# Patient Record
Sex: Female | Born: 1935
Health system: Southern US, Community
[De-identification: ages and names within clinical notes are randomized; demographics above are authoritative.]

## PROBLEM LIST (undated history)

## (undated) DIAGNOSIS — G459 Transient cerebral ischemic attack, unspecified: Secondary | ICD-10-CM

## (undated) DIAGNOSIS — K219 Gastro-esophageal reflux disease without esophagitis: Secondary | ICD-10-CM

## (undated) DIAGNOSIS — E785 Hyperlipidemia, unspecified: Secondary | ICD-10-CM

## (undated) DIAGNOSIS — I10 Essential (primary) hypertension: Secondary | ICD-10-CM

## (undated) HISTORY — PX: ABDOMINAL HYSTERECTOMY: SHX81

## (undated) HISTORY — PX: TONSILLECTOMY: SUR1361

---

## 1999-07-26 ENCOUNTER — Ambulatory Visit (HOSPITAL_COMMUNITY): Admission: RE | Admit: 1999-07-26 | Discharge: 1999-07-26 | Payer: Self-pay | Admitting: Family Medicine

## 1999-10-20 ENCOUNTER — Emergency Department (HOSPITAL_COMMUNITY): Admission: EM | Admit: 1999-10-20 | Discharge: 1999-10-20 | Payer: Self-pay

## 2001-09-10 ENCOUNTER — Encounter: Payer: Self-pay | Admitting: Family Medicine

## 2001-09-10 ENCOUNTER — Encounter: Admission: RE | Admit: 2001-09-10 | Discharge: 2001-09-10 | Payer: Self-pay | Admitting: Family Medicine

## 2005-06-05 ENCOUNTER — Encounter: Admission: RE | Admit: 2005-06-05 | Discharge: 2005-06-05 | Payer: Self-pay | Admitting: Family Medicine

## 2007-12-23 ENCOUNTER — Encounter: Admission: RE | Admit: 2007-12-23 | Discharge: 2007-12-23 | Payer: Self-pay | Admitting: Family Medicine

## 2009-05-05 ENCOUNTER — Ambulatory Visit: Payer: Self-pay | Admitting: Vascular Surgery

## 2011-04-11 NOTE — Consult Note (Signed)
NEW PATIENT CONSULTATION   Jill Hernandez, Jill Hernandez  DOB:  [birth year]                                       05/05/2009  CHART#:[id number]   Patient presents today for evaluation of lower extremity pathology.  She  is a very pleasant, active 77 year old white female who has several  components of lower extremity with guideposts.  She reports this is  bilateral but is more significant on her left than her right.  Her  complaints are mainly of numbness and tingling, more so in her left foot  than her right, and also a cold sensation in her left foot.  She does  not have any significant swelling.  She does have discomfort in her  calves at rest.  She reports that the numbness and tingling is in her  calf distally and can awaken her at night.   PAST MEDICAL HISTORY:  1. Hypertension.  2. Hyperlipidemia.  3. History of anemia.  4. Vitamin D deficiency.  5. Osteopenia.  6. Gastroesophageal reflux disease.  7. Osteoarthritis in her knees.   SOCIAL HISTORY:  She is widowed.  She has 3 children.  She does not  smoke, having quit 40 years ago, and she does drink wine with her  evening meal.   REVIEW OF SYSTEMS:  Otherwise negative other than above.   CURRENT MEDICATIONS:  1. Lisinopril.  2. Protonix.  3. Lipitor.  4. Tylenol.  5. Aspirin.  6. Multivitamin.  7. Boniva.   PHYSICAL EXAMINATION:  Blood pressure 144/76, pulse 88, respirations 18.  Her radial and dorsalis pedis pulses are 2+ bilaterally.  She does not  have any significant swelling in her lower extremities.  She does have  marked spider vein telangiectasia bilaterally, mostly on her thigh but  more so on her calves down onto her ankles.   She underwent a screening venous duplex by myself, and this did show  normal-caliber saphenous veins in her thighs.  She does have a lot of  early branching pattern with no significant varicosities in her  saphenous vein below her knees.   I discussed this at length  with the patient.  I explained that I do not  think her numbness is related to arterial or venous pathology.  I  explained that she does have normal arterial flow.  She does have some  inconvenience related to the coldness, and she has treated this  effectively with avoiding the cold and wearing warm socks.  I explained  that this is related to flow to the skin and not any correctable  arterial blockage, and she has normal arterial flow.  She was reassured  with this discussion and will see [place] again on an as-needed basis.   Larina Earthly, M.D.  Electronically Signed   TFE/MEDQ  D:  05/05/2009  T:  05/05/2009  Job:  [id number]   cc:   Addison Lank, NP at [facility]

## 2012-06-17 DIAGNOSIS — Z1331 Encounter for screening for depression: Secondary | ICD-10-CM | POA: Diagnosis not present

## 2012-06-17 DIAGNOSIS — E782 Mixed hyperlipidemia: Secondary | ICD-10-CM | POA: Diagnosis not present

## 2012-06-17 DIAGNOSIS — I10 Essential (primary) hypertension: Secondary | ICD-10-CM | POA: Diagnosis not present

## 2012-06-17 DIAGNOSIS — K219 Gastro-esophageal reflux disease without esophagitis: Secondary | ICD-10-CM | POA: Diagnosis not present

## 2012-06-17 DIAGNOSIS — M159 Polyosteoarthritis, unspecified: Secondary | ICD-10-CM | POA: Diagnosis not present

## 2012-08-16 DIAGNOSIS — R209 Unspecified disturbances of skin sensation: Secondary | ICD-10-CM | POA: Diagnosis not present

## 2012-09-04 DIAGNOSIS — Z23 Encounter for immunization: Secondary | ICD-10-CM | POA: Diagnosis not present

## 2013-02-17 DIAGNOSIS — N183 Chronic kidney disease, stage 3 unspecified: Secondary | ICD-10-CM | POA: Diagnosis not present

## 2013-02-17 DIAGNOSIS — E782 Mixed hyperlipidemia: Secondary | ICD-10-CM | POA: Diagnosis not present

## 2013-02-17 DIAGNOSIS — I10 Essential (primary) hypertension: Secondary | ICD-10-CM | POA: Diagnosis not present

## 2013-02-17 DIAGNOSIS — K219 Gastro-esophageal reflux disease without esophagitis: Secondary | ICD-10-CM | POA: Diagnosis not present

## 2013-08-14 DIAGNOSIS — Z23 Encounter for immunization: Secondary | ICD-10-CM | POA: Diagnosis not present

## 2013-08-18 DIAGNOSIS — K219 Gastro-esophageal reflux disease without esophagitis: Secondary | ICD-10-CM | POA: Diagnosis not present

## 2013-08-18 DIAGNOSIS — I10 Essential (primary) hypertension: Secondary | ICD-10-CM | POA: Diagnosis not present

## 2013-08-18 DIAGNOSIS — N183 Chronic kidney disease, stage 3 unspecified: Secondary | ICD-10-CM | POA: Diagnosis not present

## 2014-02-12 DIAGNOSIS — I10 Essential (primary) hypertension: Secondary | ICD-10-CM | POA: Diagnosis not present

## 2014-02-12 DIAGNOSIS — Z23 Encounter for immunization: Secondary | ICD-10-CM | POA: Diagnosis not present

## 2014-02-12 DIAGNOSIS — R7301 Impaired fasting glucose: Secondary | ICD-10-CM | POA: Diagnosis not present

## 2014-02-12 DIAGNOSIS — H00019 Hordeolum externum unspecified eye, unspecified eyelid: Secondary | ICD-10-CM | POA: Diagnosis not present

## 2014-02-12 DIAGNOSIS — E782 Mixed hyperlipidemia: Secondary | ICD-10-CM | POA: Diagnosis not present

## 2014-04-29 ENCOUNTER — Emergency Department (HOSPITAL_COMMUNITY): Payer: Medicare Other

## 2014-04-29 ENCOUNTER — Inpatient Hospital Stay (HOSPITAL_COMMUNITY): Payer: Medicare Other

## 2014-04-29 ENCOUNTER — Encounter (HOSPITAL_COMMUNITY): Payer: Self-pay | Admitting: Emergency Medicine

## 2014-04-29 ENCOUNTER — Inpatient Hospital Stay (HOSPITAL_COMMUNITY)
Admission: EM | Admit: 2014-04-29 | Discharge: 2014-05-01 | DRG: 312 | Disposition: A | Discharge status: Home / Self Care | Payer: Medicare Other | Attending: Internal Medicine | Admitting: Internal Medicine

## 2014-04-29 DIAGNOSIS — Z79899 Other long term (current) drug therapy: Secondary | ICD-10-CM | POA: Diagnosis not present

## 2014-04-29 DIAGNOSIS — M25569 Pain in unspecified knee: Secondary | ICD-10-CM

## 2014-04-29 DIAGNOSIS — S298XXA Other specified injuries of thorax, initial encounter: Secondary | ICD-10-CM | POA: Diagnosis not present

## 2014-04-29 DIAGNOSIS — R55 Syncope and collapse: Secondary | ICD-10-CM | POA: Diagnosis not present

## 2014-04-29 DIAGNOSIS — S8000XA Contusion of unspecified knee, initial encounter: Secondary | ICD-10-CM

## 2014-04-29 DIAGNOSIS — K219 Gastro-esophageal reflux disease without esophagitis: Secondary | ICD-10-CM | POA: Diagnosis present

## 2014-04-29 DIAGNOSIS — W19XXXA Unspecified fall, initial encounter: Secondary | ICD-10-CM | POA: Diagnosis present

## 2014-04-29 DIAGNOSIS — R404 Transient alteration of awareness: Secondary | ICD-10-CM | POA: Diagnosis not present

## 2014-04-29 DIAGNOSIS — E785 Hyperlipidemia, unspecified: Secondary | ICD-10-CM | POA: Diagnosis present

## 2014-04-29 DIAGNOSIS — R42 Dizziness and giddiness: Secondary | ICD-10-CM | POA: Diagnosis not present

## 2014-04-29 DIAGNOSIS — Z87891 Personal history of nicotine dependence: Secondary | ICD-10-CM | POA: Diagnosis not present

## 2014-04-29 DIAGNOSIS — G459 Transient cerebral ischemic attack, unspecified: Secondary | ICD-10-CM

## 2014-04-29 DIAGNOSIS — I10 Essential (primary) hypertension: Secondary | ICD-10-CM | POA: Diagnosis present

## 2014-04-29 DIAGNOSIS — S8990XA Unspecified injury of unspecified lower leg, initial encounter: Secondary | ICD-10-CM | POA: Diagnosis not present

## 2014-04-29 DIAGNOSIS — S8002XA Contusion of left knee, initial encounter: Secondary | ICD-10-CM

## 2014-04-29 DIAGNOSIS — S0990XA Unspecified injury of head, initial encounter: Secondary | ICD-10-CM | POA: Diagnosis not present

## 2014-04-29 DIAGNOSIS — S99919A Unspecified injury of unspecified ankle, initial encounter: Secondary | ICD-10-CM | POA: Diagnosis not present

## 2014-04-29 DIAGNOSIS — M25562 Pain in left knee: Secondary | ICD-10-CM | POA: Diagnosis present

## 2014-04-29 DIAGNOSIS — R079 Chest pain, unspecified: Secondary | ICD-10-CM | POA: Diagnosis not present

## 2014-04-29 HISTORY — DX: Gastro-esophageal reflux disease without esophagitis: K21.9

## 2014-04-29 HISTORY — DX: Essential (primary) hypertension: I10

## 2014-04-29 HISTORY — DX: Hyperlipidemia, unspecified: E78.5

## 2014-04-29 LAB — CBC WITH DIFFERENTIAL/PLATELET
Basophils Absolute: 0 10*3/uL (ref 0.0–0.1)
Basophils Relative: 0 % (ref 0–1)
EOS PCT: 1 % (ref 0–5)
Eosinophils Absolute: 0.1 10*3/uL (ref 0.0–0.7)
HEMATOCRIT: 43.5 % (ref 36.0–46.0)
HEMOGLOBIN: 14.5 g/dL (ref 12.0–15.0)
LYMPHS PCT: 14 % (ref 12–46)
Lymphs Abs: 1.6 10*3/uL (ref 0.7–4.0)
MCH: 34.8 pg — ABNORMAL HIGH (ref 26.0–34.0)
MCHC: 33.3 g/dL (ref 30.0–36.0)
MCV: 104.3 fL — AB (ref 78.0–100.0)
MONOS PCT: 7 % (ref 3–12)
Monocytes Absolute: 0.7 10*3/uL (ref 0.1–1.0)
Neutro Abs: 8.9 10*3/uL — ABNORMAL HIGH (ref 1.7–7.7)
Neutrophils Relative %: 78 % — ABNORMAL HIGH (ref 43–77)
Platelets: 222 10*3/uL (ref 150–400)
RBC: 4.17 MIL/uL (ref 3.87–5.11)
RDW: 12.7 % (ref 11.5–15.5)
WBC: 11.3 10*3/uL — AB (ref 4.0–10.5)

## 2014-04-29 LAB — URINALYSIS, ROUTINE W REFLEX MICROSCOPIC
Bilirubin Urine: NEGATIVE
Glucose, UA: NEGATIVE mg/dL
HGB URINE DIPSTICK: NEGATIVE
Ketones, ur: NEGATIVE mg/dL
NITRITE: NEGATIVE
PROTEIN: NEGATIVE mg/dL
SPECIFIC GRAVITY, URINE: 1.009 (ref 1.005–1.030)
UROBILINOGEN UA: 0.2 mg/dL (ref 0.0–1.0)
pH: 6 (ref 5.0–8.0)

## 2014-04-29 LAB — CBG MONITORING, ED: Glucose-Capillary: 117 mg/dL — ABNORMAL HIGH (ref 70–99)

## 2014-04-29 LAB — CBC
HEMATOCRIT: 41.9 % (ref 36.0–46.0)
HEMOGLOBIN: 14.1 g/dL (ref 12.0–15.0)
MCH: 34.8 pg — AB (ref 26.0–34.0)
MCHC: 33.7 g/dL (ref 30.0–36.0)
MCV: 103.5 fL — AB (ref 78.0–100.0)
Platelets: 193 10*3/uL (ref 150–400)
RBC: 4.05 MIL/uL (ref 3.87–5.11)
RDW: 12.8 % (ref 11.5–15.5)
WBC: 13.5 10*3/uL — ABNORMAL HIGH (ref 4.0–10.5)

## 2014-04-29 LAB — COMPREHENSIVE METABOLIC PANEL
ALT: 22 U/L (ref 0–35)
AST: 28 U/L (ref 0–37)
Albumin: 3.7 g/dL (ref 3.5–5.2)
Alkaline Phosphatase: 56 U/L (ref 39–117)
BILIRUBIN TOTAL: 1 mg/dL (ref 0.3–1.2)
BUN: 19 mg/dL (ref 6–23)
CALCIUM: 10 mg/dL (ref 8.4–10.5)
CHLORIDE: 98 meq/L (ref 96–112)
CO2: 25 meq/L (ref 19–32)
CREATININE: 1.08 mg/dL (ref 0.50–1.10)
GFR, EST AFRICAN AMERICAN: 54 mL/min — AB (ref >90)
GFR, EST NON AFRICAN AMERICAN: 47 mL/min — AB (ref >90)
GLUCOSE: 121 mg/dL — AB (ref 70–99)
Potassium: 4.6 mEq/L (ref 3.7–5.3)
Sodium: 136 mEq/L — ABNORMAL LOW (ref 137–147)
Total Protein: 7.7 g/dL (ref 6.0–8.3)

## 2014-04-29 LAB — URINE MICROSCOPIC-ADD ON

## 2014-04-29 LAB — PRO B NATRIURETIC PEPTIDE: PRO B NATRI PEPTIDE: 166.8 pg/mL (ref 0–450)

## 2014-04-29 LAB — CREATININE, SERUM
Creatinine, Ser: 0.93 mg/dL (ref 0.50–1.10)
GFR calc Af Amer: 65 mL/min — ABNORMAL LOW (ref >90)
GFR, EST NON AFRICAN AMERICAN: 56 mL/min — AB (ref >90)

## 2014-04-29 LAB — TROPONIN I: Troponin I: <0.3 ng/mL (ref <0.30)

## 2014-04-29 LAB — TSH: TSH: 1.3 u[IU]/mL (ref 0.350–4.500)

## 2014-04-29 MED ORDER — SIMVASTATIN 10 MG PO TABS
10.0000 mg | ORAL_TABLET | Freq: Every day | ORAL | Status: DC
  Administered 2014-04-30: 10 mg via ORAL
  Filled 2014-04-29 (×2): qty 1

## 2014-04-29 MED ORDER — ENOXAPARIN SODIUM 40 MG/0.4ML ~~LOC~~ SOLN
40.0000 mg | SUBCUTANEOUS | Status: DC
  Administered 2014-04-29 – 2014-04-30 (×2): 40 mg via SUBCUTANEOUS
  Filled 2014-04-29 (×3): qty 0.4

## 2014-04-29 MED ORDER — ACETAMINOPHEN 325 MG PO TABS
650.0000 mg | ORAL_TABLET | Freq: Four times a day (QID) | ORAL | Status: DC | PRN
  Administered 2014-04-29 – 2014-05-01 (×4): 650 mg via ORAL
  Filled 2014-04-29 (×4): qty 2

## 2014-04-29 MED ORDER — SODIUM CHLORIDE 0.9 % IV SOLN
INTRAVENOUS | Status: DC
  Administered 2014-04-29 (×2): via INTRAVENOUS

## 2014-04-29 MED ORDER — ACETAMINOPHEN 650 MG RE SUPP: 650.0000 mg | Freq: Four times a day (QID) | RECTAL | Status: DC | PRN

## 2014-04-29 MED ORDER — ASPIRIN EC 325 MG PO TBEC
325.0000 mg | DELAYED_RELEASE_TABLET | Freq: Every day | ORAL | Status: DC
Start: 2014-04-30 — End: 2014-05-01
  Administered 2014-04-30 – 2014-05-01 (×2): 325 mg via ORAL
  Filled 2014-04-29 (×2): qty 1

## 2014-04-29 MED ORDER — ASPIRIN EC 81 MG PO TBEC
81.0000 mg | DELAYED_RELEASE_TABLET | Freq: Every day | ORAL | Status: DC
  Administered 2014-04-29: 81 mg via ORAL
  Filled 2014-04-29: qty 1

## 2014-04-29 NOTE — ED Notes (Signed)
[facility] at bedside 

## 2014-04-29 NOTE — ED Notes (Signed)
Bed: XY58 Expected date:  Expected time:  Means of arrival:  Comments: EMS- fall, orthostatic, knee pain

## 2014-04-29 NOTE — ED Notes (Signed)
MD at bedside. 

## 2014-04-29 NOTE — ED Provider Notes (Signed)
CSN: [id number]     Arrival date & time 04/29/14  1238 History   First MD Initiated Contact with Patient 04/29/14 1338     Chief Complaint  Patient presents with  . Loss of Consciousness  . Fall  . Nausea  . Emesis  . Diarrhea     (Consider location/radiation/quality/duration/timing/severity/associated sxs/prior Treatment) HPI 80 year old female presents with syncope about 4-1/2 hours ago. She states that she was sitting in her recliner and noticed acute onset of dizziness and felt like she's got a pass out. When she stood up she thinks she did pass out. She woke up with a bruise on her left knee. She thinks she was out for only a couple seconds. She denies any palpitations, chest pain, or shortness of breath. She did notice that she vomited at some point during the syncope. Today she had a loose bowel movement as well. She's not had any B. symptoms prior to the syncope. Prior to that she been feeling like her normal self. She has been feeling left arm numbness and tingling since the incident about 4 hours ago. Denies any weakness. No blurry vision or headaches. No urinary symptoms or abdominal pain.  Past Medical History  Diagnosis Date  . Hypertension   . Hyperlipemia    No past surgical history on file. No family history on file. History  Substance Use Topics  . Smoking status: Not on file  . Smokeless tobacco: Not on file  . Alcohol Use: Not on file   OB History   Grav Para Term Preterm Abortions TAB SAB Ect Mult Living                 Review of Systems  Respiratory: Negative for shortness of breath.   Cardiovascular: Negative for chest pain.  Gastrointestinal: Positive for vomiting and diarrhea. Negative for nausea and abdominal pain.  Genitourinary: Negative for dysuria.  Neurological: Positive for dizziness, syncope and numbness. Negative for weakness and headaches.  All other systems reviewed and are negative.     Allergies  Sulfa antibiotics  Home Medications    Prior to Admission medications   Medication Sig Start Date End Date Taking? Authorizing Provider  carvedilol (COREG) 6.25 MG tablet Take 6.25 mg by mouth 2 (two) times daily with a meal.   Yes Historical Provider, MD  lisinopril (PRINIVIL,ZESTRIL) 20 MG tablet Take 20 mg by mouth daily.   Yes Historical Provider, MD  lovastatin (MEVACOR) 20 MG tablet Take 20 mg by mouth at bedtime.   Yes Historical Provider, MD  ranitidine (ZANTAC) 150 MG tablet Take 150 mg by mouth 2 (two) times daily.   Yes Historical Provider, MD  trolamine salicylate (ASPERCREME) 10 % cream Apply 1 application topically as needed for muscle pain. Apply's to knee's   Yes Historical Provider, MD   BP 136/65  Pulse 71  Temp(Src) 97.6 F (36.4 C) (Oral)  Resp 20  SpO2 97% Physical Exam  Nursing note and vitals reviewed. Constitutional: She is oriented to person, place, and time. She appears well-developed and well-nourished.  HENT:  Head: Normocephalic and atraumatic.  Right Ear: External ear normal.  Left Ear: External ear normal.  Nose: Nose normal.  Eyes: Right eye exhibits no discharge. Left eye exhibits no discharge.  Cardiovascular: Normal rate, regular rhythm and normal heart sounds.   No murmur heard. Pulmonary/Chest: Effort normal and breath sounds normal.  Abdominal: Soft. There is tenderness (mild) in the left lower quadrant.  Musculoskeletal:  Left knee: She exhibits ecchymosis. She exhibits no deformity and no laceration. Tenderness found.  Neurological: She is alert and oriented to person, place, and time. Coordination normal.  Normal strength in all 4 extremities. CN 2-12 grossly intact. Normal Finger to nose testing  Skin: Skin is warm and dry.    ED Course  Procedures (including critical care time) Labs Review Labs Reviewed  CBC WITH DIFFERENTIAL - Abnormal; Notable for the following:    WBC 11.3 (*)    MCV 104.3 (*)    MCH 34.8 (*)    Neutrophils Relative % 78 (*)    Neutro Abs  8.9 (*)    All other components within normal limits  COMPREHENSIVE METABOLIC PANEL - Abnormal; Notable for the following:    Sodium 136 (*)    Glucose, Bld 121 (*)    GFR calc non Af Amer 47 (*)    GFR calc Af Amer 54 (*)    All other components within normal limits  URINALYSIS, ROUTINE W REFLEX MICROSCOPIC - Abnormal; Notable for the following:    Leukocytes, UA TRACE (*)    All other components within normal limits  CBG MONITORING, ED - Abnormal; Notable for the following:    Glucose-Capillary 117 (*)    All other components within normal limits  TROPONIN I  URINE MICROSCOPIC-ADD ON  CBG MONITORING, ED    Imaging Review Dg Chest 2 View  04/29/2014   CLINICAL DATA:  Loss of consciousness; pain post trauma  EXAM: CHEST  2 VIEW  COMPARISON:  None.  FINDINGS: Lungs are clear. Heart size and pulmonary vascularity are normal. No adenopathy. No pneumothorax. No bone lesions. There is atherosclerotic change in the aorta.  IMPRESSION: No pneumothorax.  No edema or consolidation.   Electronically Signed   By: Bretta Bang M.D.   On: 04/29/2014 15:19   Ct Head Wo Contrast  04/29/2014   CLINICAL DATA:  Dizziness and syncope and unwitnessed fall with emesis ; subsequent diarrhea.  EXAM: CT HEAD WITHOUT CONTRAST  TECHNIQUE: Contiguous axial images were obtained from the base of the skull through the vertex without intravenous contrast.  COMPARISON:  None.  FINDINGS: There is mild age-appropriate diffuse cerebral and cerebellar atrophy with compensatory ventriculomegaly. There is no intracranial hemorrhage nor intracranial mass effect. There is mildly decreased density in the deep white matter of both cerebral hemispheres consistent with chronic small vessel ischemic type change. The cerebellum and brainstem exhibit normal density.  At bone window settings the observed portions of the paranasal sinuses and mastoid air cells are clear. There is no acute skull fracture.  IMPRESSION: 1. No acute  intracranial abnormality is demonstrated. 2. There are age related changes of chronic small vessel ischemia. 3. There is no acute skull fracture.   Electronically Signed   By: David  [place]   On: 04/29/2014 14:14   Dg Knee Complete 4 Views Left  04/29/2014   CLINICAL DATA:  Fall, anterior knee pain  EXAM: LEFT KNEE - COMPLETE 4+ VIEW  COMPARISON:  None.  FINDINGS: No fracture or dislocation is seen.  Moderate tricompartmental degenerative changes, most prominent in the medial compartment.  The visualized soft tissues are unremarkable.  Possible small suprapatellar knee joint effusion.  IMPRESSION: No fracture or dislocation is seen.  Moderate degenerative changes with possible small suprapatellar knee joint effusion.   Electronically Signed   By: Charline Bills M.D.   On: 04/29/2014 15:21     EKG Interpretation   Date/Time:  Wednesday April 29 2014 12:56:19 EDT Ventricular Rate:  68 PR Interval:  122 QRS Duration: 91 QT Interval:  394 QTC Calculation: 419 R Axis:   53 Text Interpretation:  Sinus rhythm Abnormal R-wave progression, early  transition Otherwise normal ECG No old tracing to compare Confirmed by  Aislin Onofre  MD, Elenor Wildes ([id number]) on 04/29/2014 1:00:23 PM      MDM   Final diagnoses:  Syncope  Contusion of left knee    Patient with dizziness that sounds like near-syncope that led to her passing out. As this started while sitting down with no predisposing factors this is concerning for more serious etiology. She has vague numbness in left arm but states there may be some in right hand but not as bad. No evidence of stroke on exam. Her symptoms gradually resolved. No evidence of MI at this time. Given her age and concerning story, she will need observation in hospital and syncope workup. Will admit to hospitalist.    Audree CamelScott T Chassidy Layson, MD 04/29/14 1600

## 2014-04-29 NOTE — ED Notes (Signed)
MD at bedside. EDP Criss Alvine present to evaluate this pt

## 2014-04-29 NOTE — ED Notes (Signed)
Patient transported to MRI 

## 2014-04-29 NOTE — ED Notes (Signed)
Family at bedside. 

## 2014-04-29 NOTE — ED Notes (Signed)
Per [facility]_  Pt sitting felt dizzy and stood up and passed out. Found herself on the floor with vomit. Got herself up and then had diarrhea. Denies CP/neck and back pain. C/o of left knee pain only. IV established 200NS in route. Standing BP 88 palpable. BP after bolus 119/62 sitting. GCS 15 - Neuro intact.

## 2014-04-29 NOTE — ED Notes (Signed)
Pt returned from MRI °

## 2014-04-29 NOTE — ED Provider Notes (Signed)
Pt alert, content. Eating crackers. Nad. Vitals stable.  Pt appears stable for transfer to [facility].    Suzi Roots, MD 04/29/14 [id number]

## 2014-04-29 NOTE — ED Notes (Signed)
Patient transported to CT 

## 2014-04-29 NOTE — H&P (Signed)
[facility] History and Physical  Jill Hernandez ZOX:[id number] DOB: 01/03/1930 DOA: 04/29/2014  Referring physician: PCP: No primary provider on file.  Specialists:   Chief Complaint: Syncope vs TIA vs seizure  HPI: Jill Hernandez is a 80 y.o. WF PMHx Presents with syncope about 4-1/2 hours ago. She states that she was sitting in her recliner and noticed acute onset of dizziness (feeling lightheaded) and felt like she's got a pass out. When she stood up she thinks she did pass out, positive LOC<5 minute. She woke up with a bruise on her left knee. She denies any palpitations, chest pain, or shortness of breath. She did notice that she vomited at some point during the syncope. Today she had a loose bowel movement as well. She's not had any B. symptoms prior to the syncope. Prior to that she been feeling like her normal self. She has been feeling left arm numbness and tingling since the incident about 4 hours ago. Denies any weakness. No blurry vision or headaches. No urinary symptoms or abdominal pain. States negative history of seizures, negative MI history, negative CVA history.     Review of Systems: The patient denies anorexia, fever, weight loss,, vision loss, decreased hearing, hoarseness, chest pain, dyspnea on exertion, peripheral edema, balance deficits, hemoptysis, abdominal pain, melena, hematochezia, severe indigestion/heartburn, hematuria, incontinence, genital sores, muscle weakness, suspicious skin lesions, transient blindness, depression, unusual weight change, abnormal bleeding, enlarged lymph nodes, angioedema, and breast masses.    TRAVEL HISTORY: None     Consultants:  NA  Procedure/Significant Events:  6/3 MRI Brain; No acute intracranial abnormality. Mild for age chronic small vessel disease. 6/3 CT Head w/o contrast No acute intracranial abnormality. Age related changes of chronic small vessel ischemia.  6/3 DG left knee; Moderate tricompartmental  degenerative changes, most prominent medial compartment. -Negative fracture, small suprapatellar knee joint effusion     Culture  6/3 urine pending 6/3 blood pending   Antibiotics:  NA   DVT prophylaxis:  Lovenox  Devices  NA   LINES / TUBES:  6/3 20ga left antecubital 6/3 20ga left wrist   Past Medical History  Diagnosis Date  . Hypertension   . Hyperlipemia   . GERD (gastroesophageal reflux disease)    Past Surgical History  Procedure Laterality Date  . Abdominal hysterectomy    . Tonsillectomy     Social History:  reports that she quit smoking about 51 years ago. Her smoking use included Cigarettes. She smoked 0.00 packs per day for 20 years. She has never used smokeless tobacco. She reports that she drinks alcohol. She reports that she does not use illicit drugs.   Allergies  Allergen Reactions  . Sulfa Antibiotics Hives    History reviewed. No pertinent family history.   Prior to Admission medications   Medication Sig Start Date End Date Taking? Authorizing Provider  carvedilol (COREG) 6.25 MG tablet Take 6.25 mg by mouth 2 (two) times daily with a meal.   Yes Historical Provider, MD  lisinopril (PRINIVIL,ZESTRIL) 20 MG tablet Take 20 mg by mouth daily.   Yes Historical Provider, MD  lovastatin (MEVACOR) 20 MG tablet Take 20 mg by mouth at bedtime.   Yes Historical Provider, MD  ranitidine (ZANTAC) 150 MG tablet Take 150 mg by mouth 2 (two) times daily.   Yes Historical Provider, MD  trolamine salicylate (ASPERCREME) 10 % cream Apply 1 application topically as needed for muscle pain. Apply's to knee's   Yes Historical Provider, MD   Physical  Exam: Filed Vitals:   04/29/14 1725 04/29/14 1857 04/29/14 1900 04/29/14 2055  BP:  154/67 139/46   Pulse:  77 74   Temp: 98.1 F (36.7 C)   98 F (36.7 C)  TempSrc:    Oral  Resp:  18 18   Height:    5\' 3"  (1.6 m)  Weight:    76.839 kg (169 lb 6.4 oz)  SpO2:  96% 98% 97%     General:  A./O. x4,  NAD  Eyes: Pupils equal reactive to light and accommodation  Neck: Negative JVD, negative lymphadenopathy  Cardiovascular: Regular rhythm and rate, negative murmurs rubs or gallops, normal S1/S2  Respiratory: Clear to auscultation bilateral  Abdomen: Soft, nontender, nondistended, plus bowel sound  Skin: Small laceration on left knee, small bite mark to left for itch of tongue  Musculoskeletal: Left knee swollen, erythematous, tender to touch along lateral/medial joint line  Neurologic: Cranial nerve II through XII intact, tongue/uvula midline, all extremity strength 5/5, sensation intact throughout, quick finger touch within normal limits bilateral, finger-nose-finger within normal limits bilateral, did not ambulate patient.  Labs on Admission:  Basic Metabolic Panel:  Recent Labs Lab 04/29/14 1336 04/29/14 1626  NA 136*  --   K 4.6  --   CL 98  --   CO2 25  --   GLUCOSE 121*  --   BUN 19  --   CREATININE 1.08 0.93  CALCIUM 10.0  --    Liver Function Tests:  Recent Labs Lab 04/29/14 1336  AST 28  ALT 22  ALKPHOS 56  BILITOT 1.0  PROT 7.7  ALBUMIN 3.7   No results found for this basename: LIPASE, AMYLASE,  in the last 168 hours No results found for this basename: AMMONIA,  in the last 168 hours CBC:  Recent Labs Lab 04/29/14 1336 04/29/14 1626  WBC 11.3* 13.5*  NEUTROABS 8.9*  --   HGB 14.5 14.1  HCT 43.5 41.9  MCV 104.3* 103.5*  PLT 222 193   Cardiac Enzymes:  Recent Labs Lab 04/29/14 1336 04/29/14 1626  TROPONINI <0.30 <0.30    BNP (last 3 results)  Recent Labs  04/29/14 1628  PROBNP 166.8   CBG:  Recent Labs Lab 04/29/14 1305  GLUCAP 117*    Radiological Exams on Admission: Dg Chest 2 View  04/29/2014   CLINICAL DATA:  Loss of consciousness; pain post trauma  EXAM: CHEST  2 VIEW  COMPARISON:  None.  FINDINGS: Lungs are clear. Heart size and pulmonary vascularity are normal. No adenopathy. No pneumothorax. No bone lesions. There  is atherosclerotic change in the aorta.  IMPRESSION: No pneumothorax.  No edema or consolidation.   Electronically Signed   By: Bretta Bang M.D.   On: 04/29/2014 15:19   Ct Head Wo Contrast  04/29/2014   CLINICAL DATA:  Dizziness and syncope and unwitnessed fall with emesis ; subsequent diarrhea.  EXAM: CT HEAD WITHOUT CONTRAST  TECHNIQUE: Contiguous axial images were obtained from the base of the skull through the vertex without intravenous contrast.  COMPARISON:  None.  FINDINGS: There is mild age-appropriate diffuse cerebral and cerebellar atrophy with compensatory ventriculomegaly. There is no intracranial hemorrhage nor intracranial mass effect. There is mildly decreased density in the deep white matter of both cerebral hemispheres consistent with chronic small vessel ischemic type change. The cerebellum and brainstem exhibit normal density.  At bone window settings the observed portions of the paranasal sinuses and mastoid air cells are clear. There is no  acute skull fracture.  IMPRESSION: 1. No acute intracranial abnormality is demonstrated. 2. There are age related changes of chronic small vessel ischemia. 3. There is no acute skull fracture.   Electronically Signed   By: David  [place]   On: 04/29/2014 14:14   Mr Brain Wo Contrast  04/29/2014   CLINICAL DATA:  80 year old female with persistent dizziness, loss of consciousness, vomiting, left arm numbness. Initial encounter.  EXAM: MRI HEAD WITHOUT CONTRAST  TECHNIQUE: Multiplanar, multiecho pulse sequences of the brain and surrounding structures were obtained without intravenous contrast.  COMPARISON:  Head CT without contrast 1409 hr the same day.  FINDINGS: Cerebral volume is within normal limits for age. No restricted diffusion to suggest acute infarction. No midline shift, mass effect, evidence of mass lesion, ventriculomegaly, extra-axial collection or acute intracranial hemorrhage. Cervicomedullary junction and pituitary are within normal  limits. Major intracranial vascular flow voids are preserved. Minimal to mild for age nonspecific cerebral white matter T2 and FLAIR hyperintensity. No cortical encephalomalacia. Tiny chronic micro hemorrhage in the medial right cerebellum on series 8, image 7. Otherwise negative brainstem, cerebellum, and visualized internal auditory structures.  Visualized orbit soft tissues are within normal limits. Mild paranasal sinus mucosal thickening. Normal bone marrow signal. Visualized scalp soft tissues are within normal limits.  IMPRESSION: No acute intracranial abnormality. Mild for age chronic small vessel disease.   Electronically Signed   By: Augusto GambleLee  Hall M.D.   On: 04/29/2014 19:01   Dg Knee Complete 4 Views Left  04/29/2014   CLINICAL DATA:  Fall, anterior knee pain  EXAM: LEFT KNEE - COMPLETE 4+ VIEW  COMPARISON:  None.  FINDINGS: No fracture or dislocation is seen.  Moderate tricompartmental degenerative changes, most prominent in the medial compartment.  The visualized soft tissues are unremarkable.  Possible small suprapatellar knee joint effusion.  IMPRESSION: No fracture or dislocation is seen.  Moderate degenerative changes with possible small suprapatellar knee joint effusion.   Electronically Signed   By: Charline BillsSriyesh  Krishnan M.D.   On: 04/29/2014 15:21    EKG: NSR, interventricular early repolarization  Assessment/Plan Active Problems:   Syncope   TIA (transient ischemic attack)   HTN (hypertension)   HLD (hyperlipidemia)   Left knee pain  Syncope/seizure? -Negative history of seizure, upon further conversation with the patient and daughter sounds more like a orthostatic hypotension. -Will hold all BP medication (Coreg 6.25 mg, lisinopril 20 mg) -Obtain echocardiogram -Obtain troponin x3, BNP -Obtain TSH  TIA -CT head negative for acute infarct -Brain MRI; negative for CVA, which does not rule out TIA -Aspirin 325 mg  HTN -Will hold all BP medication (Coreg 6.25 mg, lisinopril 20  mg), secondary to possible orthostatic hypotension (per patient was significantly hypotensive when EMTs arrived).  HLD -Obtain lipid panel -Patient on lovastatin 20 mg at home will substitute Zocor 10 mg daily  Left knee bruise -Ice pack QID -Consult PT/OT placed    Code Status:  full  Family Communication: Daughter at bedside Disposition Plan: Completion syncope workup  Time spent: 60 minute  Drema DallasCurtis J Tadao Emig [facility] Pager [phone]  If 7PM-7AM, please contact night-coverage [email or web address] Password [email or web address] 04/29/2014, 10:08 PM

## 2014-04-30 ENCOUNTER — Encounter (HOSPITAL_COMMUNITY): Payer: Self-pay | Admitting: Neurology

## 2014-04-30 ENCOUNTER — Inpatient Hospital Stay (HOSPITAL_COMMUNITY): Payer: Medicare Other

## 2014-04-30 DIAGNOSIS — S8000XA Contusion of unspecified knee, initial encounter: Secondary | ICD-10-CM | POA: Diagnosis not present

## 2014-04-30 DIAGNOSIS — R55 Syncope and collapse: Secondary | ICD-10-CM | POA: Diagnosis not present

## 2014-04-30 DIAGNOSIS — I10 Essential (primary) hypertension: Secondary | ICD-10-CM | POA: Diagnosis not present

## 2014-04-30 DIAGNOSIS — E785 Hyperlipidemia, unspecified: Secondary | ICD-10-CM | POA: Diagnosis not present

## 2014-04-30 LAB — URINE CULTURE: Colony Count: 30000

## 2014-04-30 MED ORDER — PANTOPRAZOLE SODIUM 40 MG PO TBEC
40.0000 mg | DELAYED_RELEASE_TABLET | Freq: Every day | ORAL | Status: DC
  Administered 2014-04-30 – 2014-05-01 (×2): 40 mg via ORAL
  Filled 2014-04-30 (×2): qty 1

## 2014-04-30 MED ORDER — CARVEDILOL 6.25 MG PO TABS
6.2500 mg | ORAL_TABLET | Freq: Two times a day (BID) | ORAL | Status: DC
  Administered 2014-04-30 – 2014-05-01 (×3): 6.25 mg via ORAL
  Filled 2014-04-30 (×5): qty 1

## 2014-04-30 NOTE — Evaluation (Signed)
Physical Therapy Evaluation Patient Details Name: Jill Hernandez MRN: [id number] DOB: Apr 20, 1930 Today's Date: 04/30/2014   History of Present Illness  Pt is 80 y.o Female admitted 04/29/14 for syncopal episode. Pt reports onset of dizziness when sitting in recliner, stood up, and positive LOC <25minutes. Pt awoke with bruise on Lt knee and had vomited. No symptoms prior to incident. Negative MRI and CT for acute changes.   Clinical Impression  Pt is at baseline functioning at mod I level of mobility.  It is appropriate that she uses her RW due to painful L Knee.  Pt will be safe at home without any further intervention.  Sign off from PT.    Follow Up Recommendations No PT follow up    Equipment Recommendations       Recommendations for Other Services       Precautions / Restrictions Precautions Precautions: Fall Restrictions Weight Bearing Restrictions: No      Mobility  Bed Mobility Overal bed mobility: Modified Independent             General bed mobility comments: Not assessed, pt sitting in recliner before/after OT.   Transfers Overall transfer level: Needs assistance Equipment used: Rolling walker (2 wheeled) Transfers: Sit to/from Stand Sit to Stand: Supervision         General transfer comment: No verbal cues for hand placement, pt with good technique. Supervision due to recent syncope, however pt reports no dizziness upon standing.  Ambulation/Gait Ambulation/Gait assistance: Supervision Ambulation Distance (Feet): 150 Feet Assistive device: Rolling walker (2 wheeled) Gait Pattern/deviations: WFL(Within Functional Limits)   Gait velocity interpretation: Below normal speed for age/gender General Gait Details: steady and slower, but safe  Stairs            Wheelchair Mobility    Modified Rankin (Stroke Patients Only)       Balance Overall balance assessment: No apparent balance deficits (not formally assessed)                                            Pertinent Vitals/Pain     Home Living Family/patient expects to be discharged to:: Assisted living Living Arrangements: Alone             Home Equipment: Walker - 2 wheels;Shower seat - built in Additional Comments: Pt's apartment has call bell to alert staff in emergency. Apt is handicap accessible.    Prior Function Level of Independence: Independent with assistive device(s)               Hand Dominance   Dominant Hand: Right    Extremity/Trunk Assessment   Upper Extremity Assessment: Overall WFL for tasks assessed           Lower Extremity Assessment: Overall WFL for tasks assessed      Cervical / Trunk Assessment: Normal  Communication   Communication: HOH  Cognition Arousal/Alertness: Awake/alert Behavior During Therapy: WFL for tasks assessed/performed Overall Cognitive Status: Within Functional Limits for tasks assessed                      General Comments      Exercises        Assessment/Plan    PT Assessment Patent does not need any further PT services  PT Diagnosis     PT Problem List Decreased activity tolerance;Pain  PT Treatment Interventions  PT Goals (Current goals can be found in the Care Plan section) Acute Rehab PT Goals PT Goal Formulation: No goals set, d/c therapy    Frequency     Barriers to discharge        Co-evaluation               End of Session   Activity Tolerance: Patient tolerated treatment well Patient left: in bed;with call bell/phone within reach Nurse Communication: Mobility status         Time: 1353-1410 PT Time Calculation (min): 17 min   Charges:   PT Evaluation $Initial PT Evaluation Tier I: 1 Procedure     PT G Codes:          Jill Hernandez V 04/30/2014, 2:15 PM 04/30/2014  Jill Hernandez, PT [phone]  (pager)

## 2014-04-30 NOTE — Progress Notes (Signed)
Patient Demographics  Jill Hernandez, is a 80 y.o. female, DOB - 1930/07/08, ZOX:[id number]  Admit date - 04/29/2014   Admitting Physician Drema Dallas, MD  Outpatient Primary MD for the patient is No primary provider on file.  LOS - 1   Chief Complaint  Patient presents with  . Loss of Consciousness  . Fall  . Nausea  . Emesis  . Diarrhea           Subjective:   Jill Hernandez today has, No headache, No chest pain, No abdominal pain - No Nausea, No new weakness tingling or numbness, No Cough - SOB.      Assessment & Plan    1. Syncope - highly suspicious for seizure, she has a tongue bite and had stool incontinence with the episode. CT head and MRI brain unremarkable, echo carotid pending, doubt this was TIA. On aspirin which will be continued along with statin. Monitor orthostatics which are negative thus far. EEG ordered, will have neuro evaluate. Increase activity. PT to eval.    2. Hypertension. Resume Coreg and monitor.    3. Dyslipidemia. On statin continue    4. Fall with left knee pain. X-ray stable with chronic changes, supportive care, ice pack.    5. GERD. On PPI continue.      Code Status: Full  Family Communication: None present  Disposition Plan: Home   Procedures CT head, MRI brain, chest x-ray, left knee x-ray, EEG   Consults  neurology   Medications  Scheduled Meds: . aspirin EC  325 mg Oral Daily  . enoxaparin (LOVENOX) injection  40 mg Subcutaneous Q24H  . pantoprazole  40 mg Oral Daily  . simvastatin  10 mg Oral q1800   Continuous Infusions: . sodium chloride 75 mL/hr at 04/29/14 2040   PRN Meds:.acetaminophen, acetaminophen  DVT Prophylaxis  Lovenox    Lab Results  Component Value Date   PLT 193 04/29/2014    Antibiotics       Anti-infectives   None          Objective:   Filed Vitals:   04/29/14 1900 04/29/14 2055 04/30/14 0130 04/30/14 0541  BP: 139/46  147/82 166/82  Pulse: 74  70 74  Temp:  98 F (36.7 C) 98.4 F (36.9 C) 98.2 F (36.8 C)  TempSrc:  Oral Oral Oral  Resp: 18  18 18   Height:  5\' 3"  (1.6 m)    Weight:  76.839 kg (169 lb 6.4 oz)    SpO2: 98% 97% 94% 96%    Wt Readings from Last 3 Encounters:  04/29/14 76.839 kg (169 lb 6.4 oz)     Intake/Output Summary (Last 24 hours) at 04/30/14 1005 Last data filed at 04/29/14 1355  Gross per 24 hour  Intake    500 ml  Output      0 ml  Net    500 ml     Physical Exam  Awake Alert, Oriented X 3, No new F.N deficits, Normal affect, hard of hearing [place].AT,PERRAL Supple Neck,No JVD, No cervical lymphadenopathy appriciated.  Symmetrical Chest wall movement, Good air movement bilaterally, CTAB RRR,No Gallops,Rubs or new Murmurs, No Parasternal Heave +ve B.Sounds, Abd Soft, No tenderness, No organomegaly appriciated, No rebound -  guarding or rigidity. No Cyanosis, Clubbing or edema, No new Rash or bruise      Data Review   Micro Results No results found for this or any previous visit (from the past 240 hour(s)).  Radiology Reports Dg Chest 2 View  04/29/2014   CLINICAL DATA:  Loss of consciousness; pain post trauma  EXAM: CHEST  2 VIEW  COMPARISON:  None.  FINDINGS: Lungs are clear. Heart size and pulmonary vascularity are normal. No adenopathy. No pneumothorax. No bone lesions. There is atherosclerotic change in the aorta.  IMPRESSION: No pneumothorax.  No edema or consolidation.   Electronically Signed   By: Bretta Bang M.D.   On: 04/29/2014 15:19   Ct Head Wo Contrast  04/29/2014   CLINICAL DATA:  Dizziness and syncope and unwitnessed fall with emesis ; subsequent diarrhea.  EXAM: CT HEAD WITHOUT CONTRAST  TECHNIQUE: Contiguous axial images were obtained from the base of the skull through the vertex without intravenous  contrast.  COMPARISON:  None.  FINDINGS: There is mild age-appropriate diffuse cerebral and cerebellar atrophy with compensatory ventriculomegaly. There is no intracranial hemorrhage nor intracranial mass effect. There is mildly decreased density in the deep white matter of both cerebral hemispheres consistent with chronic small vessel ischemic type change. The cerebellum and brainstem exhibit normal density.  At bone window settings the observed portions of the paranasal sinuses and mastoid air cells are clear. There is no acute skull fracture.  IMPRESSION: 1. No acute intracranial abnormality is demonstrated. 2. There are age related changes of chronic small vessel ischemia. 3. There is no acute skull fracture.   Electronically Signed   By: David  [place]   On: 04/29/2014 14:14   Mr Brain Wo Contrast  04/29/2014   CLINICAL DATA:  80 year old female with persistent dizziness, loss of consciousness, vomiting, left arm numbness. Initial encounter.  EXAM: MRI HEAD WITHOUT CONTRAST  TECHNIQUE: Multiplanar, multiecho pulse sequences of the brain and surrounding structures were obtained without intravenous contrast.  COMPARISON:  Head CT without contrast 1409 hr the same day.  FINDINGS: Cerebral volume is within normal limits for age. No restricted diffusion to suggest acute infarction. No midline shift, mass effect, evidence of mass lesion, ventriculomegaly, extra-axial collection or acute intracranial hemorrhage. Cervicomedullary junction and pituitary are within normal limits. Major intracranial vascular flow voids are preserved. Minimal to mild for age nonspecific cerebral white matter T2 and FLAIR hyperintensity. No cortical encephalomalacia. Tiny chronic micro hemorrhage in the medial right cerebellum on series 8, image 7. Otherwise negative brainstem, cerebellum, and visualized internal auditory structures.  Visualized orbit soft tissues are within normal limits. Mild paranasal sinus mucosal thickening. Normal  bone marrow signal. Visualized scalp soft tissues are within normal limits.  IMPRESSION: No acute intracranial abnormality. Mild for age chronic small vessel disease.   Electronically Signed   By: Augusto Gamble M.D.   On: 04/29/2014 19:01   Dg Knee Complete 4 Views Left  04/29/2014   CLINICAL DATA:  Fall, anterior knee pain  EXAM: LEFT KNEE - COMPLETE 4+ VIEW  COMPARISON:  None.  FINDINGS: No fracture or dislocation is seen.  Moderate tricompartmental degenerative changes, most prominent in the medial compartment.  The visualized soft tissues are unremarkable.  Possible small suprapatellar knee joint effusion.  IMPRESSION: No fracture or dislocation is seen.  Moderate degenerative changes with possible small suprapatellar knee joint effusion.   Electronically Signed   By: Charline Bills M.D.   On: 04/29/2014 15:21    CBC  Recent Labs Lab 04/29/14 1336 04/29/14 1626  WBC 11.3* 13.5*  HGB 14.5 14.1  HCT 43.5 41.9  PLT 222 193  MCV 104.3* 103.5*  MCH 34.8* 34.8*  MCHC 33.3 33.7  RDW 12.7 12.8  LYMPHSABS 1.6  --   MONOABS 0.7  --   EOSABS 0.1  --   BASOSABS 0.0  --     Chemistries   Recent Labs Lab 04/29/14 1336 04/29/14 1626  NA 136*  --   K 4.6  --   CL 98  --   CO2 25  --   GLUCOSE 121*  --   BUN 19  --   CREATININE 1.08 0.93  CALCIUM 10.0  --   AST 28  --   ALT 22  --   ALKPHOS 56  --   BILITOT 1.0  --    ------------------------------------------------------------------------------------------------------------------ estimated creatinine clearance is 46.6 ml/min (by C-G formula based on Cr of 0.93). ------------------------------------------------------------------------------------------------------------------ No results found for this basename: HGBA1C,  in the last 72 hours ------------------------------------------------------------------------------------------------------------------ No results found for this basename: CHOL, HDL, LDLCALC, TRIG, CHOLHDL,  LDLDIRECT,  in the last 72 hours ------------------------------------------------------------------------------------------------------------------  Recent Labs  04/29/14 1703  TSH 1.300   ------------------------------------------------------------------------------------------------------------------ No results found for this basename: VITAMINB12, FOLATE, FERRITIN, TIBC, IRON, RETICCTPCT,  in the last 72 hours  Coagulation profile No results found for this basename: INR, PROTIME,  in the last 168 hours  No results found for this basename: DDIMER,  in the last 72 hours  Cardiac Enzymes  Recent Labs Lab 04/29/14 1336 04/29/14 1626  TROPONINI <0.30 <0.30   ------------------------------------------------------------------------------------------------------------------ No components found with this basename: POCBNP,      Time Spent in minutes   35   Leroy SeaPrashant K Meia Emley M.D on 04/30/2014 at 10:05 AM  Between 7am to 7pm - Pager - [phone]  After 7pm go to [email or web address] - password [email or web address]  And look for the night coverage person covering for me after hours  [facility] Office  [phone]   **Disclaimer: This note may have been dictated with voice recognition software. Similar sounding words can inadvertently be transcribed and this note may contain transcription errors which may not have been corrected upon publication of note.**

## 2014-04-30 NOTE — Progress Notes (Signed)
NEURO HOSPITALIST CONSULT NOTE    Reason for Consult: Seizure versus Syncope  HPI:                                                                                                                                          CAMRIE STOCK is an 80 y.o. female who was sitting in her recliner the other day when she stood up. After standing she noted a overwhelming sense of being light headed and then passed out.  She feels she was out for only a very short period of time <3 minutes. When she awoke she was face down but alert to her surroundings, able to get to her recliner and get up in the recliner.  She was lightly confused about what had just happened but felt ok. She did not call EMS for 30 minutes due to debating if she needed to. She did not urinate or defecate on herself. She did have a small area on the tip on her tongue bitten. Currently she is back to her baseline.   She denies history of seizure or family history of seizure.    Past Medical History  Diagnosis Date  . Hypertension   . Hyperlipemia   . GERD (gastroesophageal reflux disease)     Past Surgical History  Procedure Laterality Date  . Abdominal hysterectomy    . Tonsillectomy      Family History  Problem Relation Age of Onset  . Hypertension Mother   . Hyperlipidemia Father   . Hypertension Father     Social History:  reports that she quit smoking about 51 years ago. Her smoking use included Cigarettes. She smoked 0.00 packs per day for 20 years. She has never used smokeless tobacco. She reports that she drinks alcohol. She reports that she does not use illicit drugs.  Allergies  Allergen Reactions  . Sulfa Antibiotics Hives    MEDICATIONS:                                                                                                                     Prior to Admission:  Prescriptions prior to admission  Medication Sig Dispense Refill  . carvedilol (COREG) 6.25 MG tablet Take 6.25 mg  by mouth 2 (two) times daily with a meal.      .  lisinopril (PRINIVIL,ZESTRIL) 20 MG tablet Take 20 mg by mouth daily.      Marland Kitchen lovastatin (MEVACOR) 20 MG tablet Take 20 mg by mouth at bedtime.      . ranitidine (ZANTAC) 150 MG tablet Take 150 mg by mouth 2 (two) times daily.      Marland Kitchen trolamine salicylate (ASPERCREME) 10 % cream Apply 1 application topically as needed for muscle pain. Apply's to knee's       Scheduled: . aspirin EC  325 mg Oral Daily  . carvedilol  6.25 mg Oral BID WC  . enoxaparin (LOVENOX) injection  40 mg Subcutaneous Q24H  . pantoprazole  40 mg Oral Daily  . simvastatin  10 mg Oral q1800     ROS:                                                                                                                                       History obtained from the patient  General ROS: negative for - chills, fatigue, fever, night sweats, weight gain or weight loss Psychological ROS: negative for - behavioral disorder, hallucinations, memory difficulties, mood swings or suicidal ideation Ophthalmic ROS: negative for - blurry vision, double vision, eye pain or loss of vision ENT ROS: negative for - epistaxis, nasal discharge, oral lesions, sore throat, tinnitus or vertigo Allergy and Immunology ROS: negative for - hives or itchy/watery eyes Hematological and Lymphatic ROS: negative for - bleeding problems, bruising or swollen lymph nodes Endocrine ROS: negative for - galactorrhea, hair pattern changes, polydipsia/polyuria or temperature intolerance Respiratory ROS: negative for - cough, hemoptysis, shortness of breath or wheezing Cardiovascular ROS: negative for - chest pain, dyspnea on exertion, edema or irregular heartbeat Gastrointestinal ROS: negative for - abdominal pain, diarrhea, hematemesis, nausea/vomiting or stool incontinence Genito-Urinary ROS: negative for - dysuria, hematuria, incontinence or urinary frequency/urgency Musculoskeletal ROS: negative for - joint swelling  or muscular weakness Neurological ROS: as noted in HPI Dermatological ROS: negative for rash and skin lesion changes   Blood pressure 166/82, pulse 74, temperature 98.2 F (36.8 C), temperature source Oral, resp. rate 18, height 5\' 3"  (1.6 m), weight 76.839 kg (169 lb 6.4 oz), SpO2 97.00%.   Neurologic Examination:                                                                                                      Mental Status: Alert, oriented, thought content appropriate.  Speech fluent without evidence of aphasia.  Able to follow 3 step commands  without difficulty. Cranial Nerves: II: Discs flat bilaterally; Visual fields grossly normal, pupils equal, round, reactive to light and accommodation III,IV, VI: ptosis not present, extra-ocular motions intact bilaterally V,VII: smile symmetric, facial light touch sensation normal bilaterally VIII: hearing normal bilaterally IX,X: gag reflex present XI: bilateral shoulder shrug XII: midline tongue extension without atrophy or fasciculations  Motor: Right : Upper extremity   5/5    Left:     Upper extremity   5/5  Lower extremity   5/5     Lower extremity   5/5 Tone and bulk:normal tone throughout; no atrophy noted Sensory: Pinprick and light touch intact throughout, bilaterally Deep Tendon Reflexes:  Right: Upper Extremity   Left: Upper extremity   biceps (C-5 to C-6) 2/4   biceps (C-5 to C-6) 2/4 tricep (C7) 2/4    triceps (C7) 2/4 Brachioradialis (C6) 2/4  Brachioradialis (C6) 2/4  Lower Extremity Lower Extremity  quadriceps (L-2 to L-4) 2/4   quadriceps (L-2 to L-4) 2/4 Achilles (S1) 2/4   Achilles (S1) 2/4  Plantars: Right: downgoing   Left: downgoing Cerebellar: normal finger-to-nose,  normal heel-to-shin test Gait: Unable to test CV: pulses palpable throughout    Lab Results: Basic Metabolic Panel:  Recent Labs Lab 04/29/14 1336 04/29/14 1626  NA 136*  --   K 4.6  --   CL 98  --   CO2 25  --   GLUCOSE 121*  --    BUN 19  --   CREATININE 1.08 0.93  CALCIUM 10.0  --     Liver Function Tests:  Recent Labs Lab 04/29/14 1336  AST 28  ALT 22  ALKPHOS 56  BILITOT 1.0  PROT 7.7  ALBUMIN 3.7   No results found for this basename: LIPASE, AMYLASE,  in the last 168 hours No results found for this basename: AMMONIA,  in the last 168 hours  CBC:  Recent Labs Lab 04/29/14 1336 04/29/14 1626  WBC 11.3* 13.5*  NEUTROABS 8.9*  --   HGB 14.5 14.1  HCT 43.5 41.9  MCV 104.3* 103.5*  PLT 222 193    Cardiac Enzymes:  Recent Labs Lab 04/29/14 1336 04/29/14 1626  TROPONINI <0.30 <0.30    Lipid Panel: No results found for this basename: CHOL, TRIG, HDL, CHOLHDL, VLDL, LDLCALC,  in the last 168 hours  CBG:  Recent Labs Lab 04/29/14 1305  GLUCAP 117*    Microbiology: No results found for this or any previous visit.  Coagulation Studies: No results found for this basename: LABPROT, INR,  in the last 72 hours  Imaging: Dg Chest 2 View  04/29/2014   CLINICAL DATA:  Loss of consciousness; pain post trauma  EXAM: CHEST  2 VIEW  COMPARISON:  None.  FINDINGS: Lungs are clear. Heart size and pulmonary vascularity are normal. No adenopathy. No pneumothorax. No bone lesions. There is atherosclerotic change in the aorta.  IMPRESSION: No pneumothorax.  No edema or consolidation.   Electronically Signed   By: Bretta BangWilliam  Woodruff M.D.   On: 04/29/2014 15:19   Ct Head Wo Contrast  04/29/2014   CLINICAL DATA:  Dizziness and syncope and unwitnessed fall with emesis ; subsequent diarrhea.  EXAM: CT HEAD WITHOUT CONTRAST  TECHNIQUE: Contiguous axial images were obtained from the base of the skull through the vertex without intravenous contrast.  COMPARISON:  None.  FINDINGS: There is mild age-appropriate diffuse cerebral and cerebellar atrophy with compensatory ventriculomegaly. There is no intracranial hemorrhage nor intracranial mass effect. There is mildly  decreased density in the deep white matter of  both cerebral hemispheres consistent with chronic small vessel ischemic type change. The cerebellum and brainstem exhibit normal density.  At bone window settings the observed portions of the paranasal sinuses and mastoid air cells are clear. There is no acute skull fracture.  IMPRESSION: 1. No acute intracranial abnormality is demonstrated. 2. There are age related changes of chronic small vessel ischemia. 3. There is no acute skull fracture.   Electronically Signed   By: David  [place]   On: 04/29/2014 14:14   Mr Brain Wo Contrast  04/29/2014   CLINICAL DATA:  80 year old female with persistent dizziness, loss of consciousness, vomiting, left arm numbness. Initial encounter.  EXAM: MRI HEAD WITHOUT CONTRAST  TECHNIQUE: Multiplanar, multiecho pulse sequences of the brain and surrounding structures were obtained without intravenous contrast.  COMPARISON:  Head CT without contrast 1409 hr the same day.  FINDINGS: Cerebral volume is within normal limits for age. No restricted diffusion to suggest acute infarction. No midline shift, mass effect, evidence of mass lesion, ventriculomegaly, extra-axial collection or acute intracranial hemorrhage. Cervicomedullary junction and pituitary are within normal limits. Major intracranial vascular flow voids are preserved. Minimal to mild for age nonspecific cerebral white matter T2 and FLAIR hyperintensity. No cortical encephalomalacia. Tiny chronic micro hemorrhage in the medial right cerebellum on series 8, image 7. Otherwise negative brainstem, cerebellum, and visualized internal auditory structures.  Visualized orbit soft tissues are within normal limits. Mild paranasal sinus mucosal thickening. Normal bone marrow signal. Visualized scalp soft tissues are within normal limits.  IMPRESSION: No acute intracranial abnormality. Mild for age chronic small vessel disease.   Electronically Signed   By: Augusto Gamble M.D.   On: 04/29/2014 19:01   Dg Knee Complete 4 Views  Left  04/29/2014   CLINICAL DATA:  Fall, anterior knee pain  EXAM: LEFT KNEE - COMPLETE 4+ VIEW  COMPARISON:  None.  FINDINGS: No fracture or dislocation is seen.  Moderate tricompartmental degenerative changes, most prominent in the medial compartment.  The visualized soft tissues are unremarkable.  Possible small suprapatellar knee joint effusion.  IMPRESSION: No fracture or dislocation is seen.  Moderate degenerative changes with possible small suprapatellar knee joint effusion.   Electronically Signed   By: Charline Bills M.D.   On: 04/29/2014 15:21    2 D echo:  Pending  Felicie Morn PA-C [facility] [phone]  04/30/2014, 1:48 PM  Patient seen and examined.  Clinical course and management discussed.  Necessary edits performed.  I agree with the above.  Assessment and plan of care developed and discussed below.    Assessment/Plan: 80 YO female presenting to hospital after transient loss of consciousness which was unwitnessed. Thus far work up has been negative. Etiology at this time is unclear.  Orthostatics unremarkable.  MRI of the brain reviewed and shows no acute changes.   Recommendations: 1.  EEG 2.  Will follow up results of echo 3.  Continue ASA daily     Thana Farr, MD [facility] [phone]  04/30/2014  5:16 PM

## 2014-04-30 NOTE — Progress Notes (Signed)
*  PRELIMINARY RESULTS* Vascular Ultrasound Carotid Duplex (Doppler) has been completed.  Preliminary findings: Bilateral:  1-39% ICA stenosis.  Vertebral artery flow is antegrade.      Farrel Demark, RDMS, RVT  04/30/2014, 3:51 PM

## 2014-04-30 NOTE — Progress Notes (Signed)
Occupational Therapy Evaluation and Discharge Patient Details Name: Adonis BrookSandra B Mcintire MRN: [id number] DOB: [birth year] Today's Date: 04/30/2014    History of Present Illness Pt is 80 y.o Female admitted 04/29/14 for syncopal episode. Pt reports onset of dizziness when sitting in recliner, stood up, and positive LOC <755minutes. Pt awoke with bruise on Lt knee and had vomited. No symptoms prior to incident. Negative MRI and CT for acute changes.    Clinical Impression   PTA pt lived at ALF and was independent with use of RW for ADLs and functional mobility. Pt states she feels "almost back to normal" and denies dizziness or L sided numbness or weakness. Pt overall mod Independent for ADLs and was supervision for transfer due to recent syncope. However feel that pt is safe to return home. No further acute OT needs.    Follow Up Recommendations  No OT follow up;Supervision/Assistance - 24 hour    Equipment Recommendations  None recommended by OT       Precautions / Restrictions Precautions Precautions: Fall Restrictions Weight Bearing Restrictions: No      Mobility Bed Mobility               General bed mobility comments: Not assessed, pt sitting in recliner before/after OT.   Transfers Overall transfer level: Needs assistance Equipment used: Rolling walker (2 wheeled) Transfers: Sit to/from Stand Sit to Stand: Supervision         General transfer comment: No verbal cues for hand placement, pt with good technique. Supervision due to recent syncope, however pt reports no dizziness upon standing.         ADL Overall ADL's : At baseline;Modified independent                                       General ADL Comments: Pt is at baseline and feels "nearly back to normal." Pt able to stand with Supervision with use of RW. Supervision for safety given recent syncope. Educated pt on energy conservation, fall prevention, and environmental modifications to increase  safety.      Vision  Pt wears glasses all the time.  Pt reports no change from baseline.  No apparent visual deficits.               Perception Perception Perception Tested?: No   Praxis Praxis Praxis tested?: Within functional limits    Pertinent Vitals/Pain No c/o pain, NAD, no c/o dizziness.     Hand Dominance Right   Extremity/Trunk Assessment Upper Extremity Assessment Upper Extremity Assessment: Overall WFL for tasks assessed   Lower Extremity Assessment Lower Extremity Assessment: Defer to PT evaluation   Cervical / Trunk Assessment Cervical / Trunk Assessment: Normal   Communication Communication Communication: HOH   Cognition Arousal/Alertness: Awake/alert Behavior During Therapy: WFL for tasks assessed/performed Overall Cognitive Status: Within Functional Limits for tasks assessed                                Home Living Family/patient expects to be discharged to:: Assisted living Living Arrangements: Alone                           Home Equipment: Walker - 2 wheels;Shower seat - built in   Additional Comments: Pt's apartment has call bell to alert staff in emergency. Apt  is handicap accessible.      Prior Functioning/Environment Level of Independence: Independent with assistive device(s)              End of Session Equipment Utilized During Treatment: Gait belt;Rolling walker Activity Tolerance: Patient tolerated treatment well Patient left: in chair;with call bell/phone within reach;with family/visitor present   Time: 1147-1200 OT Time Calculation (min): 13 min Charges:  OT General Charges $OT Visit: 1 Procedure OT Evaluation $Initial OT Evaluation Tier I: 1 Procedure  Rae Lips [phone] 04/30/2014, 1:49 PM

## 2014-04-30 NOTE — Progress Notes (Signed)
EEG completed; results pending.    

## 2014-04-30 NOTE — Clinical Social Work Note (Signed)
CSW consulted for ALF placement at time of discharge. CSW met with pt at bedside to discuss discharge disposition. Per pt, pt is from [facility] independent [facility]. Per chart review, pt has no PT follow-up recommendations. Pt stated she will be transported via her daughters at time of discharge. CSW informed RN of information above. CSW signing off. Thank you for the referral.  *No FL2 required as pt is from independent living*  Lubertha Sayres, MSW, [facility] Licensed Clinical Social Worker [phone] and [phone]

## 2014-04-30 NOTE — Progress Notes (Signed)
  [name] 04/30/2014, 3:40 PM

## 2014-04-30 NOTE — Procedures (Signed)
ELECTROENCEPHALOGRAM REPORT   Patient: Jill Hernandez       Room #: 0E23 EEG No. ID: [id number] Age: 80 y.o.        Sex: female Referring Physician: Thedore Mins Report Date:  04/30/2014        Interpreting Physician: Thana Farr  History: Jill Hernandez is an 80 y.o. female with an episode of syncope evaluated to rule out seizure  Medications:  Scheduled: . aspirin EC  325 mg Oral Daily  . carvedilol  6.25 mg Oral BID WC  . enoxaparin (LOVENOX) injection  40 mg Subcutaneous Q24H  . pantoprazole  40 mg Oral Daily  . simvastatin  10 mg Oral q1800    Conditions of Recording:  This is a 16 channel EEG carried out with the patient in the awake state.  Description:  The waking background activity consists of a low voltage, symmetrical, fairly well organized, 8.5-9 Hz alpha activity, seen from the parieto-occipital and posterior temporal regions.  Low voltage fast activity, poorly organized, is seen anteriorly and is at times superimposed on more posterior regions.  A mixture of theta and alpha rhythms are seen from the central and temporal regions.  Artifact is prominent throughout. The patient does not drowse or sleep. Hyperventilation was not performed.  Intermittent photic stimulation was performed but failed to illicit any change in the tracing.    IMPRESSION: This is a normal awake electroencephalogram.  No epileptiform activity is noted.     Comment:  An EEG with the patient sleep deprived to elicit drowse and light sleep may be desirable to further elicit a possible seizure disorder.     Thana Farr, MD [facility] [phone] 04/30/2014, 4:47 PM

## 2014-05-01 DIAGNOSIS — R55 Syncope and collapse: Secondary | ICD-10-CM | POA: Diagnosis not present

## 2014-05-01 DIAGNOSIS — S8000XA Contusion of unspecified knee, initial encounter: Secondary | ICD-10-CM | POA: Diagnosis not present

## 2014-05-01 DIAGNOSIS — I10 Essential (primary) hypertension: Secondary | ICD-10-CM | POA: Diagnosis not present

## 2014-05-01 DIAGNOSIS — E785 Hyperlipidemia, unspecified: Secondary | ICD-10-CM | POA: Diagnosis not present

## 2014-05-01 LAB — COMPREHENSIVE METABOLIC PANEL
ALBUMIN: 3.1 g/dL — AB (ref 3.5–5.2)
ALT: 16 U/L (ref 0–35)
AST: 20 U/L (ref 0–37)
Alkaline Phosphatase: 42 U/L (ref 39–117)
BUN: 12 mg/dL (ref 6–23)
CO2: 26 mEq/L (ref 19–32)
CREATININE: 1.09 mg/dL (ref 0.50–1.10)
Calcium: 9.2 mg/dL (ref 8.4–10.5)
Chloride: 103 mEq/L (ref 96–112)
GFR calc Af Amer: 54 mL/min — ABNORMAL LOW (ref >90)
GFR, EST NON AFRICAN AMERICAN: 46 mL/min — AB (ref >90)
Glucose, Bld: 105 mg/dL — ABNORMAL HIGH (ref 70–99)
Potassium: 4.1 mEq/L (ref 3.7–5.3)
Sodium: 140 mEq/L (ref 137–147)
TOTAL PROTEIN: 6.5 g/dL (ref 6.0–8.3)
Total Bilirubin: 0.9 mg/dL (ref 0.3–1.2)

## 2014-05-01 LAB — CBC WITH DIFFERENTIAL/PLATELET
Basophils Absolute: 0 10*3/uL (ref 0.0–0.1)
Basophils Relative: 0 % (ref 0–1)
Eosinophils Absolute: 0.2 10*3/uL (ref 0.0–0.7)
Eosinophils Relative: 3 % (ref 0–5)
HEMATOCRIT: 36 % (ref 36.0–46.0)
HEMOGLOBIN: 11.9 g/dL — AB (ref 12.0–15.0)
LYMPHS ABS: 1.9 10*3/uL (ref 0.7–4.0)
LYMPHS PCT: 31 % (ref 12–46)
MCH: 35.2 pg — ABNORMAL HIGH (ref 26.0–34.0)
MCHC: 33.1 g/dL (ref 30.0–36.0)
MCV: 106.5 fL — AB (ref 78.0–100.0)
MONOS PCT: 12 % (ref 3–12)
Monocytes Absolute: 0.8 10*3/uL (ref 0.1–1.0)
Neutro Abs: 3.4 10*3/uL (ref 1.7–7.7)
Neutrophils Relative %: 54 % (ref 43–77)
PLATELETS: 175 10*3/uL (ref 150–400)
RBC: 3.38 MIL/uL — AB (ref 3.87–5.11)
RDW: 13 % (ref 11.5–15.5)
WBC: 6.3 10*3/uL (ref 4.0–10.5)

## 2014-05-01 LAB — MAGNESIUM: Magnesium: 1.9 mg/dL (ref 1.5–2.5)

## 2014-05-01 MED ORDER — ASPIRIN EC 81 MG PO TBEC: 81.0000 mg | DELAYED_RELEASE_TABLET | Freq: Every day | ORAL | Status: DC

## 2014-05-01 NOTE — Discharge Instructions (Signed)
Follow with Primary MD HEPLER,MARK, PA-C in 7 days   Get CBC, CMP, checked 7 days by Primary MD .   Activity: As tolerated with Full fall precautions use walker/cane & assistance as needed   Disposition Home     Diet: Heart Healthy    For Heart failure patients - Check your Weight same time everyday, if you gain over 2 pounds, or you develop in leg swelling, experience more shortness of breath or chest pain, call your Primary MD immediately. Follow Cardiac Low Salt Diet and 1.8 lit/day fluid restriction.   On your next visit with her primary care physician please Get Medicines reviewed and adjusted.  Please request your Prim.MD to go over all Hospital Tests and Procedure/Radiological results at the follow up, please get all Hospital records sent to your Prim MD by signing hospital release before you go home.   If you experience worsening of your admission symptoms, develop shortness of breath, life threatening emergency, suicidal or homicidal thoughts you must seek medical attention immediately by calling 911 or calling your MD immediately  if symptoms less severe.  You Must read complete instructions/literature along with all the possible adverse reactions/side effects for all the Medicines you take and that have been prescribed to you. Take any new Medicines after you have completely understood and accpet all the possible adverse reactions/side effects.   Do not drive and provide baby sitting services if your were admitted for syncope or siezures until you have seen by Primary MD or a Neurologist and advised to do so again.  Do not drive when taking Pain medications.    Do not take more than prescribed Pain, Sleep and Anxiety Medications  Special Instructions: If you have smoked or chewed Tobacco  in the last 2 yrs please stop smoking, stop any regular Alcohol  and or any Recreational drug use.  Wear Seat belts while driving.   Please note  You were cared for by a hospitalist  during your hospital stay. If you have any questions about your discharge medications or the care you received while you were in the hospital after you are discharged, you can call the unit and asked to speak with the hospitalist on call if the hospitalist that took care of you is not available. Once you are discharged, your primary care physician will handle any further medical issues. Please note that NO REFILLS for any discharge medications will be authorized once you are discharged, as it is imperative that you return to your primary care physician (or establish a relationship with a primary care physician if you do not have one) for your aftercare needs so that they can reassess your need for medications and monitor your lab values.

## 2014-05-01 NOTE — Progress Notes (Signed)
Subjective: Patient remains at baseline.  No further syncopal events.   Objective: Current vital signs: BP 120/42  Pulse 72  Temp(Src) 98 F (36.7 C) (Oral)  Resp 20  Ht 5\' 3"  (1.6 m)  Wt 76.839 kg (169 lb 6.4 oz)  BMI 30.02 kg/m2  SpO2 97% Vital signs in last 24 hours: Temp:  [97.5 F (36.4 C)-98.6 F (37 C)] 98 F (36.7 C) (06/05 0929) Pulse Rate:  [66-72] 72 (06/05 0929) Resp:  [20] 20 (06/05 0929) BP: (120-146)/(42-62) 120/42 mmHg (06/05 0929) SpO2:  [95 %-99 %] 97 % (06/05 0929)  Intake/Output from previous day: 06/04 0701 - 06/05 0700 In: 240 [P.O.:240] Out: -  Intake/Output this shift: Total I/O In: 360 [P.O.:360] Out: -  Nutritional status: Cardiac  Neurologic Exam: Mental Status:  Alert, oriented, thought content appropriate. Speech fluent without evidence of aphasia. Able to follow 3 step commands without difficulty.  Cranial Nerves:  II: Discs flat bilaterally; Visual fields grossly normal, pupils equal, round, reactive to light and accommodation  III,IV, VI: ptosis not present, extra-ocular motions intact bilaterally  V,VII: smile symmetric, facial light touch sensation normal bilaterally  VIII: hearing normal bilaterally  IX,X: gag reflex present  XI: bilateral shoulder shrug  XII: midline tongue extension without atrophy or fasciculations  Motor:  5/5 throughout Tone and bulk:normal tone throughout; no atrophy noted  Sensory: Pinprick and light touch intact throughout, bilaterally  Deep Tendon Reflexes:  2+ throughout Plantars:  Right: downgoing   Left: downgoing  Cerebellar:  normal finger-to-nose, normal heel-to-shin test   Lab Results: Basic Metabolic Panel:  Recent Labs Lab 04/29/14 1336 04/29/14 1626 05/01/14 0555  NA 136*  --  140  K 4.6  --  4.1  CL 98  --  103  CO2 25  --  26  GLUCOSE 121*  --  105*  BUN 19  --  12  CREATININE 1.08 0.93 1.09  CALCIUM 10.0  --  9.2  MG  --   --  1.9    Liver Function Tests:  Recent  Labs Lab 04/29/14 1336 05/01/14 0555  AST 28 20  ALT 22 16  ALKPHOS 56 42  BILITOT 1.0 0.9  PROT 7.7 6.5  ALBUMIN 3.7 3.1*   No results found for this basename: LIPASE, AMYLASE,  in the last 168 hours No results found for this basename: AMMONIA,  in the last 168 hours  CBC:  Recent Labs Lab 04/29/14 1336 04/29/14 1626 05/01/14 0555  WBC 11.3* 13.5* 6.3  NEUTROABS 8.9*  --  3.4  HGB 14.5 14.1 11.9*  HCT 43.5 41.9 36.0  MCV 104.3* 103.5* 106.5*  PLT 222 193 175    Cardiac Enzymes:  Recent Labs Lab 04/29/14 1336 04/29/14 1626  TROPONINI <0.30 <0.30    Lipid Panel: No results found for this basename: CHOL, TRIG, HDL, CHOLHDL, VLDL, LDLCALC,  in the last 168 hours  CBG:  Recent Labs Lab 04/29/14 1305  GLUCAP 117*    Microbiology: Results for orders placed during the hospital encounter of 04/29/14  URINE CULTURE     Status: None   Collection Time    04/29/14  4:40 PM      Result Value Ref Range Status   Specimen Description URINE, CLEAN CATCH   Final   Special Requests NONE   Final   Culture  Setup Time     Final   Value: 04/30/2014 00:25     Performed at [facility] Count  Final   Value: 30,000 COLONIES/ML     Performed at [facility]   Culture     Final   Value: Multiple bacterial morphotypes present, none predominant. Suggest appropriate recollection if clinically indicated.     Performed at [facility]   Report Status 04/30/2014 FINAL   Final  CULTURE, BLOOD (ROUTINE X 2)     Status: None   Collection Time    04/29/14 11:20 PM      Result Value Ref Range Status   Specimen Description BLOOD RIGHT HAND   Final   Special Requests BOTTLES DRAWN AEROBIC AND ANAEROBIC 10CC   Final   Culture  Setup Time     Final   Value: 04/30/2014 03:40     Performed at [facility]   Culture     Final   Value:        BLOOD CULTURE RECEIVED NO GROWTH TO DATE CULTURE WILL BE HELD FOR 5 DAYS BEFORE ISSUING A FINAL  NEGATIVE REPORT     Performed at [facility]   Report Status PENDING   Incomplete  CULTURE, BLOOD (ROUTINE X 2)     Status: None   Collection Time    04/29/14 11:25 PM      Result Value Ref Range Status   Specimen Description BLOOD RIGHT HAND   Final   Special Requests BOTTLES DRAWN AEROBIC ONLY 10CC   Final   Culture  Setup Time     Final   Value: 04/30/2014 03:40     Performed at [facility]   Culture     Final   Value:        BLOOD CULTURE RECEIVED NO GROWTH TO DATE CULTURE WILL BE HELD FOR 5 DAYS BEFORE ISSUING A FINAL NEGATIVE REPORT     Performed at [facility]   Report Status PENDING   Incomplete    Coagulation Studies: No results found for this basename: LABPROT, INR,  in the last 72 hours  Imaging: Dg Chest 2 View  04/29/2014   CLINICAL DATA:  Loss of consciousness; pain post trauma  EXAM: CHEST  2 VIEW  COMPARISON:  None.  FINDINGS: Lungs are clear. Heart size and pulmonary vascularity are normal. No adenopathy. No pneumothorax. No bone lesions. There is atherosclerotic change in the aorta.  IMPRESSION: No pneumothorax.  No edema or consolidation.   Electronically Signed   By: Bretta BangWilliam  Woodruff M.D.   On: 04/29/2014 15:19   Ct Head Wo Contrast  04/29/2014   CLINICAL DATA:  Dizziness and syncope and unwitnessed fall with emesis ; subsequent diarrhea.  EXAM: CT HEAD WITHOUT CONTRAST  TECHNIQUE: Contiguous axial images were obtained from the base of the skull through the vertex without intravenous contrast.  COMPARISON:  None.  FINDINGS: There is mild age-appropriate diffuse cerebral and cerebellar atrophy with compensatory ventriculomegaly. There is no intracranial hemorrhage nor intracranial mass effect. There is mildly decreased density in the deep white matter of both cerebral hemispheres consistent with chronic small vessel ischemic type change. The cerebellum and brainstem exhibit normal density.  At bone window settings the observed portions of the  paranasal sinuses and mastoid air cells are clear. There is no acute skull fracture.  IMPRESSION: 1. No acute intracranial abnormality is demonstrated. 2. There are age related changes of chronic small vessel ischemia. 3. There is no acute skull fracture.   Electronically Signed   By: David  SwazilandJordan   On: 04/29/2014 14:14   Mr  Brain Wo Contrast  04/29/2014   CLINICAL DATA:  80 year old female with persistent dizziness, loss of consciousness, vomiting, left arm numbness. Initial encounter.  EXAM: MRI HEAD WITHOUT CONTRAST  TECHNIQUE: Multiplanar, multiecho pulse sequences of the brain and surrounding structures were obtained without intravenous contrast.  COMPARISON:  Head CT without contrast 1409 hr the same day.  FINDINGS: Cerebral volume is within normal limits for age. No restricted diffusion to suggest acute infarction. No midline shift, mass effect, evidence of mass lesion, ventriculomegaly, extra-axial collection or acute intracranial hemorrhage. Cervicomedullary junction and pituitary are within normal limits. Major intracranial vascular flow voids are preserved. Minimal to mild for age nonspecific cerebral white matter T2 and FLAIR hyperintensity. No cortical encephalomalacia. Tiny chronic micro hemorrhage in the medial right cerebellum on series 8, image 7. Otherwise negative brainstem, cerebellum, and visualized internal auditory structures.  Visualized orbit soft tissues are within normal limits. Mild paranasal sinus mucosal thickening. Normal bone marrow signal. Visualized scalp soft tissues are within normal limits.  IMPRESSION: No acute intracranial abnormality. Mild for age chronic small vessel disease.   Electronically Signed   By: Augusto Gamble M.D.   On: 04/29/2014 19:01   Dg Knee Complete 4 Views Left  04/29/2014   CLINICAL DATA:  Fall, anterior knee pain  EXAM: LEFT KNEE - COMPLETE 4+ VIEW  COMPARISON:  None.  FINDINGS: No fracture or dislocation is seen.  Moderate tricompartmental degenerative  changes, most prominent in the medial compartment.  The visualized soft tissues are unremarkable.  Possible small suprapatellar knee joint effusion.  IMPRESSION: No fracture or dislocation is seen.  Moderate degenerative changes with possible small suprapatellar knee joint effusion.   Electronically Signed   By: Charline Bills M.D.   On: 04/29/2014 15:21    Medications:  I have reviewed the patient's current medications. Scheduled: . aspirin EC  325 mg Oral Daily  . carvedilol  6.25 mg Oral BID WC  . enoxaparin (LOVENOX) injection  40 mg Subcutaneous Q24H  . pantoprazole  40 mg Oral Daily  . simvastatin  10 mg Oral q1800    Assessment/Plan: Patient at baseline.  MRI of the brain unremarkable.  EEG unremarkable.  Echocardiogram unremarkable.  Patient not orthostatic.  No neurologic cause of syncope identified.    Recommendations: 1.  Continue ASA daily 2.  No further neurologic intervention is recommended at this time.  If further questions arise, please call or page at that time.  Thank you for allowing neurology to participate in the care of this patient.  Case discussed with Dr. Thedore Mins   LOS: 2 days   Thana Farr, MD [facility] [phone] 05/01/2014  10:49 AM

## 2014-05-01 NOTE — Discharge Summary (Signed)
Jill Hernandez, is a 80 y.o. female  DOB 13-Feb-1930  MRN [id number].  Admission date:  04/29/2014  Admitting Physician  Drema Dallas, MD  Discharge Date:  05/01/2014   Primary MD  Lovenia Kim, PA-C  Recommendations for primary care physician for things to follow:   Please make sure patient follows with recommended cardiologist in neurologist one time after discharge   Admission Diagnosis  Syncope [780.2] Contusion of left knee [924.11]   Discharge Diagnosis  Syncope [780.2] Contusion of left knee [924.11]   Active Problems:   Syncope   TIA (transient ischemic attack)   HTN (hypertension)   HLD (hyperlipidemia)   Left knee pain      Past Medical History  Diagnosis Date  . Hypertension   . Hyperlipemia   . GERD (gastroesophageal reflux disease)     Past Surgical History  Procedure Laterality Date  . Abdominal hysterectomy    . Tonsillectomy       Discharge Condition: Stable   Follow UP  Follow-up Information   Follow up with Cassell Clement, MD. Schedule an appointment as soon as possible for a visit in 1 week. (review of Echo findings)    Specialty:  Cardiology   Contact information:   [address] [place] [address] [phone]       Follow up with [facility]. Schedule an appointment as soon as possible for a visit in 1 week. (repeat EEG)    Contact information:   [address] [place] [address] [phone]      Follow up with HEPLER,MARK, PA-C. Schedule an appointment as soon as possible for a visit in 1 week.   Specialty:  Physician Assistant   Contact information:   [address] [place] [address] [phone]         Discharge Instructions  and  Discharge Medications     Discharge Instructions   Diet - low sodium  heart healthy    Complete by:  As directed      Discharge instructions    Complete by:  As directed   Follow with Primary MD HEPLER,MARK, PA-C in 7 days   Get CBC, CMP, checked 7 days by Primary MD.   Activity: As tolerated with Full fall precautions use walker/cane & assistance as needed   Disposition Home     Diet: Heart Healthy  .  For Heart failure patients - Check your Weight same time everyday, if you gain over 2 pounds, or you develop in leg swelling, experience more shortness of breath or chest pain, call your Primary MD immediately. Follow Cardiac Low Salt Diet and 1.8 lit/day fluid restriction.   On your next visit with her primary care physician please Get Medicines reviewed and adjusted.  Please request your Prim.MD to go over all Hospital Tests and Procedure/Radiological results at the follow up, please get all Hospital records sent to your Prim MD by signing hospital release before you go home.   If you  experience worsening of your admission symptoms, develop shortness of breath, life threatening emergency, suicidal or homicidal thoughts you must seek medical attention immediately by calling 911 or calling your MD immediately  if symptoms less severe.  You Must read complete instructions/literature along with all the possible adverse reactions/side effects for all the Medicines you take and that have been prescribed to you. Take any new Medicines after you have completely understood and accpet all the possible adverse reactions/side effects.   Do not drive and provide baby sitting services if your were admitted for syncope or siezures until you have seen by Primary MD or a Neurologist and advised to do so again.  Do not drive when taking Pain medications.    Do not take more than prescribed Pain, Sleep and Anxiety Medications  Special Instructions: If you have smoked or chewed Tobacco  in the last 2 yrs please stop smoking, stop any regular Alcohol  and or any  Recreational drug use.  Wear Seat belts while driving.   Please note  You were cared for by a hospitalist during your hospital stay. If you have any questions about your discharge medications or the care you received while you were in the hospital after you are discharged, you can call the unit and asked to speak with the hospitalist on call if the hospitalist that took care of you is not available. Once you are discharged, your primary care physician will handle any further medical issues. Please note that NO REFILLS for any discharge medications will be authorized once you are discharged, as it is imperative that you return to your primary care physician (or establish a relationship with a primary care physician if you do not have one) for your aftercare needs so that they can reassess your need for medications and monitor your lab values.  Follow with Primary MD HEPLER,MARK, PA-C in 7 days   Get CBC, CMP, checked 7 days by Primary MD.   Activity: As tolerated with Full fall precautions use walker/cane & assistance as needed   Disposition Home     Diet: Heart Healthy  .  For Heart failure patients - Check your Weight same time everyday, if you gain over 2 pounds, or you develop in leg swelling, experience more shortness of breath or chest pain, call your Primary MD immediately. Follow Cardiac Low Salt Diet and 1.8 lit/day fluid restriction.   On your next visit with her primary care physician please Get Medicines reviewed and adjusted.  Please request your Prim.MD to go over all Hospital Tests and Procedure/Radiological results at the follow up, please get all Hospital records sent to your Prim MD by signing hospital release before you go home.   If you experience worsening of your admission symptoms, develop shortness of breath, life threatening emergency, suicidal or homicidal thoughts you must seek medical attention immediately by calling 911 or calling your MD immediately  if  symptoms less severe.  You Must read complete instructions/literature along with all the possible adverse reactions/side effects for all the Medicines you take and that have been prescribed to you. Take any new Medicines after you have completely understood and accpet all the possible adverse reactions/side effects.   Do not drive and provide baby sitting services if your were admitted for syncope or siezures until you have seen by Primary MD or a Neurologist and advised to do so again.  Do not drive when taking Pain medications.    Do not take more than prescribed Pain, Sleep  and Anxiety Medications  Special Instructions: If you have smoked or chewed Tobacco  in the last 2 yrs please stop smoking, stop any regular Alcohol  and or any Recreational drug use.  Wear Seat belts while driving.   Please note  You were cared for by a hospitalist during your hospital stay. If you have any questions about your discharge medications or the care you received while you were in the hospital after you are discharged, you can call the unit and asked to speak with the hospitalist on call if the hospitalist that took care of you is not available. Once you are discharged, your primary care physician will handle any further medical issues. Please note that NO REFILLS for any discharge medications will be authorized once you are discharged, as it is imperative that you return to your primary care physician (or establish a relationship with a primary care physician if you do not have one) for your aftercare needs so that they can reassess your need for medications and monitor your lab values.     Increase activity slowly    Complete by:  As directed             Medication List         aspirin EC 81 MG tablet  Take 1 tablet (81 mg total) by mouth daily.     carvedilol 6.25 MG tablet  Commonly known as:  COREG  Take 6.25 mg by mouth 2 (two) times daily with a meal.     lisinopril 20 MG tablet  Commonly  known as:  PRINIVIL,ZESTRIL  Take 20 mg by mouth daily.     lovastatin 20 MG tablet  Commonly known as:  MEVACOR  Take 20 mg by mouth at bedtime.     ranitidine 150 MG tablet  Commonly known as:  ZANTAC  Take 150 mg by mouth 2 (two) times daily.     trolamine salicylate 10 % cream  Commonly known as:  ASPERCREME  Apply 1 application topically as needed for muscle pain. Apply's to knee's          Diet and Activity recommendation: See Discharge Instructions above   Consults obtained - Neuro   Major procedures and Radiology Reports - PLEASE review detailed and final reports for all details, in brief -   EEG  IMPRESSION:  This is a normal awake electroencephalogram. No epileptiform activity is noted.   Comment: An EEG with the patient sleep deprived to elicit drowse and light sleep may be desirable to further elicit a possible seizure disorder.    Echo  - Left ventricle: The cavity size was normal. Systolic function was normal. The estimated ejection fraction was in the range of 60% to 65%. Wall motion was normal; there were no regional wall motion abnormalities. - Mitral valve: Mildly calcified annulus. There was mild regurgitation. - Pulmonary arteries: Systolic pressure was mildly increased. PA peak pressure: 35 mm Hg (S). - Pericardium, extracardiac: Prominent ECHO free space anteriorly. COUld be loculated effusion or pericardial fat pad.     Carotids  Carotid Duplex (Doppler) has been completed. Preliminary findings: Bilateral: 1-39% ICA stenosis. Vertebral artery flow is antegrade.      Dg Chest 2 View  04/29/2014   CLINICAL DATA:  Loss of consciousness; pain post trauma  EXAM: CHEST  2 VIEW  COMPARISON:  None.  FINDINGS: Lungs are clear. Heart size and pulmonary vascularity are normal. No adenopathy. No pneumothorax. No bone lesions. There is atherosclerotic change in  the aorta.  IMPRESSION: No pneumothorax.  No edema or consolidation.   Electronically  Signed   By: Bretta Bang M.D.   On: 04/29/2014 15:19   Ct Head Wo Contrast  04/29/2014   CLINICAL DATA:  Dizziness and syncope and unwitnessed fall with emesis ; subsequent diarrhea.  EXAM: CT HEAD WITHOUT CONTRAST  TECHNIQUE: Contiguous axial images were obtained from the base of the skull through the vertex without intravenous contrast.  COMPARISON:  None.  FINDINGS: There is mild age-appropriate diffuse cerebral and cerebellar atrophy with compensatory ventriculomegaly. There is no intracranial hemorrhage nor intracranial mass effect. There is mildly decreased density in the deep white matter of both cerebral hemispheres consistent with chronic small vessel ischemic type change. The cerebellum and brainstem exhibit normal density.  At bone window settings the observed portions of the paranasal sinuses and mastoid air cells are clear. There is no acute skull fracture.  IMPRESSION: 1. No acute intracranial abnormality is demonstrated. 2. There are age related changes of chronic small vessel ischemia. 3. There is no acute skull fracture.   Electronically Signed   By: David  [place]   On: 04/29/2014 14:14   Mr Brain Wo Contrast  04/29/2014   CLINICAL DATA:  80 year old female with persistent dizziness, loss of consciousness, vomiting, left arm numbness. Initial encounter.  EXAM: MRI HEAD WITHOUT CONTRAST  TECHNIQUE: Multiplanar, multiecho pulse sequences of the brain and surrounding structures were obtained without intravenous contrast.  COMPARISON:  Head CT without contrast 1409 hr the same day.  FINDINGS: Cerebral volume is within normal limits for age. No restricted diffusion to suggest acute infarction. No midline shift, mass effect, evidence of mass lesion, ventriculomegaly, extra-axial collection or acute intracranial hemorrhage. Cervicomedullary junction and pituitary are within normal limits. Major intracranial vascular flow voids are preserved. Minimal to mild for age nonspecific cerebral white  matter T2 and FLAIR hyperintensity. No cortical encephalomalacia. Tiny chronic micro hemorrhage in the medial right cerebellum on series 8, image 7. Otherwise negative brainstem, cerebellum, and visualized internal auditory structures.  Visualized orbit soft tissues are within normal limits. Mild paranasal sinus mucosal thickening. Normal bone marrow signal. Visualized scalp soft tissues are within normal limits.  IMPRESSION: No acute intracranial abnormality. Mild for age chronic small vessel disease.   Electronically Signed   By: Augusto Gamble M.D.   On: 04/29/2014 19:01   Dg Knee Complete 4 Views Left  04/29/2014   CLINICAL DATA:  Fall, anterior knee pain  EXAM: LEFT KNEE - COMPLETE 4+ VIEW  COMPARISON:  None.  FINDINGS: No fracture or dislocation is seen.  Moderate tricompartmental degenerative changes, most prominent in the medial compartment.  The visualized soft tissues are unremarkable.  Possible small suprapatellar knee joint effusion.  IMPRESSION: No fracture or dislocation is seen.  Moderate degenerative changes with possible small suprapatellar knee joint effusion.   Electronically Signed   By: Charline Bills M.D.   On: 04/29/2014 15:21    Micro Results      Recent Results (from the past 240 hour(s))  URINE CULTURE     Status: None   Collection Time    04/29/14  4:40 PM      Result Value Ref Range Status   Specimen Description URINE, CLEAN CATCH   Final   Special Requests NONE   Final   Culture  Setup Time     Final   Value: 04/30/2014 00:25     Performed at [facility] Count  Final   Value: 30,000 COLONIES/ML     Performed at [facility]   Culture     Final   Value: Multiple bacterial morphotypes present, none predominant. Suggest appropriate recollection if clinically indicated.     Performed at [facility]   Report Status 04/30/2014 FINAL   Final  CULTURE, BLOOD (ROUTINE X 2)     Status: None   Collection Time    04/29/14 11:20 PM       Result Value Ref Range Status   Specimen Description BLOOD RIGHT HAND   Final   Special Requests BOTTLES DRAWN AEROBIC AND ANAEROBIC 10CC   Final   Culture  Setup Time     Final   Value: 04/30/2014 03:40     Performed at [facility]   Culture     Final   Value:        BLOOD CULTURE RECEIVED NO GROWTH TO DATE CULTURE WILL BE HELD FOR 5 DAYS BEFORE ISSUING A FINAL NEGATIVE REPORT     Performed at [facility]   Report Status PENDING   Incomplete  CULTURE, BLOOD (ROUTINE X 2)     Status: None   Collection Time    04/29/14 11:25 PM      Result Value Ref Range Status   Specimen Description BLOOD RIGHT HAND   Final   Special Requests BOTTLES DRAWN AEROBIC ONLY 10CC   Final   Culture  Setup Time     Final   Value: 04/30/2014 03:40     Performed at [facility]   Culture     Final   Value:        BLOOD CULTURE RECEIVED NO GROWTH TO DATE CULTURE WILL BE HELD FOR 5 DAYS BEFORE ISSUING A FINAL NEGATIVE REPORT     Performed at [facility]   Report Status PENDING   Incomplete     History of present illness and  Hospital Course:     Kindly see H&P for history of present illness and admission details, please review complete Labs, Consult reports and Test reports for all details in brief Jill Hernandez, is a 80 y.o. female, patient with history of hypertension, dyslipidemia, prediabetes, doesn't do to the hospital with an episode of syncope/passing out episode which happened at home, loss of consciousness around 5 minutes, in the process she hurt her left knee as well, she had no chest pain or palpitations, no focal deficits.    Upon arrival to the ER she had an unremarkable CT scan and MRI of the brain. Patient however did have an episode of tongue bite and had a bowel movement once she woke up after the episode of loss of consciousness. She was not orthostatic, awake EEG was unremarkable, she had no focal deficits, at this time my suspicion is patient either had a  vagal episode or had a seizure. Since her awake EEG is negative I have requested her to follow with neurologist in the outpatient setting to get a drowsy and awake EEG repeated, of note she does not drive.    For her stroke workup which was essentially unremarkable she had MRI of the brain and CT of the brain both were nonacute, carotid duplex unremarkable, she had an echogram which showed some nonspecific findings consistent with either a chronic effusion or pericardial fat pad. I have requested her to follow with a cardiologist in the outpatient setting to either repeat an echogram or to get a cardiac  MRI.   She currently is completely symptom free, for her underlying medical issues which include hypertension and dyslipidemia she will commence her home medications unchanged. Discussed the plan with the patient and daughter in detail.       Today   Subjective:   Jill Hernandez today has no headache,no chest abdominal pain,no new weakness tingling or numbness, feels much better wants to go home today.    Objective:   Blood pressure 146/62, pulse 66, temperature 98.1 F (36.7 C), temperature source Oral, resp. rate 20, height 5\' 3"  (1.6 m), weight 76.839 kg (169 lb 6.4 oz), SpO2 99.00%.   Intake/Output Summary (Last 24 hours) at 05/01/14 0848 Last data filed at 04/30/14 0900  Gross per 24 hour  Intake    240 ml  Output      0 ml  Net    240 ml    Exam Awake Alert, Oriented x 3, No new F.N deficits, Normal affect [place].AT,PERRAL Supple Neck,No JVD, No cervical lymphadenopathy appriciated.  Symmetrical Chest wall movement, Good air movement bilaterally, CTAB RRR,No Gallops,Rubs or new Murmurs, No Parasternal Heave +ve B.Sounds, Abd Soft, Non tender, No organomegaly appriciated, No rebound -guarding or rigidity. No Cyanosis, Clubbing or edema, No new Rash or bruise  Data Review   CBC w Diff: Lab Results  Component Value Date   WBC 6.3 05/01/2014   HGB 11.9* 05/01/2014   HCT 36.0  05/01/2014   PLT 175 05/01/2014   LYMPHOPCT 31 05/01/2014   MONOPCT 12 05/01/2014   EOSPCT 3 05/01/2014   BASOPCT 0 05/01/2014    CMP: Lab Results  Component Value Date   NA 140 05/01/2014   K 4.1 05/01/2014   CL 103 05/01/2014   CO2 26 05/01/2014   BUN 12 05/01/2014   CREATININE 1.09 05/01/2014   PROT 6.5 05/01/2014   ALBUMIN 3.1* 05/01/2014   BILITOT 0.9 05/01/2014   ALKPHOS 42 05/01/2014   AST 20 05/01/2014   ALT 16 05/01/2014  .   Total Time in preparing paper work, data evaluation and todays exam - 35 minutes  Leroy Sea M.D on 05/01/2014 at 8:48 AM  [facility] Office  [phone]   **Disclaimer: This note may have been dictated with voice recognition software. Similar sounding words can inadvertently be transcribed and this note may contain transcription errors which may not have been corrected upon publication of note.**

## 2014-05-01 NOTE — Progress Notes (Signed)
PT Cancellation Note  Patient Details Name: Jill Hernandez MRN: [id number] DOB: [birth year]   Cancelled Treatment:     Pt evaluated yesterday by PT. Pt at baseline level of mobility and is independent at this time. No further acute PT needs warranted. PT will sign off.    Nadara Mustard [place], [place]  [phone] 05/01/2014, 9:34 AM

## 2014-05-01 NOTE — Progress Notes (Signed)
Pt. DC'd home via car with daughter.  DC instructions given to both patient and daughter.  Vital signs and assessments were stable.

## 2014-05-01 NOTE — Progress Notes (Signed)
CARE MANAGEMENT NOTE 05/01/2014  Patient:  Jill Hernandez, Jill Hernandez   Account Number:  [id number]  Date Initiated:  05/01/2014  Documentation initiated by:  Jiles Crocker  Subjective/Objective Assessment:   ADMITTED WITH SEIZURES VS SYNCOPY     Action/Plan:   CM FOLLOWING FOR DCP   Anticipated DC Date:  05/03/2014   Anticipated DC Plan:  POSSIBLY HOME/SELF CARE     DC Planning Services  CM consult         Status of service:   Medicare Important Message given?   (If response is "NO", the following Medicare IM given date fields will be blank)  Per UR Regulation:  Reviewed for med. necessity/level of care/duration of stay  Comments:  6/5/2015Abelino Derrick RN,BSN,MHA [phone]

## 2014-05-02 LAB — URINE CULTURE

## 2014-05-06 ENCOUNTER — Ambulatory Visit (INDEPENDENT_AMBULATORY_CARE_PROVIDER_SITE_OTHER): Payer: Medicare Other | Admitting: Cardiology

## 2014-05-06 ENCOUNTER — Encounter: Payer: Self-pay | Admitting: Cardiology

## 2014-05-06 ENCOUNTER — Ambulatory Visit (INDEPENDENT_AMBULATORY_CARE_PROVIDER_SITE_OTHER): Payer: Medicare Other | Admitting: Radiology

## 2014-05-06 VITALS — BP 168/79 | HR 73 | Ht 63.0 in | Wt 171.0 lb

## 2014-05-06 DIAGNOSIS — R55 Syncope and collapse: Secondary | ICD-10-CM | POA: Diagnosis not present

## 2014-05-06 DIAGNOSIS — E7800 Pure hypercholesterolemia, unspecified: Secondary | ICD-10-CM

## 2014-05-06 DIAGNOSIS — I119 Hypertensive heart disease without heart failure: Secondary | ICD-10-CM | POA: Diagnosis not present

## 2014-05-06 LAB — CULTURE, BLOOD (ROUTINE X 2)
Culture: NO GROWTH
Culture: NO GROWTH

## 2014-05-06 NOTE — Progress Notes (Signed)
Jill Hernandez Date of Birth:  [birth year] [facility] [address] [place], [place]  [address] [phone]        Fax   [phone]   History of Present Illness: This pleasant 80 year old woman is seen for the first time by me in the office today.  The patient was recently hospitalized after experiencing syncope at home.  She was monitored on telemetry.  No cause for syncope was found.  She had an echocardiogram which was done on 04/30/14 and showed normal left ventricular function with systolic function and an ejection fraction of 60-65% and no wall motion abnormalities.  There was mild mitral regurgitation.  The pulmonary artery pressure was 35.  There was a prominent pericardial fat but no pericardial effusion.  Since admission she's had no further symptoms.  She was noted initially to have orthostatic hypotension which she attributed to dehydration and not drinking enough water.  Since admission she has made a point to drink plenty of water and to stay well hydrated and she has felt well.  She denies any chest pain or shortness of breath.  The patient does not have any history of known vascular heart disease.  She had a carotid duplex ultrasound while in the hospital which showed no significant carotid artery stenosis or obstruction. In hospital her hemoglobin on admission was normal on discharge was slightly low.  She has not had any history of hematochezia or melena.  She will be following up regarding her blood work with her PCP.  The patient has a history of mild diabetes and a history of hypertension and a history of hypercholesterolemia..  Current Outpatient Prescriptions  Medication Sig Dispense Refill  . aspirin EC 81 MG tablet Take 1 tablet (81 mg total) by mouth daily.  30 tablet  0  . carvedilol (COREG) 6.25 MG tablet Take 6.25 mg by mouth 2 (two) times daily with a meal.      . lisinopril (PRINIVIL,ZESTRIL) 20 MG tablet Take 20 mg by mouth daily.      Marland Kitchen  lovastatin (MEVACOR) 20 MG tablet Take 20 mg by mouth at bedtime.      . ranitidine (ZANTAC) 150 MG tablet Take 150 mg by mouth 2 (two) times daily.      Marland Kitchen trolamine salicylate (ASPERCREME) 10 % cream Apply 1 application topically as needed for muscle pain. Apply's to knee's       No current facility-administered medications for this visit.    Allergies  Allergen Reactions  . Sulfa Antibiotics Hives    Patient Active Problem List   Diagnosis Date Noted  . Syncope 04/29/2014  . TIA (transient ischemic attack) 04/29/2014  . HTN (hypertension) 04/29/2014  . HLD (hyperlipidemia) 04/29/2014  . Left knee pain 04/29/2014    History  Smoking status  . Former Smoker -- 20 years  . Types: Cigarettes  . Quit date: 04/30/1963  Smokeless tobacco  . Never Used    History  Alcohol Use  . Yes    Comment: 1 glass wine daily    Family History  Problem Relation Age of Onset  . Hypertension Mother   . Hyperlipidemia Father   . Hypertension Father     Review of Systems: Constitutional: no fever chills diaphoresis or fatigue or change in weight.  Head and neck: no hearing loss, no epistaxis, no photophobia or visual disturbance. Respiratory: No cough, shortness of breath or wheezing. Cardiovascular: No chest pain peripheral edema, palpitations. Gastrointestinal: No abdominal  distention, no abdominal pain, no change in bowel habits hematochezia or melena. Genitourinary: No dysuria, no frequency, no urgency, no nocturia. Musculoskeletal:No arthralgias, no back pain, no gait disturbance or myalgias. Neurological: No dizziness, no headaches, no numbness, no seizures, no syncope, no weakness, no tremors. Hematologic: No lymphadenopathy, no easy bruising. Psychiatric: No confusion, no hallucinations, no sleep disturbance.    Physical Exam: Filed Vitals:   05/06/14 1548  BP: 168/79  Pulse: 73   the patient attributes her elevated blood pressure today to the fact that she has been  seeing doctors all day today.  She saw her neurologist earlier today and had an EEG.The head and neck exam reveals pupils equal and reactive.  Extraocular movements are full.  There is no scleral icterus.  The mouth and pharynx are normal.  The neck is supple.  The carotids reveal no bruits.  The jugular venous pressure is normal.  The  thyroid is not enlarged.  There is no lymphadenopathy.  The chest is clear to percussion and auscultation.  There are no rales or rhonchi.  Expansion of the chest is symmetrical.  The precordium is quiet.  The first heart sound is normal.  The second heart sound is physiologically split.  There is no murmur gallop rub or click.  There is no abnormal lift or heave.  The abdomen is soft and nontender.  The bowel sounds are normal.  The liver and spleen are not enlarged.  There are no abdominal masses.  There are no abdominal bruits.  Extremities reveal good pedal pulses.  There is no phlebitis or edema.  There is no cyanosis or clubbing.  Strength is normal and symmetrical in all extremities.  There is no lateralizing weakness.  There are no sensory deficits.  The skin is warm and dry.  There is no rash.  EKG from hospitalization was reviewed and shows normal sinus rhythm and was within normal limits.   Assessment / Plan: 1. syncope probably secondary to orthostatic hypotension secondary to mild dehydration.  No evidence for cardiac etiology for her syncope. 2. essential hypertension 3. Hypercholesterolemia 4. glucose intolerance 5. mild anemia  Plan: No further cardiac workup needed.  Return here when necessary.

## 2014-05-06 NOTE — Patient Instructions (Signed)
Your physician recommends that you continue on your current medications as directed. Please refer to the Current Medication list given to you today.  Follow up as needed  

## 2014-05-06 NOTE — Procedures (Signed)
    History:  Jill Hernandez is an 80 year old patient with a history of a syncopal event that occurred in early June 2015. The patient stood up from a chair, and then felt dizzy and blacked out. The patient apparently bit her tongue. She is being evaluated for possible syncopal event versus seizure.  This is a routine EEG. No skull defects are noted. Medications include aspirin, Coreg, lisinopril, lovastatin, and Zantac.   EEG classification: Normal awake  Description of the recording: The background rhythms of this recording consists of a fairly well modulated medium amplitude alpha rhythm of 9 Hz that is reactive to eye opening and closure. As the record progresses, the patient appears to remain in the waking state throughout the recording. Photic stimulation was performed, resulting in a bilateral and symmetric photic driving response. Hyperventilation was not performed. At no time during the recording does there appear to be evidence of spike or spike wave discharges or evidence of focal slowing. EKG monitor shows no evidence of cardiac rhythm abnormalities with a heart rate of 60.  Impression: This is a normal EEG recording in the waking state. No evidence of ictal or interictal discharges are seen.

## 2014-05-08 DIAGNOSIS — R55 Syncope and collapse: Secondary | ICD-10-CM | POA: Diagnosis not present

## 2014-06-30 DIAGNOSIS — M25579 Pain in unspecified ankle and joints of unspecified foot: Secondary | ICD-10-CM | POA: Diagnosis not present

## 2014-08-10 DIAGNOSIS — K219 Gastro-esophageal reflux disease without esophagitis: Secondary | ICD-10-CM | POA: Diagnosis not present

## 2014-08-10 DIAGNOSIS — IMO0002 Reserved for concepts with insufficient information to code with codable children: Secondary | ICD-10-CM | POA: Diagnosis not present

## 2014-08-10 DIAGNOSIS — I10 Essential (primary) hypertension: Secondary | ICD-10-CM | POA: Diagnosis not present

## 2014-08-10 DIAGNOSIS — R609 Edema, unspecified: Secondary | ICD-10-CM | POA: Diagnosis not present

## 2014-08-10 DIAGNOSIS — M1710 Unilateral primary osteoarthritis, unspecified knee: Secondary | ICD-10-CM | POA: Diagnosis not present

## 2014-08-10 DIAGNOSIS — E785 Hyperlipidemia, unspecified: Secondary | ICD-10-CM | POA: Diagnosis not present

## 2014-08-15 ENCOUNTER — Encounter (HOSPITAL_COMMUNITY): Payer: Self-pay | Admitting: Emergency Medicine

## 2014-08-15 ENCOUNTER — Emergency Department (HOSPITAL_COMMUNITY): Payer: Medicare Other

## 2014-08-15 ENCOUNTER — Emergency Department (HOSPITAL_COMMUNITY)
Admission: EM | Admit: 2014-08-15 | Discharge: 2014-08-15 | Disposition: A | Discharge status: Home / Self Care | Payer: Medicare Other | Attending: Emergency Medicine | Admitting: Emergency Medicine

## 2014-08-15 DIAGNOSIS — K219 Gastro-esophageal reflux disease without esophagitis: Secondary | ICD-10-CM | POA: Insufficient documentation

## 2014-08-15 DIAGNOSIS — R05 Cough: Secondary | ICD-10-CM | POA: Insufficient documentation

## 2014-08-15 DIAGNOSIS — H5789 Other specified disorders of eye and adnexa: Secondary | ICD-10-CM | POA: Diagnosis not present

## 2014-08-15 DIAGNOSIS — E785 Hyperlipidemia, unspecified: Secondary | ICD-10-CM | POA: Diagnosis not present

## 2014-08-15 DIAGNOSIS — H1030 Unspecified acute conjunctivitis, unspecified eye: Secondary | ICD-10-CM | POA: Diagnosis not present

## 2014-08-15 DIAGNOSIS — H109 Unspecified conjunctivitis: Secondary | ICD-10-CM | POA: Diagnosis not present

## 2014-08-15 DIAGNOSIS — R059 Cough, unspecified: Secondary | ICD-10-CM | POA: Insufficient documentation

## 2014-08-15 DIAGNOSIS — I10 Essential (primary) hypertension: Secondary | ICD-10-CM | POA: Insufficient documentation

## 2014-08-15 DIAGNOSIS — Z87891 Personal history of nicotine dependence: Secondary | ICD-10-CM | POA: Insufficient documentation

## 2014-08-15 DIAGNOSIS — Z79899 Other long term (current) drug therapy: Secondary | ICD-10-CM | POA: Diagnosis not present

## 2014-08-15 DIAGNOSIS — Z7982 Long term (current) use of aspirin: Secondary | ICD-10-CM | POA: Diagnosis not present

## 2014-08-15 MED ORDER — ERYTHROMYCIN 5 MG/GM OP OINT: TOPICAL_OINTMENT | OPHTHALMIC | Status: DC

## 2014-08-15 MED ORDER — FLUORESCEIN SODIUM 1 MG OP STRP
1.0000 | ORAL_STRIP | Freq: Once | OPHTHALMIC | Status: AC
  Administered 2014-08-15: 1 via OPHTHALMIC
  Filled 2014-08-15: qty 1

## 2014-08-15 MED ORDER — TETRACAINE HCL 0.5 % OP SOLN
2.0000 [drp] | Freq: Once | OPHTHALMIC | Status: AC
  Administered 2014-08-15: 2 [drp] via OPHTHALMIC
  Filled 2014-08-15: qty 2

## 2014-08-15 NOTE — ED Notes (Signed)
Pt presents with c/o eye redness and drainage. Pt reports that earlier today she felt like something was in her eye and she attempted to flush it out and about 30 minutes later it started to ooze and is now very red and has a large amount of drainage. Pt also c/o cough and congestion since Wednesday of this week.

## 2014-08-15 NOTE — ED Notes (Signed)
Pt ambulating independently w/ steady gait on d/c in no acute distress, A&Ox4. D/c instructions reviewed w/ pt and family - pt and family deny any further questions or concerns at present. Rx given x1  

## 2014-08-15 NOTE — Discharge Instructions (Signed)

## 2014-08-15 NOTE — ED Notes (Signed)
Per pt and family - pt had a foreign body to rt eye, pt attempted to flush her eye and is now experiencing redness and drainage to rt eye. Pt's family also concerned about pt's upper respiratory congestion pt has recently been experiencing, pt lives in an independent living facility however other residents have recently been diagnosed w/ the flu.

## 2014-08-15 NOTE — ED Provider Notes (Signed)
CSN: [id number]     Arrival date & time 08/15/14  1925 History   First MD Initiated Contact with Patient 08/15/14 2144     Chief Complaint  Patient presents with  . Eye Drainage  . Cough     (Consider location/radiation/quality/duration/timing/severity/associated sxs/prior Treatment) Patient is a 80 y.o. female presenting with cough. The history is provided by the patient.  Cough Cough characteristics:  Productive Sputum characteristics:  Nondescript Severity:  Mild Onset quality:  Gradual Duration:  4 days Timing:  Constant Progression:  Unchanged Chronicity:  New Smoker: no   Context: upper respiratory infection   Relieved by:  Nothing Worsened by:  Nothing tried Ineffective treatments:  None tried Associated symptoms: eye discharge   Associated symptoms: no chest pain, no fever, no headaches and no shortness of breath     Past Medical History  Diagnosis Date  . Hypertension   . Hyperlipemia   . GERD (gastroesophageal reflux disease)    Past Surgical History  Procedure Laterality Date  . Abdominal hysterectomy    . Tonsillectomy     Family History  Problem Relation Age of Onset  . Hypertension Mother   . Hyperlipidemia Father   . Hypertension Father    History  Substance Use Topics  . Smoking status: Former Smoker -- 20 years    Types: Cigarettes    Quit date: 04/30/1963  . Smokeless tobacco: Never Used  . Alcohol Use: No   OB History   Grav Para Term Preterm Abortions TAB SAB Ect Mult Living                 Review of Systems  Constitutional: Negative for fever and fatigue.  HENT: Negative for congestion and drooling.   Eyes: Positive for discharge. Negative for pain.  Respiratory: Positive for cough. Negative for shortness of breath.   Cardiovascular: Negative for chest pain.  Gastrointestinal: Negative for nausea, vomiting, abdominal pain and diarrhea.  Genitourinary: Negative for dysuria and hematuria.  Musculoskeletal: Negative for back pain,  gait problem and neck pain.  Skin: Negative for color change.  Neurological: Negative for dizziness and headaches.  Hematological: Negative for adenopathy.  Psychiatric/Behavioral: Negative for behavioral problems.  All other systems reviewed and are negative.     Allergies  Sulfa antibiotics  Home Medications   Prior to Admission medications   Medication Sig Start Date End Date Taking? Authorizing Provider  acetaminophen (TYLENOL) 325 MG tablet Take 325-650 mg by mouth 4 (four) times daily as needed (for arthritis pain.). 325 mg in the morning, 650 mg at noon, 325 mg at 6:30p, 325 mg at bedtime   Yes Historical Provider, MD  aspirin EC 81 MG tablet Take 1 tablet (81 mg total) by mouth daily. 05/01/14  Yes Leroy Sea, MD  carvedilol (COREG) 6.25 MG tablet Take 6.25 mg by mouth 2 (two) times daily with a meal.   Yes Historical Provider, MD  Dextromethorphan-Guaifenesin (CORICIDIN HBP CONGESTION/COUGH) 10-200 MG CAPS Take 2 tablets by mouth every 4 (four) hours as needed (for could symptoms).   Yes Historical Provider, MD  lisinopril (PRINIVIL,ZESTRIL) 20 MG tablet Take 20 mg by mouth daily.   Yes Historical Provider, MD  lovastatin (MEVACOR) 20 MG tablet Take 20 mg by mouth at bedtime.   Yes Historical Provider, MD  ranitidine (ZANTAC) 150 MG tablet Take 150 mg by mouth 2 (two) times daily.   Yes Historical Provider, MD  trolamine salicylate (ASPERCREME) 10 % cream Apply 1 application topically as needed for  muscle pain. Apply's to knee's   Yes Historical Provider, MD   There were no vitals taken for this visit. Physical Exam  Nursing note and vitals reviewed. Constitutional: She is oriented to person, place, and time. She appears well-developed and well-nourished.  HENT:  Head: Normocephalic and atraumatic.  Mouth/Throat: Oropharynx is clear and moist. No oropharyngeal exudate.  Eyes: EOM are normal. Pupils are equal, round, and reactive to light.  Mild conjunctival injection in  the left eye.  Moderate conjunctival injection in the right eye.  Pupils are equal round and reactive. Normal range of motion of both eyes without pain.  Mild light brown mucus noted in the eyelashes, and at the lid margins. Mild swelling of the upper eye lid.   Right eye stained w/out evidence of abrasion or lesion. No evidence of fb under eye lid.   20/50 vision bilaterally w/ glasses.   Neck: Normal range of motion. Neck supple.  Cardiovascular: Normal rate, regular rhythm, normal heart sounds and intact distal pulses.  Exam reveals no gallop and no friction rub.   No murmur heard. Pulmonary/Chest: Effort normal and breath sounds normal. No respiratory distress. She has no wheezes.  Abdominal: Soft. Bowel sounds are normal. There is no tenderness. There is no rebound and no guarding.  Musculoskeletal: Normal range of motion. She exhibits no edema and no tenderness.  Neurological: She is alert and oriented to person, place, and time.  Skin: Skin is warm and dry.  Psychiatric: She has a normal mood and affect. Her behavior is normal.    ED Course  Procedures (including critical care time) Labs Review Labs Reviewed - No data to display  Imaging Review No results found.   EKG Interpretation None      MDM   Final diagnoses:  Conjunctivitis of right eye  Cough    10:02 PM 80 y.o. female who presents with a decreased sensation in her right eye which began around 3 PM today. She denies any injury to her eye or any activity consistent with getting a foreign body in her eye. She states that she has had a cough productive of yellow sputum for the last few days. She denies any chest pain, shortness of breath, or fever. She states that she tried to irrigate her eye with tap water. Since that time she has had some slight brown mucus production from the eye. She has some mild blurry vision in the right eye and believes it to be associated with the mucus. She denies any eye pain. She  states that several residents in her nursing home have had pinkeye recently. Her vital signs are unremarkable here. Will stain and evaluate the eye. Will get a screening chest x-ray.  20/50 vision bilaterally w/ glasses on.   10:56 PM: Eye sx likely viral conjunctivitis, will place on erythromycin ointment. Recommend pt f/u w/ her eye doctor in 2 days if no better. She declined CXR and would like to go home, I think this is reasonable as she has no cp/sob/fever and unremarkable VS.  I have discussed the diagnosis/risks/treatment options with the patient and family and believe the pt to be eligible for discharge home to follow-up with her eye doctor Monday if no better. We also discussed returning to the ED immediately if new or worsening sx occur. We discussed the sx which are most concerning (e.g., eye pain, change in vision, worsening drainage) that necessitate immediate return. Medications administered to the patient during their visit and any new prescriptions provided  to the patient are listed below.  Medications given during this visit Medications  fluorescein ophthalmic strip 1 strip (1 strip Right Eye Given 08/15/14 2215)  tetracaine (PONTOCAINE) 0.5 % ophthalmic solution 2 drop (2 drops Right Eye Given 08/15/14 2215)    Discharge Medication List as of 08/15/2014 10:59 PM    START taking these medications   Details  erythromycin ophthalmic ointment Place a 1/2 inch ribbon of ointment into the lower eyelid of the affected eye up to 6x per day for 7 days., Print         Purvis Sheffield, MD 08/16/14 1126

## 2014-08-18 DIAGNOSIS — J069 Acute upper respiratory infection, unspecified: Secondary | ICD-10-CM | POA: Diagnosis not present

## 2014-08-18 DIAGNOSIS — H109 Unspecified conjunctivitis: Secondary | ICD-10-CM | POA: Diagnosis not present

## 2014-08-31 DIAGNOSIS — Z23 Encounter for immunization: Secondary | ICD-10-CM | POA: Diagnosis not present

## 2014-09-03 DIAGNOSIS — M170 Bilateral primary osteoarthritis of knee: Secondary | ICD-10-CM | POA: Diagnosis not present

## 2014-09-03 DIAGNOSIS — Z1389 Encounter for screening for other disorder: Secondary | ICD-10-CM | POA: Diagnosis not present

## 2014-09-03 DIAGNOSIS — L989 Disorder of the skin and subcutaneous tissue, unspecified: Secondary | ICD-10-CM | POA: Diagnosis not present

## 2014-09-03 DIAGNOSIS — M79671 Pain in right foot: Secondary | ICD-10-CM | POA: Diagnosis not present

## 2015-02-08 DIAGNOSIS — R7301 Impaired fasting glucose: Secondary | ICD-10-CM | POA: Diagnosis not present

## 2015-02-08 DIAGNOSIS — I10 Essential (primary) hypertension: Secondary | ICD-10-CM | POA: Diagnosis not present

## 2015-02-08 DIAGNOSIS — E782 Mixed hyperlipidemia: Secondary | ICD-10-CM | POA: Diagnosis not present

## 2015-02-08 DIAGNOSIS — K219 Gastro-esophageal reflux disease without esophagitis: Secondary | ICD-10-CM | POA: Diagnosis not present

## 2015-02-08 DIAGNOSIS — M62838 Other muscle spasm: Secondary | ICD-10-CM | POA: Diagnosis not present

## 2015-04-13 DIAGNOSIS — K5900 Constipation, unspecified: Secondary | ICD-10-CM | POA: Diagnosis not present

## 2015-08-09 DIAGNOSIS — M170 Bilateral primary osteoarthritis of knee: Secondary | ICD-10-CM | POA: Diagnosis not present

## 2015-08-09 DIAGNOSIS — I10 Essential (primary) hypertension: Secondary | ICD-10-CM | POA: Diagnosis not present

## 2015-08-09 DIAGNOSIS — R7301 Impaired fasting glucose: Secondary | ICD-10-CM | POA: Diagnosis not present

## 2015-08-09 DIAGNOSIS — K219 Gastro-esophageal reflux disease without esophagitis: Secondary | ICD-10-CM | POA: Diagnosis not present

## 2015-09-11 DIAGNOSIS — K209 Esophagitis, unspecified: Secondary | ICD-10-CM | POA: Diagnosis not present

## 2015-09-11 DIAGNOSIS — R200 Anesthesia of skin: Secondary | ICD-10-CM | POA: Diagnosis not present

## 2015-09-16 DIAGNOSIS — Z23 Encounter for immunization: Secondary | ICD-10-CM | POA: Diagnosis not present

## 2016-02-10 DIAGNOSIS — K219 Gastro-esophageal reflux disease without esophagitis: Secondary | ICD-10-CM | POA: Diagnosis not present

## 2016-02-10 DIAGNOSIS — I10 Essential (primary) hypertension: Secondary | ICD-10-CM | POA: Diagnosis not present

## 2016-02-10 DIAGNOSIS — E782 Mixed hyperlipidemia: Secondary | ICD-10-CM | POA: Diagnosis not present

## 2016-02-10 DIAGNOSIS — R7301 Impaired fasting glucose: Secondary | ICD-10-CM | POA: Diagnosis not present

## 2016-02-26 ENCOUNTER — Observation Stay (HOSPITAL_COMMUNITY)
Admission: EM | Admit: 2016-02-26 | Discharge: 2016-02-28 | Disposition: A | Discharge status: Home / Self Care | Payer: Medicare Other | Attending: Internal Medicine | Admitting: Internal Medicine

## 2016-02-26 ENCOUNTER — Encounter (HOSPITAL_COMMUNITY): Payer: Self-pay | Admitting: *Deleted

## 2016-02-26 DIAGNOSIS — E785 Hyperlipidemia, unspecified: Secondary | ICD-10-CM | POA: Diagnosis not present

## 2016-02-26 DIAGNOSIS — I340 Nonrheumatic mitral (valve) insufficiency: Secondary | ICD-10-CM | POA: Diagnosis not present

## 2016-02-26 DIAGNOSIS — R202 Paresthesia of skin: Secondary | ICD-10-CM | POA: Diagnosis not present

## 2016-02-26 DIAGNOSIS — R7989 Other specified abnormal findings of blood chemistry: Secondary | ICD-10-CM | POA: Diagnosis present

## 2016-02-26 DIAGNOSIS — R209 Unspecified disturbances of skin sensation: Secondary | ICD-10-CM | POA: Insufficient documentation

## 2016-02-26 DIAGNOSIS — Z79899 Other long term (current) drug therapy: Secondary | ICD-10-CM | POA: Insufficient documentation

## 2016-02-26 DIAGNOSIS — R200 Anesthesia of skin: Secondary | ICD-10-CM | POA: Insufficient documentation

## 2016-02-26 DIAGNOSIS — K219 Gastro-esophageal reflux disease without esophagitis: Secondary | ICD-10-CM | POA: Diagnosis not present

## 2016-02-26 DIAGNOSIS — R778 Other specified abnormalities of plasma proteins: Secondary | ICD-10-CM | POA: Diagnosis not present

## 2016-02-26 DIAGNOSIS — G459 Transient cerebral ischemic attack, unspecified: Principal | ICD-10-CM | POA: Insufficient documentation

## 2016-02-26 DIAGNOSIS — R531 Weakness: Secondary | ICD-10-CM | POA: Diagnosis not present

## 2016-02-26 DIAGNOSIS — G319 Degenerative disease of nervous system, unspecified: Secondary | ICD-10-CM | POA: Diagnosis not present

## 2016-02-26 DIAGNOSIS — Z87891 Personal history of nicotine dependence: Secondary | ICD-10-CM | POA: Diagnosis not present

## 2016-02-26 DIAGNOSIS — R030 Elevated blood-pressure reading, without diagnosis of hypertension: Secondary | ICD-10-CM | POA: Diagnosis not present

## 2016-02-26 DIAGNOSIS — I739 Peripheral vascular disease, unspecified: Secondary | ICD-10-CM | POA: Diagnosis not present

## 2016-02-26 DIAGNOSIS — Z90710 Acquired absence of both cervix and uterus: Secondary | ICD-10-CM | POA: Diagnosis not present

## 2016-02-26 DIAGNOSIS — Z882 Allergy status to sulfonamides status: Secondary | ICD-10-CM | POA: Diagnosis not present

## 2016-02-26 DIAGNOSIS — I10 Essential (primary) hypertension: Secondary | ICD-10-CM | POA: Insufficient documentation

## 2016-02-26 DIAGNOSIS — R682 Dry mouth, unspecified: Secondary | ICD-10-CM | POA: Insufficient documentation

## 2016-02-26 DIAGNOSIS — Z7982 Long term (current) use of aspirin: Secondary | ICD-10-CM | POA: Diagnosis not present

## 2016-02-26 DIAGNOSIS — E871 Hypo-osmolality and hyponatremia: Secondary | ICD-10-CM | POA: Diagnosis present

## 2016-02-26 DIAGNOSIS — I6789 Other cerebrovascular disease: Secondary | ICD-10-CM | POA: Insufficient documentation

## 2016-02-26 DIAGNOSIS — M4802 Spinal stenosis, cervical region: Secondary | ICD-10-CM | POA: Insufficient documentation

## 2016-02-26 DIAGNOSIS — Z8249 Family history of ischemic heart disease and other diseases of the circulatory system: Secondary | ICD-10-CM | POA: Diagnosis not present

## 2016-02-26 DIAGNOSIS — I679 Cerebrovascular disease, unspecified: Secondary | ICD-10-CM | POA: Diagnosis present

## 2016-02-26 NOTE — ED Notes (Signed)
Brought in via EMS from [facility].  C/O numbness to left face, arm and leg at this time.  Per previous note c/o weakness.

## 2016-02-26 NOTE — ED Notes (Signed)
The pt arrived by [facility] from home with feeling weak no pain equal grips  Alert oriented skin warm and dry,  She is also feeling flushed and hot  cbg 126   Iv per ems

## 2016-02-27 ENCOUNTER — Observation Stay (HOSPITAL_COMMUNITY): Payer: Medicare Other

## 2016-02-27 ENCOUNTER — Encounter (HOSPITAL_COMMUNITY): Payer: Self-pay | Admitting: Emergency Medicine

## 2016-02-27 ENCOUNTER — Emergency Department (HOSPITAL_COMMUNITY): Payer: Medicare Other

## 2016-02-27 DIAGNOSIS — I10 Essential (primary) hypertension: Secondary | ICD-10-CM

## 2016-02-27 DIAGNOSIS — R200 Anesthesia of skin: Secondary | ICD-10-CM | POA: Diagnosis not present

## 2016-02-27 DIAGNOSIS — E871 Hypo-osmolality and hyponatremia: Secondary | ICD-10-CM | POA: Diagnosis present

## 2016-02-27 DIAGNOSIS — E785 Hyperlipidemia, unspecified: Secondary | ICD-10-CM | POA: Diagnosis not present

## 2016-02-27 DIAGNOSIS — G459 Transient cerebral ischemic attack, unspecified: Secondary | ICD-10-CM | POA: Diagnosis not present

## 2016-02-27 DIAGNOSIS — G458 Other transient cerebral ischemic attacks and related syndromes: Secondary | ICD-10-CM

## 2016-02-27 DIAGNOSIS — I6523 Occlusion and stenosis of bilateral carotid arteries: Secondary | ICD-10-CM | POA: Diagnosis not present

## 2016-02-27 LAB — DIFFERENTIAL
BASOS PCT: 0 %
Basophils Absolute: 0 10*3/uL (ref 0.0–0.1)
Eosinophils Absolute: 0.2 10*3/uL (ref 0.0–0.7)
Eosinophils Relative: 2 %
Lymphocytes Relative: 24 %
Lymphs Abs: 2.1 10*3/uL (ref 0.7–4.0)
MONOS PCT: 8 %
Monocytes Absolute: 0.7 10*3/uL (ref 0.1–1.0)
NEUTROS ABS: 5.8 10*3/uL (ref 1.7–7.7)
Neutrophils Relative %: 66 %

## 2016-02-27 LAB — URINALYSIS, ROUTINE W REFLEX MICROSCOPIC
BILIRUBIN URINE: NEGATIVE
GLUCOSE, UA: NEGATIVE mg/dL
Hgb urine dipstick: NEGATIVE
Ketones, ur: NEGATIVE mg/dL
Leukocytes, UA: NEGATIVE
NITRITE: NEGATIVE
PH: 7 (ref 5.0–8.0)
Protein, ur: 100 mg/dL — AB
Specific Gravity, Urine: 1.004 — ABNORMAL LOW (ref 1.005–1.030)

## 2016-02-27 LAB — I-STAT CHEM 8, ED
BUN: 10 mg/dL (ref 6–20)
CHLORIDE: 94 mmol/L — AB (ref 101–111)
Calcium, Ion: 1.08 mmol/L — ABNORMAL LOW (ref 1.13–1.30)
Creatinine, Ser: 1 mg/dL (ref 0.44–1.00)
GLUCOSE: 110 mg/dL — AB (ref 65–99)
HCT: 44 % (ref 36.0–46.0)
Hemoglobin: 15 g/dL (ref 12.0–15.0)
POTASSIUM: 3.9 mmol/L (ref 3.5–5.1)
Sodium: 130 mmol/L — ABNORMAL LOW (ref 135–145)
TCO2: 23 mmol/L (ref 0–100)

## 2016-02-27 LAB — COMPREHENSIVE METABOLIC PANEL
ALT: 13 U/L — AB (ref 14–54)
AST: 17 U/L (ref 15–41)
Albumin: 3.8 g/dL (ref 3.5–5.0)
Alkaline Phosphatase: 52 U/L (ref 38–126)
Anion gap: 10 (ref 5–15)
BUN: 8 mg/dL (ref 6–20)
CHLORIDE: 95 mmol/L — AB (ref 101–111)
CO2: 23 mmol/L (ref 22–32)
CREATININE: 1.05 mg/dL — AB (ref 0.44–1.00)
Calcium: 9.2 mg/dL (ref 8.9–10.3)
GFR calc Af Amer: 55 mL/min — ABNORMAL LOW (ref >60)
GFR calc non Af Amer: 48 mL/min — ABNORMAL LOW (ref >60)
Glucose, Bld: 110 mg/dL — ABNORMAL HIGH (ref 65–99)
Potassium: 3.8 mmol/L (ref 3.5–5.1)
SODIUM: 128 mmol/L — AB (ref 135–145)
Total Bilirubin: 1 mg/dL (ref 0.3–1.2)
Total Protein: 7.5 g/dL (ref 6.5–8.1)

## 2016-02-27 LAB — CBC
HCT: 40.5 % (ref 36.0–46.0)
Hemoglobin: 13.1 g/dL (ref 12.0–15.0)
MCH: 32.7 pg (ref 26.0–34.0)
MCHC: 32.3 g/dL (ref 30.0–36.0)
MCV: 101 fL — ABNORMAL HIGH (ref 78.0–100.0)
PLATELETS: 265 10*3/uL (ref 150–400)
RBC: 4.01 MIL/uL (ref 3.87–5.11)
RDW: 12.2 % (ref 11.5–15.5)
WBC: 8.8 10*3/uL (ref 4.0–10.5)

## 2016-02-27 LAB — RAPID URINE DRUG SCREEN, HOSP PERFORMED
Amphetamines: NOT DETECTED
BARBITURATES: NOT DETECTED
Benzodiazepines: NOT DETECTED
Cocaine: NOT DETECTED
Opiates: NOT DETECTED
TETRAHYDROCANNABINOL: NOT DETECTED

## 2016-02-27 LAB — URINE MICROSCOPIC-ADD ON

## 2016-02-27 LAB — ETHANOL

## 2016-02-27 LAB — APTT: APTT: 33 s (ref 24–37)

## 2016-02-27 LAB — I-STAT TROPONIN, ED: Troponin i, poc: 0.09 ng/mL (ref 0.00–0.08)

## 2016-02-27 LAB — PROTIME-INR
INR: 0.99 (ref 0.00–1.49)
Prothrombin Time: 13.3 seconds (ref 11.6–15.2)

## 2016-02-27 MED ORDER — HYDRALAZINE HCL 20 MG/ML IJ SOLN: 10.0000 mg | Freq: Four times a day (QID) | INTRAMUSCULAR | Status: DC | PRN

## 2016-02-27 MED ORDER — LISINOPRIL 20 MG PO TABS
20.0000 mg | ORAL_TABLET | Freq: Every day | ORAL | Status: DC
  Administered 2016-02-27 – 2016-02-28 (×2): 20 mg via ORAL
  Filled 2016-02-27 (×2): qty 1

## 2016-02-27 MED ORDER — ASPIRIN 81 MG PO CHEW
324.0000 mg | CHEWABLE_TABLET | Freq: Once | ORAL | Status: AC
  Administered 2016-02-27: 324 mg via ORAL
  Filled 2016-02-27: qty 4

## 2016-02-27 MED ORDER — SODIUM CHLORIDE 0.9 % IV BOLUS (SEPSIS)
500.0000 mL | Freq: Once | INTRAVENOUS | Status: AC
  Administered 2016-02-27: 500 mL via INTRAVENOUS

## 2016-02-27 MED ORDER — STROKE: EARLY STAGES OF RECOVERY BOOK
Freq: Once | Status: AC
  Administered 2016-02-27: 1
  Filled 2016-02-27: qty 1

## 2016-02-27 MED ORDER — PRAVASTATIN SODIUM 20 MG PO TABS
20.0000 mg | ORAL_TABLET | Freq: Every day | ORAL | Status: DC
  Administered 2016-02-27: 20 mg via ORAL
  Filled 2016-02-27: qty 1

## 2016-02-27 MED ORDER — HYDROMORPHONE HCL 1 MG/ML IJ SOLN: 0.5000 mg | INTRAMUSCULAR | Status: DC | PRN

## 2016-02-27 MED ORDER — FAMOTIDINE 20 MG PO TABS
20.0000 mg | ORAL_TABLET | Freq: Two times a day (BID) | ORAL | Status: DC
Start: 2016-02-27 — End: 2016-02-28
  Administered 2016-02-27 – 2016-02-28 (×3): 20 mg via ORAL
  Filled 2016-02-27 (×3): qty 1

## 2016-02-27 MED ORDER — SENNOSIDES-DOCUSATE SODIUM 8.6-50 MG PO TABS
1.0000 | ORAL_TABLET | Freq: Every evening | ORAL | Status: DC | PRN
Start: 2016-02-27 — End: 2016-02-28

## 2016-02-27 MED ORDER — LORAZEPAM 2 MG/ML IJ SOLN
1.0000 mg | Freq: Once | INTRAMUSCULAR | Status: AC
  Administered 2016-02-27: 1 mg via INTRAVENOUS
  Filled 2016-02-27: qty 1

## 2016-02-27 MED ORDER — ACETAMINOPHEN 325 MG PO TABS
650.0000 mg | ORAL_TABLET | Freq: Four times a day (QID) | ORAL | Status: DC | PRN
  Administered 2016-02-27: 650 mg via ORAL
  Filled 2016-02-27: qty 2

## 2016-02-27 MED ORDER — CARVEDILOL 6.25 MG PO TABS
6.2500 mg | ORAL_TABLET | Freq: Two times a day (BID) | ORAL | Status: DC
  Administered 2016-02-27 – 2016-02-28 (×2): 6.25 mg via ORAL
  Filled 2016-02-27 (×2): qty 1

## 2016-02-27 MED ORDER — ASPIRIN 325 MG PO TABS
325.0000 mg | ORAL_TABLET | Freq: Every day | ORAL | Status: DC
  Administered 2016-02-27 – 2016-02-28 (×2): 325 mg via ORAL
  Filled 2016-02-27 (×2): qty 1

## 2016-02-27 MED ORDER — ENOXAPARIN SODIUM 40 MG/0.4ML ~~LOC~~ SOLN
40.0000 mg | Freq: Every day | SUBCUTANEOUS | Status: DC
  Administered 2016-02-27 – 2016-02-28 (×2): 40 mg via SUBCUTANEOUS
  Filled 2016-02-27 (×4): qty 0.4

## 2016-02-27 MED ORDER — ASPIRIN 300 MG RE SUPP: 300.0000 mg | Freq: Every day | RECTAL | Status: DC

## 2016-02-27 NOTE — Progress Notes (Signed)
Progress Note   Jill Hernandez:[id number] DOB: 1930-11-11 DOA: 02/26/2016 PCP: Ethel Rana   Brief Narrative:   Jill Hernandez is an 82 y.o. female the PMH of hypertension and hyperlipidemia who was admitted 02/26/16 with TIA manifested by left sided body and facial paresthesias. EMS was called and found her blood pressure to be 215/87. CT done on admission was negative for acute findings. Neurology was subsequently consulted.  Assessment/Plan:   Principal Problem:   TIA (transient ischemic attack) Full stroke workup initiated. Neurologist following. Follow-up MRI/MRA of the brain, 2-D echo, and carotid Dopplers. Follow-up hemoglobin A1c, FLP. PT/OT and speech therapy consulted. Continue aspirin.  Active Problems:   HTN (hypertension) Continue carvedilol and lisinopril. Hydralazine ordered as needed for marked elevation of blood pressure.    HLD (hyperlipidemia) Continue statin therapy. LDL goal less than 70. Follow-up FLP.    Hyponatremia Mild, improving. Follow-up osmolality studies.    DVT Prophylaxis Lovenox ordered.   Family Communication/Anticipated D/C date and plan/Code Status   Family Communication: No family currently at the bedside. Disposition Plan/date: Home when stroke evaluation completed, likely tomorrow. Code Status: Full code.   Procedures and diagnostic studies:   Ct Head Wo Contrast  02/27/2016  CLINICAL DATA:  Acute onset of left-sided numbness and tingling. Initial encounter. EXAM: CT HEAD WITHOUT CONTRAST TECHNIQUE: Contiguous axial images were obtained from the base of the skull through the vertex without intravenous contrast. COMPARISON:  CT of the head and MRI of the brain performed 04/29/2014 FINDINGS: There is no evidence of acute infarction, mass lesion, or intra- or extra-axial hemorrhage on CT. Prominence of the ventricles and sulci reflects mild cortical volume loss. The brainstem and fourth ventricle are within normal limits. The  basal ganglia are unremarkable in appearance. The cerebral hemispheres demonstrate grossly normal gray-white differentiation. No mass effect or midline shift is seen. There is no evidence of fracture; visualized osseous structures are unremarkable in appearance. The visualized portions of the orbits are within normal limits. The paranasal sinuses and mastoid air cells are well-aerated. No significant soft tissue abnormalities are seen. IMPRESSION: 1. No acute intracranial pathology seen on CT. 2. Mild cortical volume loss noted. Electronically Signed   By: Roanna Raider M.D.   On: 02/27/2016 01:55    Medical Consultants:    Neurology  Anti-Infectives:   Anti-infectives    None      Subjective:   [name] report some ongoing left-sided facial and arm paresthesias. No headache. No dyspnea. No chest pain.  Objective:    Filed Vitals:   02/27/16 0845 02/27/16 0937 02/27/16 1135 02/27/16 1335  BP: 146/72 149/74 145/76 146/68  Pulse: 71 82 80 81  Temp:  97.9 F (36.6 C) 97.8 F (36.6 C) 97.6 F (36.4 C)  TempSrc:  Oral Oral Oral  Resp: Height:      Weight:      SpO2: 97% 100% 99%     Intake/Output Summary (Last 24 hours) at 02/27/16 1503 Last data filed at 02/27/16 1420  Gross per 24 hour  Intake    240 ml  Output   1900 ml  Net  -1660 ml   Filed Weights   02/26/16 2359  Weight: 72.576 kg (160 lb)    Exam: Gen:  NAD Cardiovascular:  RRR, No M/R/G Respiratory:  Lungs CTAB Gastrointestinal:  Abdomen soft, NT/ND, + BS Extremities:  No C/E/C Neuro: No focal motor deficits. No pronator drift. Cranial nerves  II through XII grossly intact.   Data Reviewed:    Labs: Basic Metabolic Panel:  Recent Labs Lab 02/27/16 0150 02/27/16 0159  NA 128* 130*  K 3.8 3.9  CL 95* 94*  CO2 23  --   GLUCOSE 110* 110*  BUN 8 10  CREATININE 1.05* 1.00  CALCIUM 9.2  --    GFR Estimated Creatinine Clearance: 37.9 mL/min (by C-G formula based on Cr of  1). Liver Function Tests:  Recent Labs Lab 02/27/16 0150  AST 17  ALT 13*  ALKPHOS 52  BILITOT 1.0  PROT 7.5  ALBUMIN 3.8   Coagulation profile  Recent Labs Lab 02/27/16 0150  INR 0.99    CBC:  Recent Labs Lab 02/27/16 0150 02/27/16 0159  WBC 8.8  --   NEUTROABS 5.8  --   HGB 13.1 15.0  HCT 40.5 44.0  MCV 101.0*  --   PLT 265  --    Hgb A1c: No results for input(s): HGBA1C in the last 72 hours. Lipid Profile: No results for input(s): CHOL, HDL, LDLCALC, TRIG, CHOLHDL, LDLDIRECT in the last 72 hours. Microbiology No results found for this or any previous visit (from the past 240 hour(s)).   Medications:   .  stroke: mapping our early stages of recovery book   Does not apply Once  . aspirin  300 mg Rectal Daily   Or  . aspirin  325 mg Oral Daily  . carvedilol  6.25 mg Oral BID WC  . enoxaparin (LOVENOX) injection  40 mg Subcutaneous Daily  . famotidine  20 mg Oral BID  . lisinopril  20 mg Oral Daily  . LORazepam  1 mg Intravenous Once  . pravastatin  20 mg Oral q1800   Continuous Infusions:   Time spent: 25 minutes.     [name]  [facility] Pager [phone]. If unable to reach me by pager, please call my cell phone at [phone].  *Please refer to [email or web address], password [email or web address] to get updated schedule on who will round on this patient, as hospitalists switch teams weekly. If 7PM-7AM, please contact night-coverage at [email or web address], password [email or web address] for any overnight needs.  02/27/2016, 3:03 PM

## 2016-02-27 NOTE — Progress Notes (Signed)
Accepted patient from ED, patient is alert and oriented, denies having any pain at this time, daughter at her side.

## 2016-02-27 NOTE — ED Provider Notes (Signed)
CSN: [id number]     Arrival date & time 02/26/16  2349 History  By signing my name below, I, Phillis Haggis, attest that this documentation has been prepared under the direction and in the presence of Jerelyn Scott, MD. Electronically Signed: Phillis Haggis, ED Scribe. 02/27/2016. 3:41 AM.   Chief Complaint  Patient presents with  . Weakness   Patient is a 82 y.o. female presenting with weakness. The history is provided by the patient. No language interpreter was used.  Weakness This is a new problem. The current episode started 3 to 5 hours ago. The problem has not changed since onset.Pertinent negatives include no headaches. She has tried nothing for the symptoms.  HPI Comments: Jill Hernandez is a 82 y.o. Female with a hx of HTN and hyperlipidemia brought in by EMS from [facility] brought in by EMS who presents to the Emergency Department complaining of gradually resolving, "burning" to the left face, left arm, and left leg onset 4 hours ago. She reports associated tingling to the left side that is gradually resolving, dry mouth, and left arm weakness that felt heavy. Per staff, pt's BP was 215/87. Pt received an IV en route. Pt has a hx of "mini stroke" x1. She denies facial droop, headache, or speech difficulty.  PCP: Ethel Rana  Past Medical History  Diagnosis Date  . Hypertension   . Hyperlipemia   . GERD (gastroesophageal reflux disease)    Past Surgical History  Procedure Laterality Date  . Abdominal hysterectomy    . Tonsillectomy     Family History  Problem Relation Age of Onset  . Hypertension Mother   . Hyperlipidemia Father   . Hypertension Father    Social History  Substance Use Topics  . Smoking status: Former Smoker -- 20 years    Types: Cigarettes    Quit date: 04/30/1963  . Smokeless tobacco: Never Used  . Alcohol Use: No   OB History    No data available     Review of Systems  Neurological: Positive for weakness and  numbness. Negative for facial asymmetry, speech difficulty and headaches.  All other systems reviewed and are negative.     Allergies  Sulfa antibiotics  Home Medications   Prior to Admission medications   Medication Sig Start Date End Date Taking? Authorizing Provider  acetaminophen (TYLENOL) 325 MG tablet Take 325-650 mg by mouth 4 (four) times daily as needed (for arthritis pain.). 325 mg in the morning, 650 mg at noon, 325 mg at 6:30p, 325 mg at bedtime   Yes Historical Provider, MD  aspirin EC 81 MG tablet Take 1 tablet (81 mg total) by mouth daily. 05/01/14  Yes Leroy Sea, MD  carvedilol (COREG) 6.25 MG tablet Take 6.25 mg by mouth 2 (two) times daily with a meal.    Historical Provider, MD  Dextromethorphan-Guaifenesin (CORICIDIN HBP CONGESTION/COUGH) 10-200 MG CAPS Take 2 tablets by mouth every 4 (four) hours as needed (for could symptoms).   Yes Historical Provider, MD  lisinopril (PRINIVIL,ZESTRIL) 20 MG tablet Take 20 mg by mouth daily.   Yes Historical Provider, MD  lovastatin (MEVACOR) 20 MG tablet Take 20 mg by mouth at bedtime.   Yes Historical Provider, MD  NADOLOL PO Take by mouth.   Yes Historical Provider, MD  ranitidine (ZANTAC) 150 MG tablet Take 150 mg by mouth 2 (two) times daily.   Yes Historical Provider, MD  trolamine salicylate (ASPERCREME) 10 % cream Apply 1 application topically as  needed for muscle pain. Apply's to knee's   Yes Historical Provider, MD   BP 215/87 mmHg  Pulse 77  Temp(Src) 98 F (36.7 C) (Oral)  Resp 18  Ht 5' (1.524 m)  Wt 72.576 kg  BMI 31.25 kg/m2  SpO2 95%  Vitals reviewed Physical Exam  Physical Examination: General appearance - alert, well appearing, and in no distress Mental status - alert, oriented to person, place, and time Eyes - pupils equal and reactive, extraocular eye movements intact Mouth - mucous membranes moist, pharynx normal without lesions Neck - supple, no significant adenopathy Chest - clear to  auscultation, no wheezes, rales or rhonchi, symmetric air entry Heart - normal rate, regular rhythm, normal S1, S2, no murmurs, rubs, clicks or gallops Abdomen - soft, nontender, nondistended, no masses or organomegaly Neurological - alert, oriented x 3, normal speech, cranial nerves 2-12 tested and intact, strength 5/5 in extremities x 4, sensation intact- but decreased sensation overlying left face, left forearm Extremities - peripheral pulses normal, no pedal edema, no clubbing or cyanosis Skin - normal coloration and turgor, no rashes  ED Course  Procedures (including critical care time) DIAGNOSTIC STUDIES: Oxygen Saturation is 95% on RA, normal by my interpretation.    COORDINATION OF CARE: 12:34 AM-Discussed treatment plan which includes CT scan and labs with pt at bedside and pt agreed to plan.    Labs Review Labs Reviewed  CBC - Abnormal; Notable for the following:    MCV 101.0 (*)    All other components within normal limits  COMPREHENSIVE METABOLIC PANEL - Abnormal; Notable for the following:    Sodium 128 (*)    Chloride 95 (*)    Glucose, Bld 110 (*)    Creatinine, Ser 1.05 (*)    ALT 13 (*)    GFR calc non Af Amer 48 (*)    GFR calc Af Amer 55 (*)    All other components within normal limits  URINALYSIS, ROUTINE W REFLEX MICROSCOPIC (NOT AT [facility]) - Abnormal; Notable for the following:    Color, Urine STRAW (*)    Specific Gravity, Urine 1.004 (*)    Protein, ur 100 (*)    All other components within normal limits  URINE MICROSCOPIC-ADD ON - Abnormal; Notable for the following:    Squamous Epithelial / LPF 0-5 (*)    Bacteria, UA FEW (*)    All other components within normal limits  I-STAT CHEM 8, ED - Abnormal; Notable for the following:    Sodium 130 (*)    Chloride 94 (*)    Glucose, Bld 110 (*)    Calcium, Ion 1.08 (*)    All other components within normal limits  I-STAT TROPOININ, ED - Abnormal; Notable for the following:    Troponin i, poc 0.09 (*)     All other components within normal limits  ETHANOL  PROTIME-INR  APTT  DIFFERENTIAL  URINE RAPID DRUG SCREEN, HOSP PERFORMED  OSMOLALITY, URINE  NA AND K (SODIUM & POTASSIUM), RAND UR    Imaging Review Ct Head Wo Contrast  02/27/2016  CLINICAL DATA:  Acute onset of left-sided numbness and tingling. Initial encounter. EXAM: CT HEAD WITHOUT CONTRAST TECHNIQUE: Contiguous axial images were obtained from the base of the skull through the vertex without intravenous contrast. COMPARISON:  CT of the head and MRI of the brain performed 04/29/2014 FINDINGS: There is no evidence of acute infarction, mass lesion, or intra- or extra-axial hemorrhage on CT. Prominence of the ventricles and sulci reflects  mild cortical volume loss. The brainstem and fourth ventricle are within normal limits. The basal ganglia are unremarkable in appearance. The cerebral hemispheres demonstrate grossly normal gray-white differentiation. No mass effect or midline shift is seen. There is no evidence of fracture; visualized osseous structures are unremarkable in appearance. The visualized portions of the orbits are within normal limits. The paranasal sinuses and mastoid air cells are well-aerated. No significant soft tissue abnormalities are seen. IMPRESSION: 1. No acute intracranial pathology seen on CT. 2. Mild cortical volume loss noted. Electronically Signed   By: Roanna RaiderJeffery  Chang M.D.   On: 02/27/2016 01:55   I have personally reviewed and evaluated these images and lab results as part of my medical decision-making.   EKG Interpretation   Date/Time:  Sunday February 27 2016 00:49:29 EDT Ventricular Rate:  71 PR Interval:  141 QRS Duration: 97 QT Interval:  400 QTC Calculation: 435 R Axis:   37 Text Interpretation:  Sinus rhythm EARLY R WAVE PROGRESSION No significant  change since last tracing Confirmed by [facility]  MD, Gee Habig [phone]([id number]) on  02/27/2016 1:56:58 AM      MDM   Final diagnoses:  Transient cerebral ischemia,  unspecified transient cerebral ischemia type  Essential hypertension  Hyponatremia  Elevated troponin    Pt presenting with c/o left face and arm numbness and tingling- symptoms started approx 8pm this evening- are improving and mostly resolved upon arrival to the ED.  No facial droop, no weakness, no changes in vision or speech.  Stroke workup started, CT head without acute findings.  Pt started on aspirin.  D/w neurology who will consult and triad for admission.    4:07 AM d/w neurology, Dr. Amada JupiterKirkpatrick, he will consult on the patient.    5:06 AM d/w Dr. Lovell SheehanJenkins, triad- pt to be admitted to obs tele bed.    I personally performed the services described in this documentation, which was scribed in my presence. The recorded information has been reviewed and is accurate.    Jerelyn ScottMartha Linker, MD 02/27/16 [phone]

## 2016-02-27 NOTE — H&P (Signed)
[facility] Admission History and Physical       HERO MCCATHERN ZOX:[id number] DOB: May 03, 1930 DOA: 02/26/2016  Referring physician: EDP PCP: Ethel Rana  Specialists:   Chief Complaint:  Numbness and Burning on Left Side  HPI: Jill Hernandez is a 82 y.o. female with a history of HTN, Hyperlipidemia who presents to the ED with complaints of sudden onset of numbness and burning on the left side of her face and of her left side that started at 1030 PM.   She reports she was about to go to sleep when the symptoms started.   EMS was called and her blood pressure was found to be 215/87.   When she arrived to the ED, her symptoms were beginiing to improve.   A CT scan of the head was performed and was negative for acute findings.   Neurology was consulted and she was referred for further evaluation.      Review of Systems:  Constitutional: No Weight Loss, No Weight Gain, Night Sweats, Fevers, Chills, Dizziness, Light Headedness, Fatigue, or Generalized Weakness HEENT: No Headaches, Difficulty Swallowing,Tooth/Dental Problems,Sore Throat,  No Sneezing, Rhinitis, Ear Ache, Nasal Congestion, or Post Nasal Drip,  Cardio-vascular:  No Chest pain, Orthopnea, PND, Edema in Lower Extremities, Anasarca, Dizziness, Palpitations  Resp: No Dyspnea, No DOE, No Productive Cough, No Non-Productive Cough, No Hemoptysis, No Wheezing.    GI: No Heartburn, Indigestion, Abdominal Pain, Nausea, Vomiting, Diarrhea, Constipation, Hematemesis, Hematochezia, Melena, Change in Bowel Habits,  Loss of Appetite  GU: No Dysuria, No Change in Color of Urine, No Urgency or Urinary Frequency, No Flank pain.  Musculoskeletal: No Joint Pain or Swelling, No Decreased Range of Motion, No Back Pain.  Neurologic: No Syncope, No Seizures, Muscle Weakness, Paresthesia, Vision Disturbance or Loss, No Diplopia, No Vertigo, No Difficulty Walking,  Skin: No Rash or Lesions. Psych: No Change in Mood or Affect, No  Depression or Anxiety, No Memory loss, No Confusion, or Hallucinations   Past Medical History  Diagnosis Date  . Hypertension   . Hyperlipemia   . GERD (gastroesophageal reflux disease)      Past Surgical History  Procedure Laterality Date  . Abdominal hysterectomy    . Tonsillectomy        Prior to Admission medications   Medication Sig Start Date End Date Taking? Authorizing Provider  acetaminophen (TYLENOL) 325 MG tablet Take 325-650 mg by mouth 4 (four) times daily as needed (for arthritis pain.). 325 mg in the morning, 650 mg at noon, 325 mg at 6:30p, 325 mg at bedtime   Yes Historical Provider, MD  aspirin EC 81 MG tablet Take 1 tablet (81 mg total) by mouth daily. 05/01/14  Yes Leroy Sea, MD  carvedilol (COREG) 6.25 MG tablet Take 6.25 mg by mouth 2 (two) times daily with a meal.    Historical Provider, MD  Dextromethorphan-Guaifenesin (CORICIDIN HBP CONGESTION/COUGH) 10-200 MG CAPS Take 2 tablets by mouth every 4 (four) hours as needed (for could symptoms).   Yes Historical Provider, MD  lisinopril (PRINIVIL,ZESTRIL) 20 MG tablet Take 20 mg by mouth daily.   Yes Historical Provider, MD  lovastatin (MEVACOR) 20 MG tablet Take 20 mg by mouth at bedtime.   Yes Historical Provider, MD  NADOLOL PO Take by mouth.   Yes Historical Provider, MD  ranitidine (ZANTAC) 150 MG tablet Take 150 mg by mouth 2 (two) times daily.   Yes Historical Provider, MD  trolamine salicylate (ASPERCREME) 10 % cream Apply 1 application topically  as needed for muscle pain. Apply's to knee's   Yes Historical Provider, MD     Allergies  Allergen Reactions  . Sulfa Antibiotics Hives    Social History:  reports that she quit smoking about 52 years ago. Her smoking use included Cigarettes. She quit after 20 years of use. She has never used smokeless tobacco. She reports that she does not drink alcohol or use illicit drugs.    Family History  Problem Relation Age of Onset  . Hypertension Mother   .  Hyperlipidemia Father   . Hypertension Father        Physical Exam:  GEN:    Pleasant Elderly Obese  82 y.o. Caucasian female examined and in no acute distress; cooperative with exam Filed Vitals:   02/26/16 2359  BP: 215/87  Pulse: 77  Temp: 98 F (36.7 C)  TempSrc: Oral  Resp: 18  Height: 5' (1.524 m)  Weight: 72.576 kg (160 lb)  SpO2: 95%   Blood pressure 215/87, pulse 77, temperature 98 F (36.7 C), temperature source Oral, resp. rate 18, height 5' (1.524 m), weight 72.576 kg (160 lb), SpO2 95 %. PSYCH: She is alert and oriented x4; does not appear anxious does not appear depressed; affect is normal HEENT: Normocephalic and Atraumatic, Mucous membranes pink; PERRLA; EOM intact; Fundi:  Benign;  No scleral icterus, Nares: Patent, Oropharynx: Clear, Fair Dentition,    Neck:  FROM, No Cervical Lymphadenopathy nor Thyromegaly or Carotid Bruit; No JVD; Breasts:: Not examined CHEST WALL: No tenderness CHEST: Normal respiration, clear to auscultation bilaterally HEART: Regular rate and rhythm; no murmurs rubs or gallops BACK: No kyphosis or scoliosis; No CVA tenderness ABDOMEN: Positive Bowel Sounds, Soft Non-Tender, No Rebound or Guarding; No Masses, No Organomegaly. Rectal Exam: Not done EXTREMITIES: No Cyanosis, Clubbing, or Edema; No Ulcerations. Genitalia: not examined PULSES: 2+ and symmetric SKIN: Normal hydration no rash or ulceration CNS:  Alert and Oriented x 4, No Focal Deficits Vascular: pulses palpable throughout    Labs on Admission:  Basic Metabolic Panel:  Recent Labs Lab 02/27/16 0150 02/27/16 0159  NA 128* 130*  K 3.8 3.9  CL 95* 94*  CO2 23  --   GLUCOSE 110* 110*  BUN 8 10  CREATININE 1.05* 1.00  CALCIUM 9.2  --    Liver Function Tests:  Recent Labs Lab 02/27/16 0150  AST 17  ALT 13*  ALKPHOS 52  BILITOT 1.0  PROT 7.5  ALBUMIN 3.8   No results for input(s): LIPASE, AMYLASE in the last 168 hours. No results for input(s): AMMONIA in  the last 168 hours. CBC:  Recent Labs Lab 02/27/16 0150 02/27/16 0159  WBC 8.8  --   NEUTROABS 5.8  --   HGB 13.1 15.0  HCT 40.5 44.0  MCV 101.0*  --   PLT 265  --    Cardiac Enzymes: No results for input(s): CKTOTAL, CKMB, CKMBINDEX, TROPONINI in the last 168 hours.  BNP (last 3 results) No results for input(s): BNP in the last 8760 hours.  ProBNP (last 3 results) No results for input(s): PROBNP in the last 8760 hours.  CBG: No results for input(s): GLUCAP in the last 168 hours.  Radiological Exams on Admission: Ct Head Wo Contrast  02/27/2016  CLINICAL DATA:  Acute onset of left-sided numbness and tingling. Initial encounter. EXAM: CT HEAD WITHOUT CONTRAST TECHNIQUE: Contiguous axial images were obtained from the base of the skull through the vertex without intravenous contrast. COMPARISON:  CT of the head  and MRI of the brain performed 04/29/2014 FINDINGS: There is no evidence of acute infarction, mass lesion, or intra- or extra-axial hemorrhage on CT. Prominence of the ventricles and sulci reflects mild cortical volume loss. The brainstem and fourth ventricle are within normal limits. The basal ganglia are unremarkable in appearance. The cerebral hemispheres demonstrate grossly normal gray-white differentiation. No mass effect or midline shift is seen. There is no evidence of fracture; visualized osseous structures are unremarkable in appearance. The visualized portions of the orbits are within normal limits. The paranasal sinuses and mastoid air cells are well-aerated. No significant soft tissue abnormalities are seen. IMPRESSION: 1. No acute intracranial pathology seen on CT. 2. Mild cortical volume loss noted. Electronically Signed   By: Roanna Raider M.D.   On: 02/27/2016 01:55       Assessment/Plan:      82 y.o. female with  Principal Problem:    TIA (transient ischemic attack)    TIA Workup    Cardiac Monitoring    MRI/MRA Brain, Carotid US, and 2D ECHO  ordered    Neuro Checks    Neurology Consulted    ASA Rx   Active Problems:    HTN (hypertension)    PRN IV Hydralazine for SBP > 170    Continue Carvedilol, and Lisinopril RX    Monitor BPs      Hyponatremia    Send Urine Osm, and Urine Electrolytes    IVFs with NSS      HLD (hyperlipidemia)    Continue Statin Rx      DVT Prophylaxis    Lovenox   Code Status:     FULL CODE     Family Communication:   Daughter at Bedside  Disposition Plan:   Observation Status        Time spent: 19 Minutes      Ron Parker [facility] Pager [phone]   If 7AM -7PM Please Contact the Day Rounding Team MD for [facility]  If 7PM-7AM, Please Contact Night-Floor Coverage  [email or web address] Password [email or web address] 02/27/2016, 5:06 AM     ADDENDUM:   Patient was seen and examined on 02/27/2016

## 2016-02-27 NOTE — Progress Notes (Signed)
STROKE TEAM PROGRESS NOTE   HISTORY OF PRESENT ILLNESS Jill Hernandez is a 82 y.o. female with a history of hypertension and hyperlipidemia who presents with transient left-sided numbness involving the face and arm. She states that around 9:00, she admits that her left face and arm felt funny and tingling. She subsequently called for someone at the nursing home and took her blood pressure and found it to be very elevated. EMS was called and she was transported for further evaluation. Her symptoms began improving and transport and she was essentially back to baseline by around 11 PM   LKW: 9 PM tpa given?: no, rapidly improving mild symptoms   SUBJECTIVE (INTERVAL HISTORY) She still has some numbness and tingling and burning in the left arm and left side of the face. Daughter at bedside. Pending workup, patient admitted this morning.   OBJECTIVE Temp:  [98 F (36.7 C)] 98 F (36.7 C) (04/01 2359) Pulse Rate:  [75-77] 75 (04/02 0715) Cardiac Rhythm:  [-] Normal sinus rhythm (04/02 0239) Resp:  [15-18] 15 (04/02 0715) BP: (153-215)/(74-87) 153/74 mmHg (04/02 0715) SpO2:  [95 %] 95 % (04/02 0715) Weight:  [72.576 kg (160 lb)] 72.576 kg (160 lb) (04/01 2359)  CBC:  Recent Labs Lab 02/27/16 0150 02/27/16 0159  WBC 8.8  --   NEUTROABS 5.8  --   HGB 13.1 15.0  HCT 40.5 44.0  MCV 101.0*  --   PLT 265  --     Basic Metabolic Panel:  Recent Labs Lab 02/27/16 0150 02/27/16 0159  NA 128* 130*  K 3.8 3.9  CL 95* 94*  CO2 23  --   GLUCOSE 110* 110*  BUN 8 10  CREATININE 1.05* 1.00  CALCIUM 9.2  --     Lipid Panel: No results found for: CHOL, TRIG, HDL, CHOLHDL, VLDL, LDLCALC HgbA1c: No results found for: HGBA1C Urine Drug Screen:    Component Value Date/Time   LABOPIA NONE DETECTED 02/27/2016 0250   COCAINSCRNUR NONE DETECTED 02/27/2016 0250   LABBENZ NONE DETECTED 02/27/2016 0250   AMPHETMU NONE DETECTED 02/27/2016 0250   THCU NONE DETECTED 02/27/2016 0250   LABBARB  NONE DETECTED 02/27/2016 0250      IMAGING  Ct Head Wo Contrast 02/27/2016   1. No acute intracranial pathology seen on CT.  2. Mild cortical volume loss noted.     MRI/MRA Head - pending      PHYSICAL EXAM  Physical exam: Exam: Gen: NAD Eyes: anicteric sclerae, moist conjunctivae                    CV: no MRG, no carotid bruits, no peripheral edema Mental Status: Alert, follows commands, oriented to person, place, month, year, and situation, good historian  Neuro: Detailed Neurologic Exam  Speech:    No aphasia, no dysarthria  Cranial Nerves:    The pupils are equal, round, and reactive to light.. Attempted, Fundi not visualized.  EOMI. No gaze preference. Visual fields full. Face symmetric on smile,Dec sensation left face.  Tongue midline. Hearing intact to voice. Shoulder shrug intact  Motor Observation:    no involuntary movements noted. Tone appears normal.     Strength:    UE 5/5, difficult to test lowers due to arthritis but can lift legs off the bed without drift      Sensation:  Decreased on left arm and left face  Plantars downgoing.   Reflexes: AJs absent, Defered patellars due to arthritis. Biceps 1+.  ASSESSMENT/PLAN Jill Hernandez is a 82 y.o. female with history of hypertension and hyperlipidemia presenting with left-sided numbness and elevated blood pressure. She did not receive IV t-PA due to resolution of deficits.  Possible TIA:  Non-dominant   Resultant  deficits resolved  MRI  pending  MRA  pending  Carotid Doppler pending  2D Echo  pending  LDL pending  HgbA1c pending   VTE prophylaxis - Lovenox  Diet Heart Room service appropriate?: Yes; Fluid consistency:: Thin  aspirin 81 mg daily prior to admission, now on aspirin 325 mg daily  Patient counseled to be compliant with her antithrombotic medications  Ongoing aggressive stroke risk factor management  Therapy recommendations: pending  Disposition:   Pending  Hypertension  Stable     Hyperlipidemia  Home meds:  Mevacor 20 mg daily resumed in hospital  LDL pending, goal < 70    Other Stroke Risk Factors  Advanced age  Cigarette smoker, quit smoking 52 years ago.  Obesity, Body mass index is 31.25 kg/(m^2).      Other Active Problems  Hyponatremia  Hospital day #    Personally examined patient and images, and have participated in and made any corrections needed to history, physical, neuro exam,assessment and plan as stated above.  I have personally obtained the history, evaluated lab date, reviewed imaging studies and agree with radiology interpretations.    Jill Dean, MD Stroke Neurology [phone] [facility]       To contact Stroke Continuity provider, please refer to [email or web address]. After hours, contact General Neurology

## 2016-02-27 NOTE — ED Notes (Signed)
Attempted to call report to 5C 

## 2016-02-27 NOTE — Consult Note (Signed)
Neurology Consultation Reason for Consult: Transient left-sided numbness Referring Physician: Mort Sawyers  CC: Transient left-sided numbness  History is obtained from: Patient, daughter  HPI: Jill Hernandez is a 82 y.o. female with a history of hypertension, hyperlipidemia who presents with transient left-sided numbness involving the face and arm. She states that around 9:00, she admits that her left face and arm felt funny and tingling. She subsequently called for someone at the nursing home and took her blood pressure and found it to be very elevated. EMS was called and she was transported for further evaluation. Her symptoms began improving and transport and she was essentially back to baseline by around 11 PM   LKW: 9 PM tpa given?: no, rapidly improving mild symptoms    ROS: A 14 point ROS was performed and is negative except as noted in the HPI.   Past Medical History  Diagnosis Date  . Hypertension   . Hyperlipemia   . GERD (gastroesophageal reflux disease)      Family History  Problem Relation Age of Onset  . Hypertension Mother   . Hyperlipidemia Father   . Hypertension Father      Social History:  reports that she quit smoking about 52 years ago. Her smoking use included Cigarettes. She quit after 20 years of use. She has never used smokeless tobacco. She reports that she does not drink alcohol or use illicit drugs.   Exam: Current vital signs: BP 215/87 mmHg  Pulse 77  Temp(Src) 98 F (36.7 C) (Oral)  Resp 18  Ht 5' (1.524 m)  Wt 72.576 kg (160 lb)  BMI 31.25 kg/m2  SpO2 95% Vital signs in last 24 hours: Temp:  [98 F (36.7 C)] 98 F (36.7 C) (04/01 2359) Pulse Rate:  [77] 77 (04/01 2359) Resp:  [18] 18 (04/01 2359) BP: (215)/(87) 215/87 mmHg (04/01 2359) SpO2:  [95 %] 95 % (04/01 2359) Weight:  [72.576 kg (160 lb)] 72.576 kg (160 lb) (04/01 2359)   Physical Exam  Constitutional: Appears well-developed and well-nourished.  Psych: Affect  appropriate to situation Eyes: No scleral injection HENT: No OP obstrucion Head: Normocephalic.  Cardiovascular: Normal rate and regular rhythm.  Respiratory: Effort normal and breath sounds normal to anterior ascultation GI: Soft.  No distension. There is no tenderness.  Skin: WDI  Neuro: Mental Status: Patient is awake, alert, oriented to person, place, month, year, and situation. Patient is able to give a clear and coherent history. No signs of aphasia or neglect Cranial Nerves: II: Visual Fields are full. Pupils are equal, round, and reactive to light.   III,IV, VI: EOMI without ptosis or diploplia.  V: Facial sensation is symmetric to temperature VII: Facial movement is symmetric.  VIII: hearing is intact to voice X: Uvula elevates symmetrically XI: Shoulder shrug is symmetric. XII: tongue is midline without atrophy or fasciculations.  Motor: Tone is normal. Bulk is normal. 5/5 strength was present in all 4 extremities, however proximal strength was difficult to test in her legs due to arthritis. Sensory: Sensation is symmetric to light touch and temperature in the arms. She has some numbness in the lateral aspect of her left lower leg which is baseline and long-standing. Cerebellar: FNF intact bilaterally         I have reviewed labs in epic and the results pertinent to this consultation are: Hyponatremia at 128  I have reviewed the images obtained:  CT head-no acute findings  Impression: 82 year old female with transient left arm and face  numbness associated with severe hypertension. I suspect that this was TIA with hypertensive response. She will need further workup to lower her risk of conversion to ischemic stroke.  Recommendations: 1. HgbA1c, fasting lipid panel 2. MRI, MRA  of the brain without contrast 3. Frequent neuro checks 4. Echocardiogram 5. Carotid dopplers 6. Prophylactic therapy-Antiplatelet med: Aspirin - dose 325mg  PO or 300mg  PR 7. Risk  factor modification 8. Telemetry monitoring 9. PT consult, OT consult, Speech consult 10. please page stroke NP  Or  PA  Or MD  M-F from 8am -4 pm starting 4/2 as this patient will be followed by the stroke team at this point.   You can look them up on [email or web address]      Ritta SlotMcNeill Graycie Halley, MD [facility] [phone]  If 7pm- 7am, please page neurology on call as listed in AMION.

## 2016-02-28 ENCOUNTER — Observation Stay (HOSPITAL_BASED_OUTPATIENT_CLINIC_OR_DEPARTMENT_OTHER): Payer: Medicare Other

## 2016-02-28 DIAGNOSIS — G459 Transient cerebral ischemic attack, unspecified: Secondary | ICD-10-CM | POA: Diagnosis not present

## 2016-02-28 DIAGNOSIS — I679 Cerebrovascular disease, unspecified: Secondary | ICD-10-CM | POA: Diagnosis present

## 2016-02-28 DIAGNOSIS — G458 Other transient cerebral ischemic attacks and related syndromes: Secondary | ICD-10-CM | POA: Diagnosis not present

## 2016-02-28 DIAGNOSIS — G319 Degenerative disease of nervous system, unspecified: Secondary | ICD-10-CM

## 2016-02-28 DIAGNOSIS — E871 Hypo-osmolality and hyponatremia: Secondary | ICD-10-CM | POA: Diagnosis not present

## 2016-02-28 DIAGNOSIS — I10 Essential (primary) hypertension: Secondary | ICD-10-CM | POA: Diagnosis not present

## 2016-02-28 DIAGNOSIS — E785 Hyperlipidemia, unspecified: Secondary | ICD-10-CM | POA: Diagnosis not present

## 2016-02-28 LAB — BASIC METABOLIC PANEL
ANION GAP: 9 (ref 5–15)
BUN: 8 mg/dL (ref 6–20)
CALCIUM: 9.8 mg/dL (ref 8.9–10.3)
CO2: 24 mmol/L (ref 22–32)
CREATININE: 1 mg/dL (ref 0.44–1.00)
Chloride: 100 mmol/L — ABNORMAL LOW (ref 101–111)
GFR calc Af Amer: 59 mL/min — ABNORMAL LOW (ref >60)
GFR, EST NON AFRICAN AMERICAN: 51 mL/min — AB (ref >60)
GLUCOSE: 99 mg/dL (ref 65–99)
Potassium: 3.9 mmol/L (ref 3.5–5.1)
Sodium: 133 mmol/L — ABNORMAL LOW (ref 135–145)

## 2016-02-28 LAB — ECHOCARDIOGRAM COMPLETE
Height: 60 in
WEIGHTICAEL: 2560 [oz_av]

## 2016-02-28 LAB — LIPID PANEL
CHOL/HDL RATIO: 3.1 ratio
CHOLESTEROL: 148 mg/dL (ref 0–200)
HDL: 47 mg/dL (ref >40)
LDL CALC: 68 mg/dL (ref 0–99)
TRIGLYCERIDES: 163 mg/dL — AB (ref <150)
VLDL: 33 mg/dL (ref 0–40)

## 2016-02-28 LAB — NA AND K (SODIUM & POTASSIUM), RAND UR
Potassium Urine: 6 mmol/L
Sodium, Ur: <10 mmol/L

## 2016-02-28 LAB — OSMOLALITY, URINE: Osmolality, Ur: 92 mOsm/kg — ABNORMAL LOW (ref 300–900)

## 2016-02-28 NOTE — Care Management Note (Signed)
Case Management Note  Patient Details  Name: Adonis BrookSandra B Asman MRN: [id number] Date of Birth: 26-Sep-1930  Subjective/Objective:     Patient admitted to r/o CVA. MRI results negative. She is from [facility].                Action/Plan: Plan is for her to return to ALF when medically ready. CM continuing to follow for d/c needs.  Expected Discharge Date:                  Expected Discharge Plan:  Assisted Living / Rest Home  In-House Referral:     Discharge planning Services     Post Acute Care Choice:    Choice offered to:     DME Arranged:    DME Agency:     HH Arranged:    HH Agency:     Status of Service:  In process, will continue to follow  Medicare Important Message Given:    Date Medicare IM Given:    Medicare IM give by:    Date Additional Medicare IM Given:    Additional Medicare Important Message give by:     If discussed at Long Length of Stay Meetings, dates discussed:    Additional Comments:  Kermit BaloKelli F Beau Ramsburg, RN 02/28/2016, 2:23 PM

## 2016-02-28 NOTE — Progress Notes (Signed)
Progress Note   STEPHANEE BARCOMB ZOX:[id number] DOB: February 11, 1930 DOA: 02/26/2016 PCP: Ethel Rana   Brief Narrative:   MECHEL HAGGARD is an 82 y.o. female the PMH of hypertension and hyperlipidemia who was admitted 02/26/16 with TIA manifested by left sided body and facial paresthesias. EMS was called and found her blood pressure to be 215/87. CT done on admission was negative for acute findings. Neurology was subsequently consulted.  Assessment/Plan:   Principal Problem:   TIA (transient ischemic attack)/cerebrovascular small vessel disease/cerebral atrophy Full stroke workup initiated. Neurologist following. MRI negative for acute infarction. There was global atrophy and mild to moderate small vessel disease. 2-D echo and carotid Dopplers pending. Follow-up hemoglobin A1c. LDL 68, controlled. PT/OT and speech therapy consulted. Continue aspirin.  Active Problems:   HTN (hypertension) Continue carvedilol and lisinopril. Hydralazine ordered as needed for marked elevation of blood pressure.    HLD (hyperlipidemia) Continue statin therapy. LDL goal less than 70, at goal.     Hyponatremia Mild, improving. Urine osmolality low. Urine sodium less than 10.    DVT Prophylaxis Lovenox ordered.   Family Communication/Anticipated D/C date and plan/Code Status   Family Communication: No family currently at the bedside. Disposition Plan/date: Home when stroke evaluation completed, likely tomorrow. Code Status: Full code.   Procedures and diagnostic studies:   Ct Head Wo Contrast  02/27/2016  CLINICAL DATA:  Acute onset of left-sided numbness and tingling. Initial encounter. EXAM: CT HEAD WITHOUT CONTRAST TECHNIQUE: Contiguous axial images were obtained from the base of the skull through the vertex without intravenous contrast. COMPARISON:  CT of the head and MRI of the brain performed 04/29/2014 FINDINGS: There is no evidence of acute infarction, mass lesion, or intra- or  extra-axial hemorrhage on CT. Prominence of the ventricles and sulci reflects mild cortical volume loss. The brainstem and fourth ventricle are within normal limits. The basal ganglia are unremarkable in appearance. The cerebral hemispheres demonstrate grossly normal gray-white differentiation. No mass effect or midline shift is seen. There is no evidence of fracture; visualized osseous structures are unremarkable in appearance. The visualized portions of the orbits are within normal limits. The paranasal sinuses and mastoid air cells are well-aerated. No significant soft tissue abnormalities are seen. IMPRESSION: 1. No acute intracranial pathology seen on CT. 2. Mild cortical volume loss noted. Electronically Signed   By: Roanna Raider M.D.   On: 02/27/2016 01:55   Mr Brain Wo Contrast  02/27/2016  CLINICAL DATA:  Patient with hypertension and hyperlipidemia was admitted 02/26/16 with a TIA manifest by LEFT-sided body and facial paresthesias. EXAM: MRI HEAD WITHOUT CONTRAST MRA HEAD WITHOUT CONTRAST TECHNIQUE: Multiplanar, multiecho pulse sequences of the brain and surrounding structures were obtained without intravenous contrast. Angiographic images of the head were obtained using MRA technique without contrast. COMPARISON:  CT head 02/27/2016.  MR head 04/29/2014. FINDINGS: MRI HEAD FINDINGS The patient was unable to remain motionless for the exam. Small or subtle lesions could be overlooked. No evidence for acute infarction, hemorrhage, mass lesion, hydrocephalus, or extra-axial fluid. Mild cerebral and cerebellar atrophy. Mild to moderate subcortical and periventricular T2 and FLAIR hyperintensities, likely chronic microvascular ischemic change. Ischemic demyelination is noted in the mid pons bilaterally. Normal pituitary. Cerebellar tonsillar ectopia. Moderate pannus surrounds the odontoid. Moderate to severe stenosis at C2-3 related to central protrusion and posterior element hypertrophy incompletely  evaluated. Mild anterolisthesis C3-4 incompletely evaluated. Severe disc space narrowing C4-5 with suspected stenosis, incompletely evaluated. Consider cervical spine MRI for  further evaluation. Flow voids are maintained. Tiny focus chronic hemorrhage RIGHT cerebellar tonsil, could represent sequelae of hypertensive cerebrovascular disease. Extracranial soft tissues unremarkable. Compared with 2015, small vessel disease has progressed, notably in the mid pons. MRA HEAD FINDINGS Anterior circulation: Widely patent cervical, and petrous ICA segments. BILATERAL 50-75% stenosis of the internal carotid arteries at the junction of the cavernous and supraclinoid ICAs. ICA termini widely patent. No A1 or M1 stenosis. No MCA branch occlusion. Posterior circulation: Vertebral arteries are widely patent and contribute equally to the formation of basilar. No cerebral branch stenosis or occlusion. Fetal origin LEFT PCA with rudimentary LEFT P1 connector. No intracranial aneurysm. IMPRESSION: Global atrophy. Mild to moderate small vessel disease. No acute intracranial findings. Multiple potentially compressive abnormalities related to degenerative disc disease in the cervical spine, including pannus surrounding the odontoid. If there are signs and symptoms of radiculopathy or myelopathy, consider cervical spine MRI for further evaluation. BILATERAL 50-75% stenoses of the cervical internal carotid arteries, at the junction of the cavernous/supraclinoid ICAs. These are not necessarily flow reducing nor are they an unusual finding given the patient's stated age. Electronically Signed   By: Elsie Stain M.D.   On: 02/27/2016 18:28   Mr Maxine Glenn Head/brain Wo Cm  02/27/2016  CLINICAL DATA:  Patient with hypertension and hyperlipidemia was admitted 02/26/16 with a TIA manifest by LEFT-sided body and facial paresthesias. EXAM: MRI HEAD WITHOUT CONTRAST MRA HEAD WITHOUT CONTRAST TECHNIQUE: Multiplanar, multiecho pulse sequences of the brain  and surrounding structures were obtained without intravenous contrast. Angiographic images of the head were obtained using MRA technique without contrast. COMPARISON:  CT head 02/27/2016.  MR head 04/29/2014. FINDINGS: MRI HEAD FINDINGS The patient was unable to remain motionless for the exam. Small or subtle lesions could be overlooked. No evidence for acute infarction, hemorrhage, mass lesion, hydrocephalus, or extra-axial fluid. Mild cerebral and cerebellar atrophy. Mild to moderate subcortical and periventricular T2 and FLAIR hyperintensities, likely chronic microvascular ischemic change. Ischemic demyelination is noted in the mid pons bilaterally. Normal pituitary. Cerebellar tonsillar ectopia. Moderate pannus surrounds the odontoid. Moderate to severe stenosis at C2-3 related to central protrusion and posterior element hypertrophy incompletely evaluated. Mild anterolisthesis C3-4 incompletely evaluated. Severe disc space narrowing C4-5 with suspected stenosis, incompletely evaluated. Consider cervical spine MRI for further evaluation. Flow voids are maintained. Tiny focus chronic hemorrhage RIGHT cerebellar tonsil, could represent sequelae of hypertensive cerebrovascular disease. Extracranial soft tissues unremarkable. Compared with 2015, small vessel disease has progressed, notably in the mid pons. MRA HEAD FINDINGS Anterior circulation: Widely patent cervical, and petrous ICA segments. BILATERAL 50-75% stenosis of the internal carotid arteries at the junction of the cavernous and supraclinoid ICAs. ICA termini widely patent. No A1 or M1 stenosis. No MCA branch occlusion. Posterior circulation: Vertebral arteries are widely patent and contribute equally to the formation of basilar. No cerebral branch stenosis or occlusion. Fetal origin LEFT PCA with rudimentary LEFT P1 connector. No intracranial aneurysm. IMPRESSION: Global atrophy. Mild to moderate small vessel disease. No acute intracranial findings.  Multiple potentially compressive abnormalities related to degenerative disc disease in the cervical spine, including pannus surrounding the odontoid. If there are signs and symptoms of radiculopathy or myelopathy, consider cervical spine MRI for further evaluation. BILATERAL 50-75% stenoses of the cervical internal carotid arteries, at the junction of the cavernous/supraclinoid ICAs. These are not necessarily flow reducing nor are they an unusual finding given the patient's stated age. Electronically Signed   By: Elsie Stain M.D.   On: 02/27/2016  18:28    Medical Consultants:    Neurology  Anti-Infectives:   Anti-infectives    None      Subjective:   Adonis BrookSandra B Mcclenny report resolution of left-sided facial and arm paresthesias. No headache. No dyspnea. No chest pain. Appetite is at usual baseline.  Objective:    Filed Vitals:   02/27/16 2130 02/27/16 2330 02/28/16 0126 02/28/16 0542  BP: 145/92 147/68 138/54 149/67  Pulse: 68 75 71 81  Temp: 98.1 F (36.7 C) 97.9 F (36.6 C) 97.8 F (36.6 C) 97.8 F (36.6 C)  TempSrc: Oral Oral Oral Oral  Resp: 18 16 18 20   Height:      Weight:      SpO2: 100% 96% 99% 98%    Intake/Output Summary (Last 24 hours) at 02/28/16 0826 Last data filed at 02/27/16 1900  Gross per 24 hour  Intake    480 ml  Output      0 ml  Net    480 ml   Filed Weights   02/26/16 2359  Weight: 72.576 kg (160 lb)    Exam: Gen:  NAD Cardiovascular:  RRR, No M/R/G Respiratory:  Lungs CTAB Gastrointestinal:  Abdomen soft, NT/ND, + BS Extremities:  No C/E/C Neuro: No focal motor deficits. No pronator drift. Cranial nerves II through XII grossly intact.   Data Reviewed:    Labs: Basic Metabolic Panel:  Recent Labs Lab 02/27/16 0150 02/27/16 0159 02/28/16 0503  NA 128* 130* 133*  K 3.8 3.9 3.9  CL 95* 94* 100*  CO2 23  --  24  GLUCOSE 110* 110* 99  BUN 8 10 8   CREATININE 1.05* 1.00 1.00  CALCIUM 9.2  --  9.8   GFR Estimated  Creatinine Clearance: 37.9 mL/min (by C-G formula based on Cr of 1). Liver Function Tests:  Recent Labs Lab 02/27/16 0150  AST 17  ALT 13*  ALKPHOS 52  BILITOT 1.0  PROT 7.5  ALBUMIN 3.8   Coagulation profile  Recent Labs Lab 02/27/16 0150  INR 0.99    CBC:  Recent Labs Lab 02/27/16 0150 02/27/16 0159  WBC 8.8  --   NEUTROABS 5.8  --   HGB 13.1 15.0  HCT 40.5 44.0  MCV 101.0*  --   PLT 265  --    Hgb A1c: No results for input(s): HGBA1C in the last 72 hours. Lipid Profile:  Recent Labs  02/28/16 0503  CHOL 148  HDL 47  LDLCALC 68  TRIG 163*  CHOLHDL 3.1   Microbiology No results found for this or any previous visit (from the past 240 hour(s)).   Medications:   . aspirin  300 mg Rectal Daily   Or  . aspirin  325 mg Oral Daily  . carvedilol  6.25 mg Oral BID WC  . enoxaparin (LOVENOX) injection  40 mg Subcutaneous Daily  . famotidine  20 mg Oral BID  . lisinopril  20 mg Oral Daily  . pravastatin  20 mg Oral q1800   Continuous Infusions:   Time spent: 25 minutes.     Cianni Manny  [facility] Pager [phone]. If unable to reach me by pager, please call my cell phone at [phone].  *Please refer to [email or web address], password [email or web address] to get updated schedule on who will round on this patient, as hospitalists switch teams weekly. If 7PM-7AM, please contact night-coverage at [email or web address], password [email or web address] for any overnight needs.  02/28/2016, 8:26 AM

## 2016-02-28 NOTE — Progress Notes (Signed)
VASCULAR LAB PRELIMINARY  PRELIMINARY  PRELIMINARY  PRELIMINARY  Carotid duplex completed.    Preliminary report:  1-39% ICA plaquing.  Vertebral artery flow is antegrade.   Jill Hernandez, RVT 02/28/2016, 11:42 AM

## 2016-02-28 NOTE — Care Management Obs Status (Signed)
MEDICARE OBSERVATION STATUS NOTIFICATION   Patient Details  Name: Jill Hernandez MRN: [id number] Date of Birth: 02/19/1930   Medicare Observation Status Notification Given:  Yes (MRI results negative)    Jill BaloKelli F Mardie Kellen, RN 02/28/2016, 11:41 AM

## 2016-02-28 NOTE — Discharge Summary (Signed)
Physician Discharge Summary  Jill Hernandez MVH:[id number] DOB: Jun 28, 1930 DOA: 02/26/2016  PCP: Ethel RanaHEPLER,MARK, PA-C  Admit date: 02/26/2016 Discharge date: 02/28/2016   Recommendations for Outpatient Follow-Up:   1. F/U with PCP in 3 weeks for hospital follow up. Please F/U hemoglobin A1c which was pending at D/C.   Discharge Diagnosis:   Principal Problem:    TIA (transient ischemic attack) Active Problems:    HTN (hypertension)    HLD (hyperlipidemia)    Hyponatremia    Small vessel disease, cerebrovascular    Cerebral atrophy   Discharge disposition:  ALF  Discharge Condition: Improved.  Diet recommendation: Low sodium, heart healthy.    History of Present Illness:   Jill Hernandez is an 82 y.o. female the PMH of hypertension and hyperlipidemia who was admitted 02/26/16 with TIA manifested by left sided body and facial paresthesias. EMS was called and found her blood pressure to be 215/87. CT done on admission was negative for acute findings. Neurology was subsequently consulted.  Hospital Course by Problem:   Principal Problem:  TIA (transient ischemic attack)/cerebrovascular small vessel disease/cerebral atrophy Full stroke workup initiated. Neurologist evaluated. MRI negative for acute infarction. There was global atrophy and mild to moderate small vessel disease. 2-D echo and carotid Dopplers negative for source of emboli. Follow-up hemoglobin A1c. LDL 68, controlled. PT/OT and speech therapy consulted. Continue aspirin.  Active Problems:  HTN (hypertension) Continue carvedilol and lisinopril.    HLD (hyperlipidemia) Continue statin therapy. LDL goal less than 70, at goal.    Hyponatremia Mild, improving. Urine osmolality low. Urine sodium less than 10.   Medical Consultants:    Neurology   Discharge Exam:   Filed Vitals:   02/28/16 1052 02/28/16 1408  BP: 113/50 137/61  Pulse: 68 75  Temp: 98 F (36.7 C) 98.2 F (36.8 C)  Resp: 18 17     Filed Vitals:   02/28/16 0126 02/28/16 0542 02/28/16 1052 02/28/16 1408  BP: 138/54 149/67 113/50 137/61  Pulse: 71 81 68 75  Temp: 97.8 F (36.6 C) 97.8 F (36.6 C) 98 F (36.7 C) 98.2 F (36.8 C)  TempSrc: Oral Oral Oral Oral  Resp: 18 20 18 17   Height:      Weight:      SpO2: 99% 98% 98% 98%    Gen:  NAD Cardiovascular:  RRR, No M/R/G Respiratory: Lungs CTAB Gastrointestinal: Abdomen soft, NT/ND with normal active bowel sounds. Extremities: No C/E/C Neuro: Non-focal   The results of significant diagnostics from this hospitalization (including imaging, microbiology, ancillary and laboratory) are listed below for reference.     Procedures and Diagnostic Studies:   Ct Head Wo Contrast  02/27/2016  CLINICAL DATA:  Acute onset of left-sided numbness and tingling. Initial encounter. EXAM: CT HEAD WITHOUT CONTRAST TECHNIQUE: Contiguous axial images were obtained from the base of the skull through the vertex without intravenous contrast. COMPARISON:  CT of the head and MRI of the brain performed 04/29/2014 FINDINGS: There is no evidence of acute infarction, mass lesion, or intra- or extra-axial hemorrhage on CT. Prominence of the ventricles and sulci reflects mild cortical volume loss. The brainstem and fourth ventricle are within normal limits. The basal ganglia are unremarkable in appearance. The cerebral hemispheres demonstrate grossly normal gray-white differentiation. No mass effect or midline shift is seen. There is no evidence of fracture; visualized osseous structures are unremarkable in appearance. The visualized portions of the orbits are within normal limits. The paranasal sinuses and mastoid air cells are  well-aerated. No significant soft tissue abnormalities are seen. IMPRESSION: 1. No acute intracranial pathology seen on CT. 2. Mild cortical volume loss noted. Electronically Signed   By: Roanna Raider M.D.   On: 02/27/2016 01:55   Mr Brain Wo Contrast  02/27/2016   CLINICAL DATA:  Patient with hypertension and hyperlipidemia was admitted 02/26/16 with a TIA manifest by LEFT-sided body and facial paresthesias. EXAM: MRI HEAD WITHOUT CONTRAST MRA HEAD WITHOUT CONTRAST TECHNIQUE: Multiplanar, multiecho pulse sequences of the brain and surrounding structures were obtained without intravenous contrast. Angiographic images of the head were obtained using MRA technique without contrast. COMPARISON:  CT head 02/27/2016.  MR head 04/29/2014. FINDINGS: MRI HEAD FINDINGS The patient was unable to remain motionless for the exam. Small or subtle lesions could be overlooked. No evidence for acute infarction, hemorrhage, mass lesion, hydrocephalus, or extra-axial fluid. Mild cerebral and cerebellar atrophy. Mild to moderate subcortical and periventricular T2 and FLAIR hyperintensities, likely chronic microvascular ischemic change. Ischemic demyelination is noted in the mid pons bilaterally. Normal pituitary. Cerebellar tonsillar ectopia. Moderate pannus surrounds the odontoid. Moderate to severe stenosis at C2-3 related to central protrusion and posterior element hypertrophy incompletely evaluated. Mild anterolisthesis C3-4 incompletely evaluated. Severe disc space narrowing C4-5 with suspected stenosis, incompletely evaluated. Consider cervical spine MRI for further evaluation. Flow voids are maintained. Tiny focus chronic hemorrhage RIGHT cerebellar tonsil, could represent sequelae of hypertensive cerebrovascular disease. Extracranial soft tissues unremarkable. Compared with 2015, small vessel disease has progressed, notably in the mid pons. MRA HEAD FINDINGS Anterior circulation: Widely patent cervical, and petrous ICA segments. BILATERAL 50-75% stenosis of the internal carotid arteries at the junction of the cavernous and supraclinoid ICAs. ICA termini widely patent. No A1 or M1 stenosis. No MCA branch occlusion. Posterior circulation: Vertebral arteries are widely patent and contribute  equally to the formation of basilar. No cerebral branch stenosis or occlusion. Fetal origin LEFT PCA with rudimentary LEFT P1 connector. No intracranial aneurysm. IMPRESSION: Global atrophy. Mild to moderate small vessel disease. No acute intracranial findings. Multiple potentially compressive abnormalities related to degenerative disc disease in the cervical spine, including pannus surrounding the odontoid. If there are signs and symptoms of radiculopathy or myelopathy, consider cervical spine MRI for further evaluation. BILATERAL 50-75% stenoses of the cervical internal carotid arteries, at the junction of the cavernous/supraclinoid ICAs. These are not necessarily flow reducing nor are they an unusual finding given the patient's stated age. Electronically Signed   By: Elsie Stain M.D.   On: 02/27/2016 18:28   Mr Maxine Glenn Head/brain Wo Cm  02/27/2016  CLINICAL DATA:  Patient with hypertension and hyperlipidemia was admitted 02/26/16 with a TIA manifest by LEFT-sided body and facial paresthesias. EXAM: MRI HEAD WITHOUT CONTRAST MRA HEAD WITHOUT CONTRAST TECHNIQUE: Multiplanar, multiecho pulse sequences of the brain and surrounding structures were obtained without intravenous contrast. Angiographic images of the head were obtained using MRA technique without contrast. COMPARISON:  CT head 02/27/2016.  MR head 04/29/2014. FINDINGS: MRI HEAD FINDINGS The patient was unable to remain motionless for the exam. Small or subtle lesions could be overlooked. No evidence for acute infarction, hemorrhage, mass lesion, hydrocephalus, or extra-axial fluid. Mild cerebral and cerebellar atrophy. Mild to moderate subcortical and periventricular T2 and FLAIR hyperintensities, likely chronic microvascular ischemic change. Ischemic demyelination is noted in the mid pons bilaterally. Normal pituitary. Cerebellar tonsillar ectopia. Moderate pannus surrounds the odontoid. Moderate to severe stenosis at C2-3 related to central protrusion and  posterior element hypertrophy incompletely evaluated. Mild anterolisthesis C3-4 incompletely  evaluated. Severe disc space narrowing C4-5 with suspected stenosis, incompletely evaluated. Consider cervical spine MRI for further evaluation. Flow voids are maintained. Tiny focus chronic hemorrhage RIGHT cerebellar tonsil, could represent sequelae of hypertensive cerebrovascular disease. Extracranial soft tissues unremarkable. Compared with 2015, small vessel disease has progressed, notably in the mid pons. MRA HEAD FINDINGS Anterior circulation: Widely patent cervical, and petrous ICA segments. BILATERAL 50-75% stenosis of the internal carotid arteries at the junction of the cavernous and supraclinoid ICAs. ICA termini widely patent. No A1 or M1 stenosis. No MCA branch occlusion. Posterior circulation: Vertebral arteries are widely patent and contribute equally to the formation of basilar. No cerebral branch stenosis or occlusion. Fetal origin LEFT PCA with rudimentary LEFT P1 connector. No intracranial aneurysm. IMPRESSION: Global atrophy. Mild to moderate small vessel disease. No acute intracranial findings. Multiple potentially compressive abnormalities related to degenerative disc disease in the cervical spine, including pannus surrounding the odontoid. If there are signs and symptoms of radiculopathy or myelopathy, consider cervical spine MRI for further evaluation. BILATERAL 50-75% stenoses of the cervical internal carotid arteries, at the junction of the cavernous/supraclinoid ICAs. These are not necessarily flow reducing nor are they an unusual finding given the patient's stated age. Electronically Signed   By: Elsie Stain M.D.   On: 02/27/2016 18:28     Labs:   Basic Metabolic Panel:  Recent Labs Lab 02/27/16 0150 02/27/16 0159 02/28/16 0503  NA 128* 130* 133*  K 3.8 3.9 3.9  CL 95* 94* 100*  CO2 23  --  24  GLUCOSE 110* 110* 99  BUN 8 10 8   CREATININE 1.05* 1.00 1.00  CALCIUM 9.2  --  9.8    GFR Estimated Creatinine Clearance: 37.9 mL/min (by C-G formula based on Cr of 1). Liver Function Tests:  Recent Labs Lab 02/27/16 0150  AST 17  ALT 13*  ALKPHOS 52  BILITOT 1.0  PROT 7.5  ALBUMIN 3.8   Coagulation profile  Recent Labs Lab 02/27/16 0150  INR 0.99    CBC:  Recent Labs Lab 02/27/16 0150 02/27/16 0159  WBC 8.8  --   NEUTROABS 5.8  --   HGB 13.1 15.0  HCT 40.5 44.0  MCV 101.0*  --   PLT 265  --    Lipid Profile  Recent Labs  02/28/16 0503  CHOL 148  HDL 47  LDLCALC 68  TRIG 163*  CHOLHDL 3.1     Discharge Instructions:   Discharge Instructions    Call MD for:  difficulty breathing, headache or visual disturbances    Complete by:  As directed      Call MD for:  extreme fatigue    Complete by:  As directed      Call MD for:    Complete by:  As directed   Numbness or weakness     Diet - low sodium heart healthy    Complete by:  As directed      Increase activity slowly    Complete by:  As directed             Medication List    TAKE these medications        acetaminophen 500 MG tablet  Commonly known as:  TYLENOL  Take 1,000 mg by mouth 2 (two) times daily.     aspirin EC 81 MG tablet  Take 1 tablet (81 mg total) by mouth daily.     carvedilol 6.25 MG tablet  Commonly known as:  COREG  Take  6.25 mg by mouth 2 (two) times daily with a meal.     lisinopril 20 MG tablet  Commonly known as:  PRINIVIL,ZESTRIL  Take 20 mg by mouth daily with lunch.     lovastatin 20 MG tablet  Commonly known as:  MEVACOR  Take 20 mg by mouth daily with supper.     ranitidine 150 MG tablet  Commonly known as:  ZANTAC  Take 150 mg by mouth 2 (two) times daily before a meal.     trolamine salicylate 10 % cream  Commonly known as:  ASPERCREME  Apply 1 application topically 2 (two) times daily as needed for muscle pain (leg cramps/swelling). Apply's to knee's     TUMS E-X PO  Take 2 tablets by mouth daily as needed (acid reflux).            Follow-up Information    Follow up with HEPLER,MARK, PA-C. Schedule an appointment as soon as possible for a visit in 3 weeks.   Specialty:  Physician Assistant   Why:  Hospital follow up.   Contact information:   [address] [place] [address] [phone]        Time coordinating discharge: 30 minutes.  Signed:  RAMA,CHRISTINA  Pager [phone] [facility] 02/28/2016, 4:45 PM

## 2016-02-28 NOTE — Progress Notes (Signed)
discharge orders received, Pt for discharge home today. IV d/c'd. D/c instructions and RX given with verbalized understanding. Family at bedside to assist patient with discharge. Staff bought pt downstairs via wheelchair. 02/28/16 1823

## 2016-02-28 NOTE — Progress Notes (Signed)
  Echocardiogram 2D Echocardiogram has been performed.  Jill Hernandez 02/28/2016, 10:19 AM

## 2016-02-28 NOTE — Discharge Instructions (Signed)
Stroke Prevention Some medical conditions and behaviors are associated with an increased chance of having a stroke. You may prevent a stroke by making healthy choices and managing medical conditions. HOW CAN I REDUCE MY RISK OF HAVING A STROKE?   Stay physically active. Get at least 30 minutes of activity on most or all days.  Do not smoke. It may also be helpful to avoid exposure to secondhand smoke.  Limit alcohol use. Moderate alcohol use is considered to be:  No more than 2 drinks per day for men.  No more than 1 drink per day for nonpregnant women.  Eat healthy foods. This involves:  Eating 5 or more servings of fruits and vegetables a day.  Making dietary changes that address high blood pressure (hypertension), high cholesterol, diabetes, or obesity.  Manage your cholesterol levels.  Making food choices that are high in fiber and low in saturated fat, trans fat, and cholesterol may control cholesterol levels.  Take any prescribed medicines to control cholesterol as directed by your health care provider.  Manage your diabetes.  Controlling your carbohydrate and sugar intake is recommended to manage diabetes.  Take any prescribed medicines to control diabetes as directed by your health care provider.  Control your hypertension.  Making food choices that are low in salt (sodium), saturated fat, trans fat, and cholesterol is recommended to manage hypertension.  Ask your health care provider if you need treatment to lower your blood pressure. Take any prescribed medicines to control hypertension as directed by your health care provider.  If you are 18-39 years of age, have your blood pressure checked every 3-5 years. If you are 40 years of age or older, have your blood pressure checked every year.  Maintain a healthy weight.  Reducing calorie intake and making food choices that are low in sodium, saturated fat, trans fat, and cholesterol are recommended to manage  weight.  Stop drug abuse.  Avoid taking birth control pills.  Talk to your health care provider about the risks of taking birth control pills if you are over 35 years old, smoke, get migraines, or have ever had a blood clot.  Get evaluated for sleep disorders (sleep apnea).  Talk to your health care provider about getting a sleep evaluation if you snore a lot or have excessive sleepiness.  Take medicines only as directed by your health care provider.  For some people, aspirin or blood thinners (anticoagulants) are helpful in reducing the risk of forming abnormal blood clots that can lead to stroke. If you have the irregular heart rhythm of atrial fibrillation, you should be on a blood thinner unless there is a good reason you cannot take them.  Understand all your medicine instructions.  Make sure that other conditions (such as anemia or atherosclerosis) are addressed. SEEK IMMEDIATE MEDICAL CARE IF:   You have sudden weakness or numbness of the face, arm, or leg, especially on one side of the body.  Your face or eyelid droops to one side.  You have sudden confusion.  You have trouble speaking (aphasia) or understanding.  You have sudden trouble seeing in one or both eyes.  You have sudden trouble walking.  You have dizziness.  You have a loss of balance or coordination.  You have a sudden, severe headache with no known cause.  You have new chest pain or an irregular heartbeat. Any of these symptoms may represent a serious problem that is an emergency. Do not wait to see if the symptoms will   go away. Get medical help at once. Call your local emergency services (911 in [place] Do not drive yourself to the hospital.   This information is not intended to replace advice given to you by your health care provider. Make sure you discuss any questions you have with your health care provider.   Document Released: 12/21/2004 Document Revised: 12/04/2014 Document Reviewed:  05/16/2013 Elsevier Interactive Patient Education 2016 [employer] Inc[employer]  

## 2016-02-28 NOTE — Progress Notes (Addendum)
STROKE TEAM PROGRESS NOTE   HISTORY OF PRESENT ILLNESS Jill Hernandez is a 82 y.o. female with a history of hypertension and hyperlipidemia who presents with transient left-sided numbness involving the face and arm. She states that around 9:00, she admits that her left face and arm felt funny and tingling. She subsequently called for someone at the nursing home and took her blood pressure and found it to be very elevated. EMS was called and she was transported for further evaluation. Her symptoms began improving and transport and she was essentially back to baseline by around 11 PM   LKW: 9 PM tpa given?: no, rapidly improving mild symptoms   SUBJECTIVE (INTERVAL HISTORY) She notes improvement in numbness and tingling and burning in the left arm and left side of the face. Daughter at bedside. MRI scan of the brain shows no acute infarct.  OBJECTIVE Temp:  [97.8 F (36.6 C)-98.2 F (36.8 C)] 98.2 F (36.8 C) (04/03 1408) Pulse Rate:  [63-81] 75 (04/03 1408) Cardiac Rhythm:  [-] Normal sinus rhythm (04/03 0706) Resp:  [16-20] 17 (04/03 1408) BP: (113-149)/(50-92) 137/61 mmHg (04/03 1408) SpO2:  [96 %-100 %] 98 % (04/03 1408)  CBC:   Recent Labs Lab 02/27/16 0150 02/27/16 0159  WBC 8.8  --   NEUTROABS 5.8  --   HGB 13.1 15.0  HCT 40.5 44.0  MCV 101.0*  --   PLT 265  --     Basic Metabolic Panel:   Recent Labs Lab 02/27/16 0150 02/27/16 0159 02/28/16 0503  NA 128* 130* 133*  K 3.8 3.9 3.9  CL 95* 94* 100*  CO2 23  --  24  GLUCOSE 110* 110* 99  BUN CREATININE 1.05* 1.00 1.00  CALCIUM 9.2  --  9.8    Lipid Panel:     Component Value Date/Time   CHOL 148 02/28/2016 0503   TRIG 163* 02/28/2016 0503   HDL 47 02/28/2016 0503   CHOLHDL 3.1 02/28/2016 0503   VLDL 33 02/28/2016 0503   LDLCALC 68 02/28/2016 0503   HgbA1c: No results found for: HGBA1C Urine Drug Screen:     Component Value Date/Time   LABOPIA NONE DETECTED 02/27/2016 0250   COCAINSCRNUR NONE DETECTED 02/27/2016 0250   LABBENZ NONE DETECTED 02/27/2016 0250   AMPHETMU NONE DETECTED 02/27/2016 0250   THCU NONE DETECTED 02/27/2016 0250   LABBARB NONE DETECTED 02/27/2016 0250      IMAGING  Ct Head Wo Contrast 02/27/2016   1. No acute intracranial pathology seen on CT.  2. Mild cortical volume loss noted.     MRI/MRA Head - No acute infarct. Mild small vessel disease changes. Probable compressive degenerative cervical spine disease. MRA shows 50-75 percent stenosis of cervical internal carotid arteries and the cavernous/supraclinoid junction.      PHYSICAL EXAM  Physical exam: Exam: Gen: NAD Eyes: anicteric sclerae, moist conjunctivae                    CV: no MRG, no carotid bruits, no peripheral edema Mental Status: Alert, follows commands, oriented to person, place, month, year, and situation, good historian  Neuro: Detailed Neurologic Exam  Speech:    No aphasia, no dysarthria  Cranial Nerves:    The pupils are equal, round, and reactive to light.. Attempted, Fundi not visualized.  EOMI. No gaze preference. Visual fields full. Face symmetric on smile,Dec sensation left face.  Tongue midline. Hearing intact to voice. Shoulder shrug intact  Motor  Observation:    no involuntary movements noted. Tone appears normal.     Strength:    UE 5/5, difficult to test lowers due to arthritis but can lift legs off the bed without drift      Sensation:  Decreased on left arm and left face  Plantars downgoing.   Reflexes: AJs absent, Defered patellars due to arthritis. Biceps 1+.     ASSESSMENT/PLAN Ms. Jill Hernandez is a 82 y.o. female with history of hypertension and hyperlipidemia presenting with left-sided numbness and elevated blood pressure. She did not receive IV t-PA due to resolution of deficits.  Possible TIA:  Non-dominant   Resultant  deficits resolved  MRI  No acute infarct  MRA 50-75% terminal carotid stenosis  Carotid  Doppler 1-39% ICA plaquing. Vertebral artery flow is antegrade 2D Echo  Left ventricle: The cavity size was normal. Wall thickness was  increased in a pattern of mild LVH. Systolic function was normal.  The estimated ejection fraction was in the range of 60% to 65%.  Wall motion was normal; there were no regional wall motion   abnormalities  LDL 68  HgbA1c pending   VTE prophylaxis - Lovenox Diet Heart Room service appropriate?: Yes; Fluid consistency:: Thin  aspirin 81 mg daily prior to admission, now on aspirin 325 mg daily  Patient counseled to be compliant with her antithrombotic medications  Ongoing aggressive stroke risk factor management  Therapy recommendations: pending  Disposition:  Pending  Hypertension  Stable     Hyperlipidemia  Home meds:  Mevacor 20 mg daily resumed in hospital  LDL pending, goal < 70    Other Stroke Risk Factors  Advanced age  Cigarette smoker, quit smoking 52 years ago.  Obesity, Body mass index is 31.25 kg/(m^2).      Other Active Problems  Hyponatremia  Hospital day #    Personally examined patient and images, and have participated in and made any corrections needed to history, physical, neuro exam,assessment and plan as stated above.  I have personally obtained the history, evaluated lab date, reviewed imaging studies and agree with radiology interpretations.  Check transthoracic echo results and hemoglobin A1c. Continue aspirin 325 mg daily. Patient can be discharged after echo results. Greater than 50% time during this 25 minute visit was spent on counseling and coordination of care about stroke risk, prevention and treatment  Delia HeadyPramod Sethi, MD Stroke Neurology       To contact Stroke Continuity provider, please refer to [email or web address]. After hours, contact General Neurology

## 2016-02-29 ENCOUNTER — Encounter (HOSPITAL_COMMUNITY): Payer: Self-pay | Admitting: *Deleted

## 2016-02-29 ENCOUNTER — Emergency Department (HOSPITAL_COMMUNITY)
Admission: EM | Admit: 2016-02-29 | Discharge: 2016-02-29 | Disposition: A | Discharge status: Home / Self Care | Payer: Medicare Other | Attending: Emergency Medicine | Admitting: Emergency Medicine

## 2016-02-29 DIAGNOSIS — Z87891 Personal history of nicotine dependence: Secondary | ICD-10-CM | POA: Diagnosis not present

## 2016-02-29 DIAGNOSIS — R202 Paresthesia of skin: Secondary | ICD-10-CM | POA: Diagnosis not present

## 2016-02-29 DIAGNOSIS — K219 Gastro-esophageal reflux disease without esophagitis: Secondary | ICD-10-CM | POA: Diagnosis not present

## 2016-02-29 DIAGNOSIS — I10 Essential (primary) hypertension: Secondary | ICD-10-CM | POA: Diagnosis not present

## 2016-02-29 DIAGNOSIS — H538 Other visual disturbances: Secondary | ICD-10-CM | POA: Diagnosis present

## 2016-02-29 DIAGNOSIS — G459 Transient cerebral ischemic attack, unspecified: Secondary | ICD-10-CM | POA: Insufficient documentation

## 2016-02-29 DIAGNOSIS — R200 Anesthesia of skin: Secondary | ICD-10-CM | POA: Diagnosis not present

## 2016-02-29 DIAGNOSIS — F419 Anxiety disorder, unspecified: Secondary | ICD-10-CM | POA: Insufficient documentation

## 2016-02-29 DIAGNOSIS — Z7982 Long term (current) use of aspirin: Secondary | ICD-10-CM | POA: Insufficient documentation

## 2016-02-29 DIAGNOSIS — R29810 Facial weakness: Secondary | ICD-10-CM | POA: Diagnosis not present

## 2016-02-29 DIAGNOSIS — E785 Hyperlipidemia, unspecified: Secondary | ICD-10-CM | POA: Insufficient documentation

## 2016-02-29 DIAGNOSIS — I6789 Other cerebrovascular disease: Secondary | ICD-10-CM | POA: Diagnosis not present

## 2016-02-29 DIAGNOSIS — Z79899 Other long term (current) drug therapy: Secondary | ICD-10-CM | POA: Diagnosis not present

## 2016-02-29 DIAGNOSIS — R42 Dizziness and giddiness: Secondary | ICD-10-CM | POA: Diagnosis not present

## 2016-02-29 LAB — HEMOGLOBIN A1C
HEMOGLOBIN A1C: 5.8 % — AB (ref 4.8–5.6)
Mean Plasma Glucose: 120 mg/dL

## 2016-02-29 MED ORDER — ASPIRIN EC 325 MG PO TBEC: 325.0000 mg | DELAYED_RELEASE_TABLET | Freq: Every day | ORAL | Status: DC

## 2016-02-29 NOTE — ED Notes (Signed)
Pt is in stable condition upon d/c and is escorted from ED via wheelchair. 

## 2016-02-29 NOTE — ED Provider Notes (Signed)
CSN: [id number]     Arrival date & time 02/29/16  1230 History   First MD Initiated Contact with Patient 02/29/16 1252     Chief Complaint  Patient presents with  . Blurred Vision     (Consider location/radiation/quality/duration/timing/severity/associated sxs/prior Treatment) HPI Jill Hernandez is a 82 y.o. female with hx of htn, hyperlipidimia, presents to ED with complaint of left arm tingling and blurred vision which has now resolved. Patient states she was admitted 3 days ago and diagnosed with TIA. At that time she had left-sided numbness and heaviness into her left arm. Patient had full workup while in the hospital, including no acute findings on the MRI MRA of the brain and CT scan. She was discharged home in stable condition yesterday. Patient states the symptoms that she was having started this morning. She states they "made me anxious and I went to my primary care doctor." There she states that she felt better but was sent here for further evaluation. Patient is also concerned about her blood pressure has been elevated since 3 days ago as well. Upon arrival to emergency department patient's blood pressure was 215/87 3 days ago. She states since then her blood pressure was in 140 to 160s systolic. She states this is unusually high for her. She reports being compliant with her blood pressure medications. At this time patient has no complaints.  Past Medical History  Diagnosis Date  . Hypertension   . Hyperlipemia   . GERD (gastroesophageal reflux disease)    Past Surgical History  Procedure Laterality Date  . Abdominal hysterectomy    . Tonsillectomy     Family History  Problem Relation Age of Onset  . Hypertension Mother   . Hyperlipidemia Father   . Hypertension Father    Social History  Substance Use Topics  . Smoking status: Former Smoker -- 20 years    Types: Cigarettes    Quit date: 04/30/1963  . Smokeless tobacco: Never Used  . Alcohol Use: No   OB History     No data available     Review of Systems  Constitutional: Negative for fever and chills.  Respiratory: Negative for cough, chest tightness and shortness of breath.   Cardiovascular: Negative for chest pain, palpitations and leg swelling.  Gastrointestinal: Negative for nausea, vomiting, abdominal pain and diarrhea.  Musculoskeletal: Negative for myalgias, arthralgias, neck pain and neck stiffness.  Skin: Negative for rash.  Neurological: Positive for dizziness and numbness. Negative for weakness and headaches.  All other systems reviewed and are negative.     Allergies  Sulfa antibiotics  Home Medications   Prior to Admission medications   Medication Sig Start Date End Date Taking? Authorizing Provider  acetaminophen (TYLENOL) 500 MG tablet Take 1,000 mg by mouth 2 (two) times daily.   Yes Historical Provider, MD  aspirin EC 81 MG tablet Take 1 tablet (81 mg total) by mouth daily. Patient taking differently: Take 81 mg by mouth daily with lunch.  05/01/14  Yes Leroy Sea, MD  Calcium Carbonate Antacid (TUMS E-X PO) Take 2 tablets by mouth daily as needed (acid reflux).   Yes Historical Provider, MD  carvedilol (COREG) 6.25 MG tablet Take 6.25 mg by mouth 2 (two) times daily with a meal.   Yes Historical Provider, MD  lisinopril (PRINIVIL,ZESTRIL) 20 MG tablet Take 20 mg by mouth daily with lunch.    Yes Historical Provider, MD  lovastatin (MEVACOR) 20 MG tablet Take 20 mg by mouth daily with  supper.    Yes Historical Provider, MD  ranitidine (ZANTAC) 150 MG tablet Take 150 mg by mouth 2 (two) times daily before a meal.    Yes Historical Provider, MD  trolamine salicylate (ASPERCREME) 10 % cream Apply 1 application topically 2 (two) times daily as needed for muscle pain (leg cramps/swelling). Apply's to knee's   Yes Historical Provider, MD   BP 150/66 mmHg  Pulse 68  Temp(Src) 98 F (36.7 C)  Resp 15  Ht 5\' 2"  (1.575 m)  Wt 72.576 kg  BMI 29.26 kg/m2  SpO2 96% Physical  Exam  Constitutional: She is oriented to person, place, and time. She appears well-developed and well-nourished. No distress.  HENT:  Head: Normocephalic.  Eyes: Conjunctivae and EOM are normal. Pupils are equal, round, and reactive to light.  Neck: Neck supple.  Cardiovascular: Normal rate, regular rhythm and normal heart sounds.   Pulmonary/Chest: Effort normal and breath sounds normal. No respiratory distress. She has no wheezes. She has no rales.  Abdominal: Soft. Bowel sounds are normal. She exhibits no distension. There is no tenderness. There is no rebound.  Musculoskeletal: She exhibits no edema.  Neurological: She is alert and oriented to person, place, and time. No cranial nerve deficit. Coordination normal.  5/5 and equal upper and lower extremity strength bilaterally. Equal grip strength bilaterally. Sensation is intact and equal in both upper and lower extremities. Normal finger to nose and heel to shin. No pronator drift. Patellar reflexes 2+   Skin: Skin is warm and dry.  Psychiatric: She has a normal mood and affect. Her behavior is normal.  Nursing note and vitals reviewed.   ED Course  Procedures (including critical care time) Labs Review Labs Reviewed - No data to display  Imaging Review Mr Brain Wo Contrast  02/27/2016  CLINICAL DATA:  Patient with hypertension and hyperlipidemia was admitted 02/26/16 with a TIA manifest by LEFT-sided body and facial paresthesias. EXAM: MRI HEAD WITHOUT CONTRAST MRA HEAD WITHOUT CONTRAST TECHNIQUE: Multiplanar, multiecho pulse sequences of the brain and surrounding structures were obtained without intravenous contrast. Angiographic images of the head were obtained using MRA technique without contrast. COMPARISON:  CT head 02/27/2016.  MR head 04/29/2014. FINDINGS: MRI HEAD FINDINGS The patient was unable to remain motionless for the exam. Small or subtle lesions could be overlooked. No evidence for acute infarction, hemorrhage, mass lesion,  hydrocephalus, or extra-axial fluid. Mild cerebral and cerebellar atrophy. Mild to moderate subcortical and periventricular T2 and FLAIR hyperintensities, likely chronic microvascular ischemic change. Ischemic demyelination is noted in the mid pons bilaterally. Normal pituitary. Cerebellar tonsillar ectopia. Moderate pannus surrounds the odontoid. Moderate to severe stenosis at C2-3 related to central protrusion and posterior element hypertrophy incompletely evaluated. Mild anterolisthesis C3-4 incompletely evaluated. Severe disc space narrowing C4-5 with suspected stenosis, incompletely evaluated. Consider cervical spine MRI for further evaluation. Flow voids are maintained. Tiny focus chronic hemorrhage RIGHT cerebellar tonsil, could represent sequelae of hypertensive cerebrovascular disease. Extracranial soft tissues unremarkable. Compared with 2015, small vessel disease has progressed, notably in the mid pons. MRA HEAD FINDINGS Anterior circulation: Widely patent cervical, and petrous ICA segments. BILATERAL 50-75% stenosis of the internal carotid arteries at the junction of the cavernous and supraclinoid ICAs. ICA termini widely patent. No A1 or M1 stenosis. No MCA branch occlusion. Posterior circulation: Vertebral arteries are widely patent and contribute equally to the formation of basilar. No cerebral branch stenosis or occlusion. Fetal origin LEFT PCA with rudimentary LEFT P1 connector. No intracranial aneurysm. IMPRESSION: Global  atrophy. Mild to moderate small vessel disease. No acute intracranial findings. Multiple potentially compressive abnormalities related to degenerative disc disease in the cervical spine, including pannus surrounding the odontoid. If there are signs and symptoms of radiculopathy or myelopathy, consider cervical spine MRI for further evaluation. BILATERAL 50-75% stenoses of the cervical internal carotid arteries, at the junction of the cavernous/supraclinoid ICAs. These are not  necessarily flow reducing nor are they an unusual finding given the patient's stated age. Electronically Signed   By: Elsie Stain M.D.   On: 02/27/2016 18:28   Mr Maxine Glenn Head/brain Wo Cm  02/27/2016  CLINICAL DATA:  Patient with hypertension and hyperlipidemia was admitted 02/26/16 with a TIA manifest by LEFT-sided body and facial paresthesias. EXAM: MRI HEAD WITHOUT CONTRAST MRA HEAD WITHOUT CONTRAST TECHNIQUE: Multiplanar, multiecho pulse sequences of the brain and surrounding structures were obtained without intravenous contrast. Angiographic images of the head were obtained using MRA technique without contrast. COMPARISON:  CT head 02/27/2016.  MR head 04/29/2014. FINDINGS: MRI HEAD FINDINGS The patient was unable to remain motionless for the exam. Small or subtle lesions could be overlooked. No evidence for acute infarction, hemorrhage, mass lesion, hydrocephalus, or extra-axial fluid. Mild cerebral and cerebellar atrophy. Mild to moderate subcortical and periventricular T2 and FLAIR hyperintensities, likely chronic microvascular ischemic change. Ischemic demyelination is noted in the mid pons bilaterally. Normal pituitary. Cerebellar tonsillar ectopia. Moderate pannus surrounds the odontoid. Moderate to severe stenosis at C2-3 related to central protrusion and posterior element hypertrophy incompletely evaluated. Mild anterolisthesis C3-4 incompletely evaluated. Severe disc space narrowing C4-5 with suspected stenosis, incompletely evaluated. Consider cervical spine MRI for further evaluation. Flow voids are maintained. Tiny focus chronic hemorrhage RIGHT cerebellar tonsil, could represent sequelae of hypertensive cerebrovascular disease. Extracranial soft tissues unremarkable. Compared with 2015, small vessel disease has progressed, notably in the mid pons. MRA HEAD FINDINGS Anterior circulation: Widely patent cervical, and petrous ICA segments. BILATERAL 50-75% stenosis of the internal carotid arteries at the  junction of the cavernous and supraclinoid ICAs. ICA termini widely patent. No A1 or M1 stenosis. No MCA branch occlusion. Posterior circulation: Vertebral arteries are widely patent and contribute equally to the formation of basilar. No cerebral branch stenosis or occlusion. Fetal origin LEFT PCA with rudimentary LEFT P1 connector. No intracranial aneurysm. IMPRESSION: Global atrophy. Mild to moderate small vessel disease. No acute intracranial findings. Multiple potentially compressive abnormalities related to degenerative disc disease in the cervical spine, including pannus surrounding the odontoid. If there are signs and symptoms of radiculopathy or myelopathy, consider cervical spine MRI for further evaluation. BILATERAL 50-75% stenoses of the cervical internal carotid arteries, at the junction of the cavernous/supraclinoid ICAs. These are not necessarily flow reducing nor are they an unusual finding given the patient's stated age. Electronically Signed   By: Elsie Stain M.D.   On: 02/27/2016 18:28   I have personally reviewed and evaluated these images and lab results as part of my medical decision-making.   EKG Interpretation None      MDM   Final diagnoses:  Transient cerebral ischemia, unspecified transient cerebral ischemia type   Patient emergency department with episode of blurred vision, unknown time of onset, patient noticed it when reading a book this morning. Also some persistent tingling in the left arm that has not resolved as well. Patient admits during these episodes she was feeling anxious. Recent admission for TIA workup. Just discharged yesterday. Exam was normal. Patient is in no distress. I discussed patient with neurology to see if  any more imaging or labs would be recommended at this time. Given no complaints and no symptoms or findings on exam, outpatient follow-up is recommended. Patient is just mildly hypertensive in emergency department, blood pressures are 150s/ 60s.  Will start on full strength aspirin. Follow-up outpatient with neurology. Patient states she has seen a urologist in the past and would like to follow-up with the same 1. She will make a phone call. Pt agreeable to the plan. Return precautions discussed.   Filed Vitals:   02/29/16 1345 02/29/16 1400 02/29/16 1415 02/29/16 1430  BP: 158/60 150/63 152/67 154/63  Pulse: 65 65 63 61  Temp:      Resp: 14 20 14 16   Height:      Weight:      SpO2: 100% 99% 100% 100%     Jaynie Crumble, PA-C 02/29/16 1543  Raeford Razor, MD 03/02/16 [phone]

## 2016-02-29 NOTE — Discharge Instructions (Signed)
Start taking full strength aspirin daily. Follow up with neurologist outpatient. Return if worsening and not resolving symptoms.

## 2016-02-29 NOTE — ED Notes (Signed)
Pt arrives from [facility]CP via [facility]. Pt was d/c yesterday after having a TIA on 02/26/16. Pt states she continues to have left sided numbness and tingling, pt noticed today she was having blurred vision while trying to read. Pt states she doesn't know when that started rt not having tried to read anything since the TIA. Pt has c/o dizziness with sudden movements and states she has no dizziness if she moves slowly. Pt also endorses increased heaviness in her legs bilaterally with more heaviness noted in the left leg rt arthritis per pt. Pt states she was feeling anxious and states that resolved once getting to the ED.

## 2016-03-06 DIAGNOSIS — F411 Generalized anxiety disorder: Secondary | ICD-10-CM | POA: Diagnosis not present

## 2016-03-08 ENCOUNTER — Encounter (HOSPITAL_COMMUNITY): Payer: Self-pay | Admitting: *Deleted

## 2016-03-08 ENCOUNTER — Emergency Department (HOSPITAL_COMMUNITY)
Admission: EM | Admit: 2016-03-08 | Discharge: 2016-03-08 | Disposition: A | Discharge status: Home / Self Care | Payer: Medicare Other | Attending: Emergency Medicine | Admitting: Emergency Medicine

## 2016-03-08 ENCOUNTER — Emergency Department (HOSPITAL_COMMUNITY): Payer: Medicare Other

## 2016-03-08 DIAGNOSIS — Z79899 Other long term (current) drug therapy: Secondary | ICD-10-CM | POA: Insufficient documentation

## 2016-03-08 DIAGNOSIS — Z8673 Personal history of transient ischemic attack (TIA), and cerebral infarction without residual deficits: Secondary | ICD-10-CM | POA: Insufficient documentation

## 2016-03-08 DIAGNOSIS — R200 Anesthesia of skin: Secondary | ICD-10-CM | POA: Diagnosis present

## 2016-03-08 DIAGNOSIS — Z87891 Personal history of nicotine dependence: Secondary | ICD-10-CM | POA: Diagnosis not present

## 2016-03-08 DIAGNOSIS — K219 Gastro-esophageal reflux disease without esophagitis: Secondary | ICD-10-CM | POA: Diagnosis not present

## 2016-03-08 DIAGNOSIS — I10 Essential (primary) hypertension: Secondary | ICD-10-CM

## 2016-03-08 DIAGNOSIS — Z7982 Long term (current) use of aspirin: Secondary | ICD-10-CM | POA: Diagnosis not present

## 2016-03-08 DIAGNOSIS — E785 Hyperlipidemia, unspecified: Secondary | ICD-10-CM | POA: Diagnosis not present

## 2016-03-08 HISTORY — DX: Transient cerebral ischemic attack, unspecified: G45.9

## 2016-03-08 LAB — APTT: aPTT: 34 seconds (ref 24–37)

## 2016-03-08 LAB — DIFFERENTIAL
Basophils Absolute: 0 10*3/uL (ref 0.0–0.1)
Basophils Relative: 0 %
EOS PCT: 3 %
Eosinophils Absolute: 0.3 10*3/uL (ref 0.0–0.7)
LYMPHS ABS: 3 10*3/uL (ref 0.7–4.0)
LYMPHS PCT: 27 %
MONO ABS: 1.1 10*3/uL — AB (ref 0.1–1.0)
MONOS PCT: 10 %
NEUTROS ABS: 6.5 10*3/uL (ref 1.7–7.7)
Neutrophils Relative %: 60 %

## 2016-03-08 LAB — URINALYSIS, ROUTINE W REFLEX MICROSCOPIC
BILIRUBIN URINE: NEGATIVE
Glucose, UA: NEGATIVE mg/dL
HGB URINE DIPSTICK: NEGATIVE
Ketones, ur: NEGATIVE mg/dL
Leukocytes, UA: NEGATIVE
Nitrite: NEGATIVE
PH: 6.5 (ref 5.0–8.0)
Protein, ur: NEGATIVE mg/dL
SPECIFIC GRAVITY, URINE: 1.005 (ref 1.005–1.030)

## 2016-03-08 LAB — I-STAT CHEM 8, ED
BUN: 15 mg/dL (ref 6–20)
CALCIUM ION: 1.13 mmol/L (ref 1.13–1.30)
CHLORIDE: 96 mmol/L — AB (ref 101–111)
Creatinine, Ser: 1.1 mg/dL — ABNORMAL HIGH (ref 0.44–1.00)
GLUCOSE: 89 mg/dL (ref 65–99)
HCT: 41 % (ref 36.0–46.0)
Hemoglobin: 13.9 g/dL (ref 12.0–15.0)
Potassium: 4 mmol/L (ref 3.5–5.1)
Sodium: 130 mmol/L — ABNORMAL LOW (ref 135–145)
TCO2: 24 mmol/L (ref 0–100)

## 2016-03-08 LAB — CBC
HEMATOCRIT: 37.7 % (ref 36.0–46.0)
Hemoglobin: 12.3 g/dL (ref 12.0–15.0)
MCH: 32.9 pg (ref 26.0–34.0)
MCHC: 32.6 g/dL (ref 30.0–36.0)
MCV: 100.8 fL — AB (ref 78.0–100.0)
Platelets: 284 10*3/uL (ref 150–400)
RBC: 3.74 MIL/uL — AB (ref 3.87–5.11)
RDW: 12.2 % (ref 11.5–15.5)
WBC: 10.8 10*3/uL — AB (ref 4.0–10.5)

## 2016-03-08 LAB — COMPREHENSIVE METABOLIC PANEL
ALK PHOS: 46 U/L (ref 38–126)
ALT: 13 U/L — AB (ref 14–54)
AST: 18 U/L (ref 15–41)
Albumin: 3.6 g/dL (ref 3.5–5.0)
Anion gap: 12 (ref 5–15)
BILIRUBIN TOTAL: 0.7 mg/dL (ref 0.3–1.2)
BUN: 13 mg/dL (ref 6–20)
CALCIUM: 9.2 mg/dL (ref 8.9–10.3)
CO2: 20 mmol/L — ABNORMAL LOW (ref 22–32)
Chloride: 97 mmol/L — ABNORMAL LOW (ref 101–111)
Creatinine, Ser: 1.05 mg/dL — ABNORMAL HIGH (ref 0.44–1.00)
GFR, EST AFRICAN AMERICAN: 55 mL/min — AB (ref >60)
GFR, EST NON AFRICAN AMERICAN: 48 mL/min — AB (ref >60)
GLUCOSE: 93 mg/dL (ref 65–99)
Potassium: 4 mmol/L (ref 3.5–5.1)
Sodium: 129 mmol/L — ABNORMAL LOW (ref 135–145)
TOTAL PROTEIN: 7.3 g/dL (ref 6.5–8.1)

## 2016-03-08 LAB — I-STAT TROPONIN, ED: TROPONIN I, POC: 0.01 ng/mL (ref 0.00–0.08)

## 2016-03-08 LAB — PROTIME-INR
INR: 1.04 (ref 0.00–1.49)
Prothrombin Time: 13.8 seconds (ref 11.6–15.2)

## 2016-03-08 LAB — CBG MONITORING, ED
GLUCOSE-CAPILLARY: 88 mg/dL (ref 65–99)
Glucose-Capillary: 88 mg/dL (ref 65–99)

## 2016-03-08 MED ORDER — HYDRALAZINE HCL 20 MG/ML IJ SOLN
10.0000 mg | Freq: Once | INTRAMUSCULAR | Status: AC
  Administered 2016-03-08: 10 mg via INTRAVENOUS
  Filled 2016-03-08: qty 1

## 2016-03-08 NOTE — ED Notes (Signed)
Per pt, she was sitting outside at approx 5:15 and then began to feel "woozy" pt then became flushed to face, neck and chest. Pt reports "just not feeling well" but also has numbness sensation to entire left side of body. Grips are equal, no arm drift noted, no facial droop noted.

## 2016-03-08 NOTE — ED Provider Notes (Signed)
CSN: [id number]     Arrival date & time 03/08/16  1846 History   First MD Initiated Contact with Patient 03/08/16 1922     Chief Complaint  Patient presents with  . Numbness     (Consider location/radiation/quality/duration/timing/severity/associated sxs/prior Treatment) Patient is a 82 y.o. female presenting with neurologic complaint.  Neurologic Problem This is a recurrent problem. The current episode started 1 to 2 hours ago. The problem has been gradually improving. Pertinent negatives include no chest pain, no headaches and no shortness of breath. Nothing aggravates the symptoms. Nothing relieves the symptoms. She has tried nothing for the symptoms. The treatment provided no relief.    Past Medical History  Diagnosis Date  . Hypertension   . Hyperlipemia   . GERD (gastroesophageal reflux disease)   . TIA (transient ischemic attack)    Past Surgical History  Procedure Laterality Date  . Abdominal hysterectomy    . Tonsillectomy     Family History  Problem Relation Age of Onset  . Hypertension Mother   . Hyperlipidemia Father   . Hypertension Father    Social History  Substance Use Topics  . Smoking status: Former Smoker -- 20 years    Types: Cigarettes    Quit date: 04/30/1963  . Smokeless tobacco: Never Used  . Alcohol Use: No   OB History    No data available     Review of Systems  Constitutional: Negative for fever.  Eyes: Negative for pain.  Respiratory: Negative for shortness of breath.   Cardiovascular: Negative for chest pain.  Gastrointestinal: Negative for nausea and vomiting.  Endocrine: Negative for polydipsia and polyuria.  Skin: Negative for rash and wound.  Neurological: Positive for numbness (paresthesias in left arm, face, leg and body). Negative for dizziness, facial asymmetry, speech difficulty, weakness and headaches.  All other systems reviewed and are negative.     Allergies  Sulfa antibiotics  Home Medications   Prior to  Admission medications   Medication Sig Start Date End Date Taking? Authorizing Provider  acetaminophen (TYLENOL) 500 MG tablet Take 1,000 mg by mouth 2 (two) times daily.   Yes Historical Provider, MD  aspirin EC 325 MG tablet Take 1 tablet (325 mg total) by mouth daily. 02/29/16  Yes Tatyana Kirichenko, PA-C  Calcium Carbonate Antacid (TUMS E-X PO) Take 2 tablets by mouth daily as needed (acid reflux).   Yes Historical Provider, MD  carvedilol (COREG) 6.25 MG tablet Take 6.25 mg by mouth 2 (two) times daily with a meal.   Yes Historical Provider, MD  lisinopril (PRINIVIL,ZESTRIL) 20 MG tablet Take 20 mg by mouth daily with lunch.    Yes Historical Provider, MD  lovastatin (MEVACOR) 20 MG tablet Take 20 mg by mouth daily with supper.    Yes Historical Provider, MD  ranitidine (ZANTAC) 150 MG tablet Take 150 mg by mouth 2 (two) times daily before a meal.    Yes Historical Provider, MD  trolamine salicylate (ASPERCREME) 10 % cream Apply 1 application topically 2 (two) times daily as needed for muscle pain (leg cramps/swelling). Apply's to knee's   Yes Historical Provider, MD   BP 143/55 mmHg  Pulse 77  Temp(Src) 97.9 F (36.6 C) (Oral)  Resp 23  SpO2 100% Physical Exam  Constitutional: She is oriented to person, place, and time. She appears well-developed and well-nourished.  HENT:  Head: Normocephalic and atraumatic.  Neck: Normal range of motion.  Cardiovascular: Normal rate and regular rhythm.   Pulmonary/Chest: No stridor. No respiratory  distress.  Abdominal: Soft. She exhibits no distension. There is no tenderness.  Musculoskeletal: Normal range of motion. She exhibits no edema or tenderness.  Neurological: She is alert and oriented to person, place, and time.  No altered mental status, able to give full seemingly accurate history.  Face is symmetric, EOM's intact, pupils equal and reactive, vision intact, tongue and uvula midline without deviation Upper and Lower extremity motor 5/5,  intact pain perception in distal extremities, 2+ reflexes in biceps, patella and achilles tendons. Finger to nose normal, heel to shin normal.   Skin: Skin is warm and dry. No rash noted. No erythema.  Nursing note and vitals reviewed.   ED Course  Procedures (including critical care time) Labs Review Labs Reviewed  CBC - Abnormal; Notable for the following:    WBC 10.8 (*)    RBC 3.74 (*)    MCV 100.8 (*)    All other components within normal limits  DIFFERENTIAL - Abnormal; Notable for the following:    Monocytes Absolute 1.1 (*)    All other components within normal limits  COMPREHENSIVE METABOLIC PANEL - Abnormal; Notable for the following:    Sodium 129 (*)    Chloride 97 (*)    CO2 20 (*)    Creatinine, Ser 1.05 (*)    ALT 13 (*)    GFR calc non Af Amer 48 (*)    GFR calc Af Amer 55 (*)    All other components within normal limits  I-STAT CHEM 8, ED - Abnormal; Notable for the following:    Sodium 130 (*)    Chloride 96 (*)    Creatinine, Ser 1.10 (*)    All other components within normal limits  PROTIME-INR  APTT  URINALYSIS, ROUTINE W REFLEX MICROSCOPIC (NOT AT [facility])  CBG MONITORING, ED  I-STAT TROPOININ, ED  CBG MONITORING, ED    Imaging Review Ct Head Wo Contrast  03/08/2016  CLINICAL DATA:  Left-sided numbness and tingling.  Hypertension EXAM: CT HEAD WITHOUT CONTRAST TECHNIQUE: Contiguous axial images were obtained from the base of the skull through the vertex without intravenous contrast. COMPARISON:  brain MRI February 27, 2016 ; head CT February 27, 2016 FINDINGS: There is age related volume loss. There is no intracranial mass, hemorrhage, extra-axial fluid collection, or midline shift. Gray-white compartments appear normal. No acute infarct evident. The bony calvarium appears intact. The mastoid air cells are clear. No intraorbital lesions are apparent. There is incomplete visualization of a retention cyst in the posterior left maxillary antrum. There is slight  leftward deviation the nasal septum. IMPRESSION: Retention cyst in left maxillary antrum posteriorly. There is age related volume loss. No intracranial mass, hemorrhage, or focal gray -white compartment lesion. No acute infarct is evident on this study. Electronically Signed   By: Bretta BangWilliam  Woodruff III M.D.   On: 03/08/2016 20:09   I have personally reviewed and evaluated these images and lab results as part of my medical decision-making.   EKG Interpretation   Date/Time:  Wednesday March 08 2016 18:54:31 EDT Ventricular Rate:  76 PR Interval:  146 QRS Duration: 84 QT Interval:  378 QTC Calculation: 425 R Axis:   43 Text Interpretation:  Normal sinus rhythm Normal ECG Confirmed by [facility]  MD, Barbara CowerJASON [phone]([id number]) on 03/08/2016 7:16:06 PM Also confirmed by [facility],  Conchetta Lamia [phone]([id number]), editor [place]  CCT, BEVERLY ([id number])  on 03/09/2016  7:57:34 AM      MDM   Final diagnoses:  Essential hypertension  Left sided paresthesias when her BP is high. Improves when BP improves. Suspect hypertensive crisis rather than CVA as she has had this a couple times before with totally negative workups aside from BP. After BP control in the ED her symptoms resolved. No objective findings even prior to the symptoms. Observed for a couple hours afterwards with continued improvement in siymptoms and BP. Neuro exam unchanged. Suggested improving her BP control, which she will work with her doctor.   New Prescriptions: Discharge Medication List as of 03/08/2016 10:45 PM       I have personally and contemperaneously reviewed labs and imaging and used in my decision making as above.   A medical screening exam was performed and I feel the patient has had an appropriate workup for their chief complaint at this time and likelihood of emergent condition existing is low. Their vital signs are stable. They have been counseled on decision, discharge, follow up and which symptoms necessitate immediate return to the  emergency department.  They verbally stated understanding and agreement with plan and discharged in stable condition.      Marily Memos, MD 03/09/16 1044

## 2016-03-08 NOTE — ED Notes (Signed)
Provider bedside.

## 2016-06-30 DIAGNOSIS — M545 Low back pain: Secondary | ICD-10-CM | POA: Diagnosis not present

## 2016-06-30 DIAGNOSIS — K649 Unspecified hemorrhoids: Secondary | ICD-10-CM | POA: Diagnosis not present

## 2016-06-30 DIAGNOSIS — R8299 Other abnormal findings in urine: Secondary | ICD-10-CM | POA: Diagnosis not present

## 2016-08-07 DIAGNOSIS — K219 Gastro-esophageal reflux disease without esophagitis: Secondary | ICD-10-CM | POA: Diagnosis not present

## 2016-08-07 DIAGNOSIS — M170 Bilateral primary osteoarthritis of knee: Secondary | ICD-10-CM | POA: Diagnosis not present

## 2016-08-07 DIAGNOSIS — I10 Essential (primary) hypertension: Secondary | ICD-10-CM | POA: Diagnosis not present

## 2016-08-17 DIAGNOSIS — N390 Urinary tract infection, site not specified: Secondary | ICD-10-CM | POA: Diagnosis not present

## 2016-10-06 DIAGNOSIS — Z23 Encounter for immunization: Secondary | ICD-10-CM | POA: Diagnosis not present

## 2016-10-16 DIAGNOSIS — H2513 Age-related nuclear cataract, bilateral: Secondary | ICD-10-CM | POA: Diagnosis not present

## 2016-10-16 DIAGNOSIS — H52203 Unspecified astigmatism, bilateral: Secondary | ICD-10-CM | POA: Diagnosis not present

## 2016-11-23 DIAGNOSIS — H2512 Age-related nuclear cataract, left eye: Secondary | ICD-10-CM | POA: Diagnosis not present

## 2016-11-23 DIAGNOSIS — H25812 Combined forms of age-related cataract, left eye: Secondary | ICD-10-CM | POA: Diagnosis not present

## 2016-12-24 DIAGNOSIS — T161XXA Foreign body in right ear, initial encounter: Secondary | ICD-10-CM | POA: Diagnosis not present

## 2016-12-24 DIAGNOSIS — H6123 Impacted cerumen, bilateral: Secondary | ICD-10-CM | POA: Diagnosis not present

## 2017-01-29 DIAGNOSIS — K219 Gastro-esophageal reflux disease without esophagitis: Secondary | ICD-10-CM | POA: Diagnosis not present

## 2017-01-29 DIAGNOSIS — I10 Essential (primary) hypertension: Secondary | ICD-10-CM | POA: Diagnosis not present

## 2017-01-29 DIAGNOSIS — E782 Mixed hyperlipidemia: Secondary | ICD-10-CM | POA: Diagnosis not present

## 2017-01-29 DIAGNOSIS — M170 Bilateral primary osteoarthritis of knee: Secondary | ICD-10-CM | POA: Diagnosis not present

## 2017-01-29 DIAGNOSIS — R7301 Impaired fasting glucose: Secondary | ICD-10-CM | POA: Diagnosis not present

## 2017-02-13 IMAGING — CT CT HEAD W/O CM
2 series · 14 of 30 positions shown, 16 images · non-contrast
Comparison: CT of the head and MRI of the brain performed
04/29/2014

CLINICAL DATA: Acute onset of left-sided numbness and tingling.
Initial encounter.

EXAM:
CT HEAD WITHOUT CONTRAST
TECHNIQUE: Contiguous axial images were obtained from the base of the skull
through the vertex without intravenous contrast.

[Series 2: head without · axial · non-contrast · 0.40mm/px · z∈[+1354,+1454]mm · 6 of 28 slices shown, 8 images]
[im 4/28  brain]
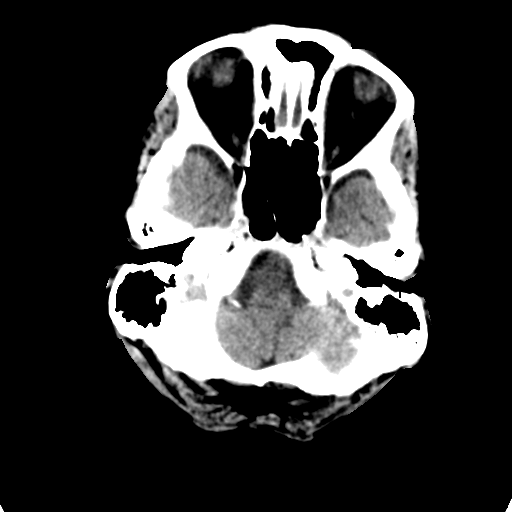
[im 4/28  bone]
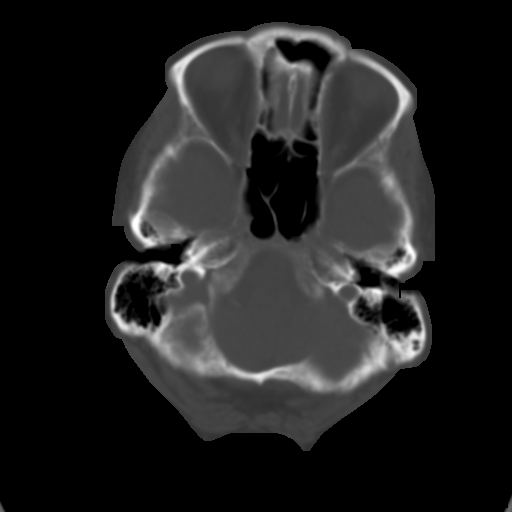
[im 8/28  brain]
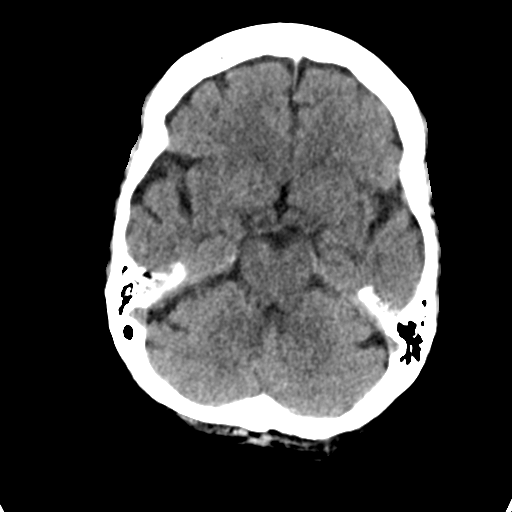
[im 12/28  brain]
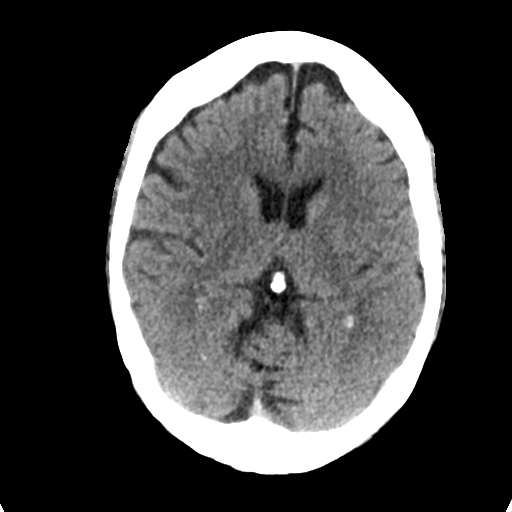
[im 16/28  brain]
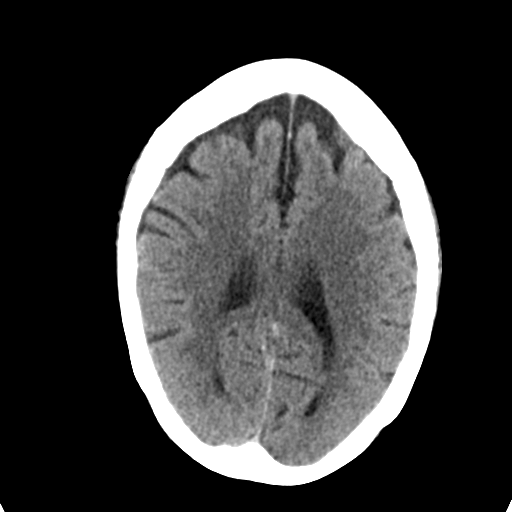
[im 20/28  brain]
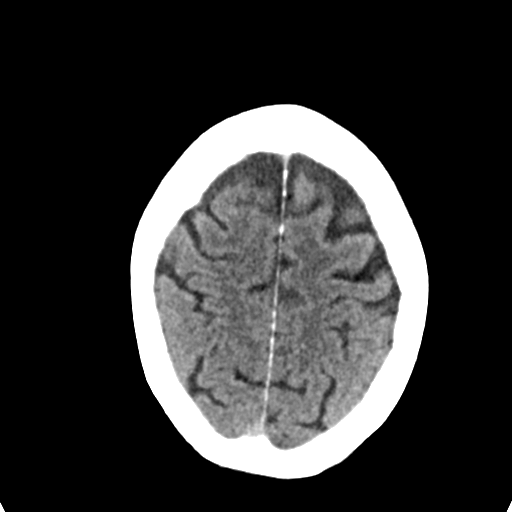
[im 20/28  bone]
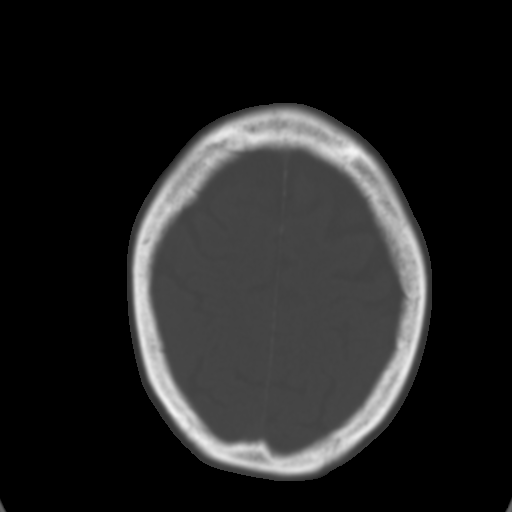
[im 24/28  brain]
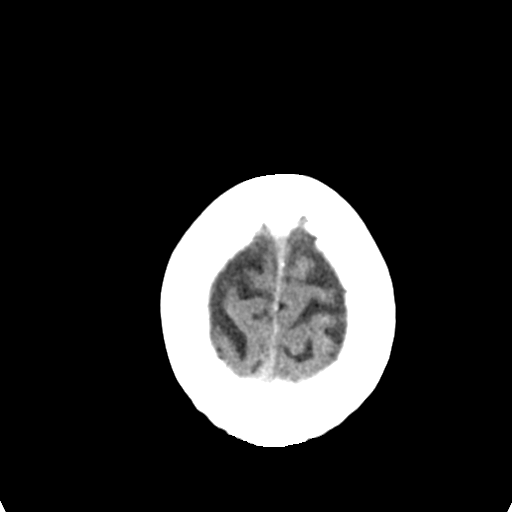

[Series 3: head bone · axial · 0.40mm/px · z∈[+1351,+1467]mm · 8 of 72 slices shown]
[im 7/72  bone]
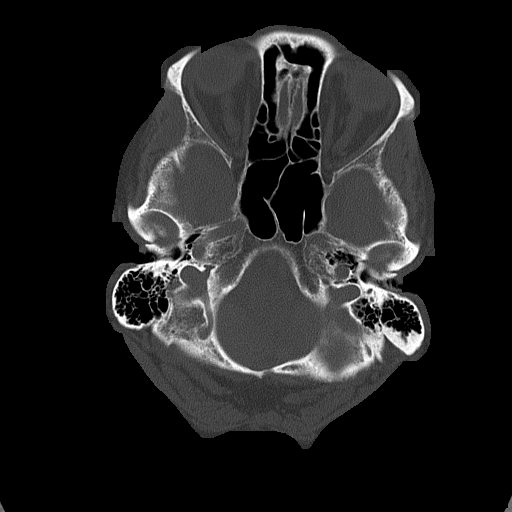
[im 14/72  bone]
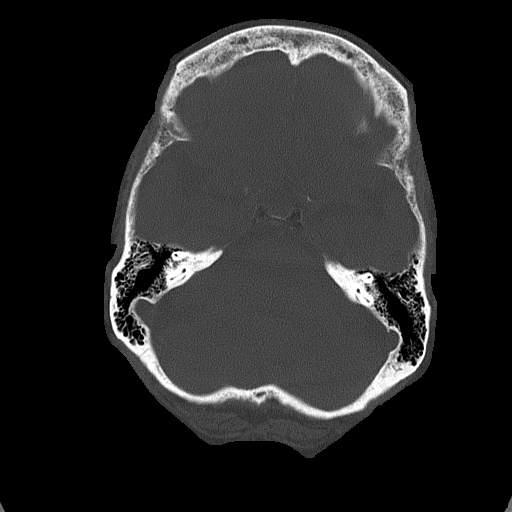
[im 24/72  bone]
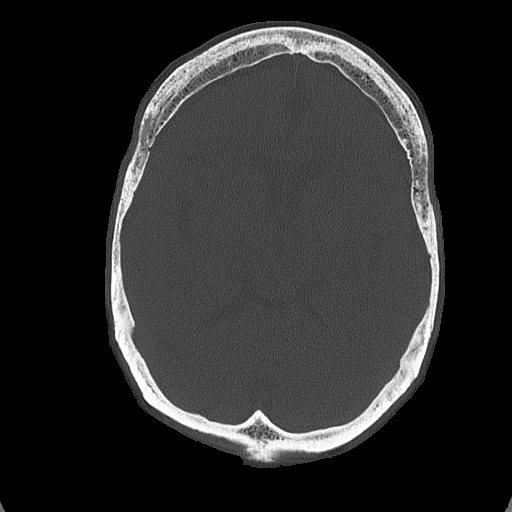
[im 31/72  bone]
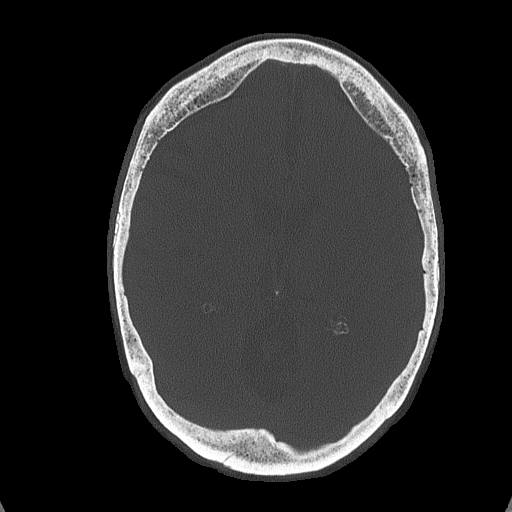
[im 41/72  bone]
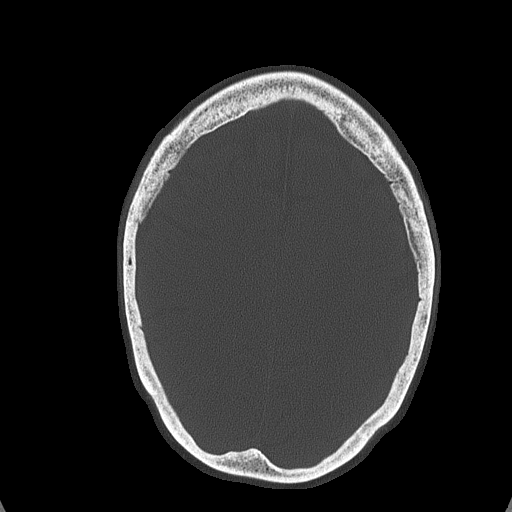
[im 48/72  bone]
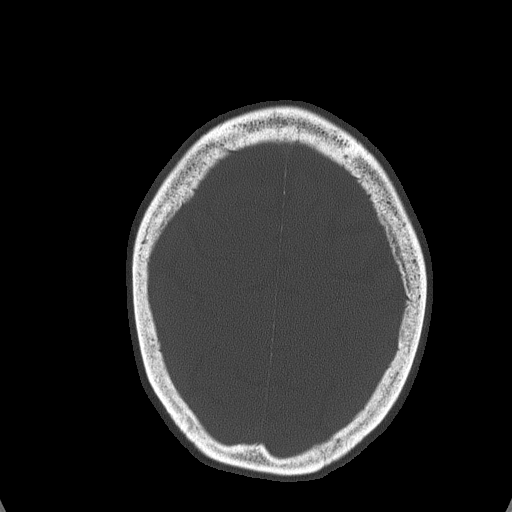
[im 58/72  bone]
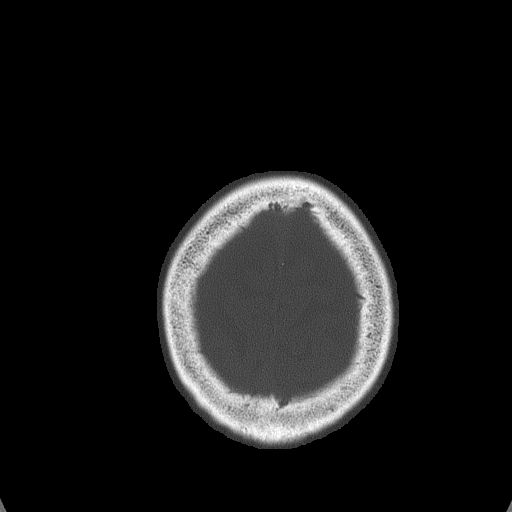
[im 65/72  bone]
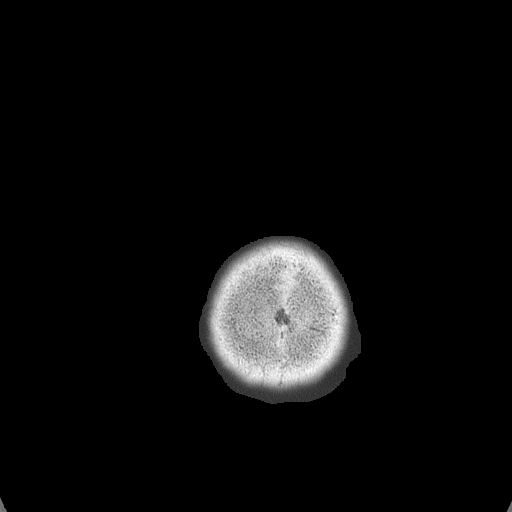

[14 of 30 positions shown; findings below may reference images not displayed]

FINDINGS: There is no evidence of acute infarction, mass lesion, or intra- or
extra-axial hemorrhage on CT.

Prominence of the ventricles and sulci reflects mild cortical volume
loss.

The brainstem and fourth ventricle are within normal limits. The
basal ganglia are unremarkable in appearance. The cerebral
hemispheres demonstrate grossly normal gray-white differentiation.
No mass effect or midline shift is seen.

There is no evidence of fracture; visualized osseous structures are
unremarkable in appearance. The visualized portions of the orbits
are within normal limits. The paranasal sinuses and mastoid air
cells are well-aerated. No significant soft tissue abnormalities are
seen.
IMPRESSION: 1. No acute intracranial pathology seen on CT.
2. Mild cortical volume loss noted.

## 2017-04-23 DIAGNOSIS — S29011A Strain of muscle and tendon of front wall of thorax, initial encounter: Secondary | ICD-10-CM | POA: Diagnosis not present

## 2017-04-23 DIAGNOSIS — W19XXXA Unspecified fall, initial encounter: Secondary | ICD-10-CM | POA: Diagnosis not present

## 2017-08-13 DIAGNOSIS — F419 Anxiety disorder, unspecified: Secondary | ICD-10-CM | POA: Diagnosis not present

## 2017-08-13 DIAGNOSIS — Z0000 Encounter for general adult medical examination without abnormal findings: Secondary | ICD-10-CM | POA: Diagnosis not present

## 2017-08-13 DIAGNOSIS — K219 Gastro-esophageal reflux disease without esophagitis: Secondary | ICD-10-CM | POA: Diagnosis not present

## 2017-08-13 DIAGNOSIS — I10 Essential (primary) hypertension: Secondary | ICD-10-CM | POA: Diagnosis not present

## 2017-08-13 DIAGNOSIS — M79605 Pain in left leg: Secondary | ICD-10-CM | POA: Diagnosis not present

## 2017-08-13 DIAGNOSIS — R7301 Impaired fasting glucose: Secondary | ICD-10-CM | POA: Diagnosis not present

## 2017-08-13 DIAGNOSIS — E782 Mixed hyperlipidemia: Secondary | ICD-10-CM | POA: Diagnosis not present

## 2017-09-06 DIAGNOSIS — Z23 Encounter for immunization: Secondary | ICD-10-CM | POA: Diagnosis not present

## 2017-11-25 DIAGNOSIS — R202 Paresthesia of skin: Secondary | ICD-10-CM | POA: Diagnosis not present

## 2017-11-25 DIAGNOSIS — I10 Essential (primary) hypertension: Secondary | ICD-10-CM | POA: Diagnosis not present

## 2017-12-03 DIAGNOSIS — I10 Essential (primary) hypertension: Secondary | ICD-10-CM | POA: Diagnosis not present

## 2017-12-03 DIAGNOSIS — K219 Gastro-esophageal reflux disease without esophagitis: Secondary | ICD-10-CM | POA: Diagnosis not present

## 2017-12-04 ENCOUNTER — Encounter (HOSPITAL_COMMUNITY): Payer: Self-pay | Admitting: Emergency Medicine

## 2017-12-04 DIAGNOSIS — Z7982 Long term (current) use of aspirin: Secondary | ICD-10-CM | POA: Diagnosis not present

## 2017-12-04 DIAGNOSIS — I10 Essential (primary) hypertension: Secondary | ICD-10-CM | POA: Diagnosis not present

## 2017-12-04 DIAGNOSIS — Z79899 Other long term (current) drug therapy: Secondary | ICD-10-CM | POA: Diagnosis not present

## 2017-12-04 DIAGNOSIS — I6789 Other cerebrovascular disease: Secondary | ICD-10-CM | POA: Diagnosis not present

## 2017-12-04 DIAGNOSIS — Z87891 Personal history of nicotine dependence: Secondary | ICD-10-CM | POA: Diagnosis not present

## 2017-12-04 DIAGNOSIS — R200 Anesthesia of skin: Secondary | ICD-10-CM | POA: Diagnosis not present

## 2017-12-04 NOTE — ED Triage Notes (Signed)
Per EMS:  Pt from [facility] (independent living).  Pt has been working with her PCP to control her BP since Christmas Eve.  Today she started Norvasc and "a medicine for reflux".  Tonight after taking her dose of Norvasc patient c/o moving numbness and tingling (first left arm, then right leg, then left leg, then both, then right arm).  Pt also c/o burning and redness to her bilateral lower legs and feet.  All symptoms have resolved at this time.  No neuro deficits noted.

## 2017-12-05 ENCOUNTER — Emergency Department (HOSPITAL_COMMUNITY)
Admission: EM | Admit: 2017-12-05 | Discharge: 2017-12-05 | Disposition: A | Discharge status: Home / Self Care | Payer: Medicare Other | Attending: Emergency Medicine | Admitting: Emergency Medicine

## 2017-12-05 DIAGNOSIS — I10 Essential (primary) hypertension: Secondary | ICD-10-CM

## 2017-12-05 LAB — BASIC METABOLIC PANEL
Anion gap: 7 (ref 5–15)
BUN: 10 mg/dL (ref 6–20)
CALCIUM: 9.2 mg/dL (ref 8.9–10.3)
CHLORIDE: 100 mmol/L — AB (ref 101–111)
CO2: 27 mmol/L (ref 22–32)
CREATININE: 0.82 mg/dL (ref 0.44–1.00)
GFR calc non Af Amer: >60 mL/min (ref >60)
Glucose, Bld: 96 mg/dL (ref 65–99)
Potassium: 3.8 mmol/L (ref 3.5–5.1)
SODIUM: 134 mmol/L — AB (ref 135–145)

## 2017-12-05 LAB — CBC
HCT: 38.9 % (ref 36.0–46.0)
Hemoglobin: 12.4 g/dL (ref 12.0–15.0)
MCH: 32.8 pg (ref 26.0–34.0)
MCHC: 31.9 g/dL (ref 30.0–36.0)
MCV: 102.9 fL — ABNORMAL HIGH (ref 78.0–100.0)
Platelets: 271 10*3/uL (ref 150–400)
RBC: 3.78 MIL/uL — ABNORMAL LOW (ref 3.87–5.11)
RDW: 12.1 % (ref 11.5–15.5)
WBC: 9.8 10*3/uL (ref 4.0–10.5)

## 2017-12-05 MED ORDER — LISINOPRIL 20 MG PO TABS
40.0000 mg | ORAL_TABLET | Freq: Once | ORAL | Status: AC
Start: 2017-12-05 — End: 2017-12-05
  Administered 2017-12-05: 40 mg via ORAL
  Filled 2017-12-05: qty 2

## 2017-12-05 NOTE — Discharge Instructions (Signed)
Return to the ED with any concerns including chest pain, shortness of breath, changes in vision or speech, weakness of arms or legs, decreased level of alertness/lethargy, or any other alarming symptoms

## 2017-12-05 NOTE — ED Provider Notes (Signed)
[facility] EMERGENCY DEPARTMENT Provider Note   CSN: [id number] Arrival date & time: 12/04/17  2116     History   Chief Complaint Chief Complaint  Patient presents with  . Hypertension  . Allergic Reaction    HPI Jill Hernandez is a 84 y.o. female.  HPI  Patient with history of hypertension GERD presenting with complaint of trouble controlling her hypertension.  Last night she developed some tingling of her arms and legs and face.  She took her new blood pressure medication of Norvasc which she started 2 days ago.  By the time she was in the ambulance her symptoms of tingling had resolved and her blood pressure had improved.  She denies any difficulty speaking, changes in vision, weakness of her arms or legs.  She has had no chest pain or shortness of breath.  She has been monitoring her blood pressure twice a day over the past several weeks due to elevated readings.  She has been working with her primary care doctor to adjust her medications gradually for better blood pressure control.  At the time of my evaluation she has no active symptoms and feels at her baseline.  There are no other associated systemic symptoms, there are no other alleviating or modifying factors.   Past Medical History:  Diagnosis Date  . GERD (gastroesophageal reflux disease)   . Hyperlipemia   . Hypertension   . TIA (transient ischemic attack)     Patient Active Problem List   Diagnosis Date Noted  . Small vessel disease, cerebrovascular 02/28/2016  . Cerebral atrophy 02/28/2016  . Hyponatremia 02/27/2016  . Syncope 04/29/2014  . TIA (transient ischemic attack) 04/29/2014  . HTN (hypertension) 04/29/2014  . HLD (hyperlipidemia) 04/29/2014  . Left knee pain 04/29/2014    Past Surgical History:  Procedure Laterality Date  . ABDOMINAL HYSTERECTOMY    . TONSILLECTOMY      OB History    No data available       Home Medications    Prior to Admission medications     Medication Sig Start Date End Date Taking? Authorizing Provider  acetaminophen (TYLENOL) 500 MG tablet Take 1,000 mg by mouth 2 (two) times daily.   Yes [provider]  amLODipine (NORVASC) 2.5 MG tablet Take 2.5 mg by mouth daily.   Yes [provider]  aspirin EC 325 MG tablet Take 1 tablet (325 mg total) by mouth daily. 02/29/16  Yes Kirichenko, Tatyana, PA-C  carvedilol (COREG) 6.25 MG tablet Take 6.25 mg by mouth 2 (two) times daily with a meal.   Yes [provider]  lisinopril (PRINIVIL,ZESTRIL) 20 MG tablet Take 20-40 mg by mouth 2 (two) times daily. 40 mg in the morning- 20 mg at supper   Yes [provider]  lovastatin (MEVACOR) 20 MG tablet Take 20 mg by mouth daily with supper.    Yes [provider]  ranitidine (ZANTAC) 150 MG tablet Take 150 mg by mouth 2 (two) times daily before a meal.    Yes [provider]  trolamine salicylate (ASPERCREME) 10 % cream Apply 1 application topically 2 (two) times daily as needed for muscle pain (leg cramps/swelling). Apply's to knee's   Yes [provider]    Family History Family History  Problem Relation Age of Onset  . Hypertension Mother   . Hyperlipidemia Father   . Hypertension Father     Social History Social History   Tobacco Use  . Smoking status: Former  Smoker    Years: 20.00    Types: Cigarettes    Last attempt to quit: 04/30/1963    Years since quitting: 54.6  . Smokeless tobacco: Never Used  Substance Use Topics  . Alcohol use: No  . Drug use: No     Allergies   Sulfa antibiotics   Review of Systems Review of Systems  ROS reviewed and all otherwise negative except for mentioned in HPI   Physical Exam Updated Vital Signs BP (!) 159/84   Pulse 76   Temp 98.1 F (36.7 C) (Oral)   Resp 17   SpO2 100%  Vitals reviewed Physical Exam  Physical Examination: General appearance - alert, well appearing, and in no distress Mental status - alert,  oriented to person, place, and time Eyes - pupils equal and reactive, extraocular eye movements intact Mouth - mucous membranes moist, pharynx normal without lesions Neck - supple, no significant adenopathy Chest - clear to auscultation, no wheezes, rales or rhonchi, symmetric air entry Heart - normal rate, regular rhythm, normal S1, S2, no murmurs, rubs, clicks or gallops Abdomen - soft, nontender, nondistended, no masses or organomegaly Neurological - alert, oriented x 3, cranial nerves 2-12 tested and intact, strength 5/5in extremities x 4, sensation intact Extremities - peripheral pulses normal, no pedal edema, no clubbing or cyanosis Skin - normal coloration and turgor, no rashes   ED Treatments / Results  Labs (all labs ordered are listed, but only abnormal results are displayed) Labs Reviewed  CBC - Abnormal; Notable for the following components:      Result Value   RBC 3.78 (*)    MCV 102.9 (*)    All other components within normal limits  BASIC METABOLIC PANEL - Abnormal; Notable for the following components:   Sodium 134 (*)    Chloride 100 (*)    All other components within normal limits    EKG  EKG Interpretation  Date/Time:  Wednesday December 05 2017 11:28:48 EST Ventricular Rate:  72 PR Interval:    QRS Duration: 92 QT Interval:  409 QTC Calculation: 448 R Axis:   33 Text Interpretation:  Sinus rhythm Baseline wander in lead(s) V3 V5 No significant change since last tracing Confirmed by Jerelyn Scott [phone]) on 12/05/2017 11:44:17 AM       Radiology No results found.  Procedures Procedures (including critical care time)  Medications Ordered in ED Medications  lisinopril (PRINIVIL,ZESTRIL) tablet 40 mg (40 mg Oral Given 12/05/17 1301)     Initial Impression / Assessment and Plan / ED Course  I have reviewed the triage vital signs and the nursing notes.  Pertinent labs & imaging results that were available during my care of the patient were reviewed  by me and considered in my medical decision making (see chart for details).     Patient presenting with complaint of hypertension.  She had some tingling in her face arms and legs which resolves when her blood pressure decreased.  She has had some medication changes recently and was concerned whether or not to continue with these medications.  I advised her to continue the Norvasc.  I do not feel this was an allergic reaction or an adverse reaction to the medication.  She has been asymptomatic during her long wait time prior to my evaluation.  She has no signs of endorgan damage on workup.  Discharged with strict return precautions.  Pt agreeable with plan.  Final Clinical Impressions(s) / ED Diagnoses   Final diagnoses:  Essential  hypertension    ED Discharge Orders    None       Phineas RealMabe, Latanya MaudlinMartha L, MD 12/05/17 1432

## 2018-02-04 DIAGNOSIS — E782 Mixed hyperlipidemia: Secondary | ICD-10-CM | POA: Diagnosis not present

## 2018-02-04 DIAGNOSIS — R7301 Impaired fasting glucose: Secondary | ICD-10-CM | POA: Diagnosis not present

## 2018-02-04 DIAGNOSIS — F419 Anxiety disorder, unspecified: Secondary | ICD-10-CM | POA: Diagnosis not present

## 2018-02-04 DIAGNOSIS — I10 Essential (primary) hypertension: Secondary | ICD-10-CM | POA: Diagnosis not present

## 2018-02-04 DIAGNOSIS — K219 Gastro-esophageal reflux disease without esophagitis: Secondary | ICD-10-CM | POA: Diagnosis not present

## 2018-02-04 DIAGNOSIS — R300 Dysuria: Secondary | ICD-10-CM | POA: Diagnosis not present

## 2018-02-04 DIAGNOSIS — M461 Sacroiliitis, not elsewhere classified: Secondary | ICD-10-CM | POA: Diagnosis not present

## 2018-02-20 DIAGNOSIS — H2511 Age-related nuclear cataract, right eye: Secondary | ICD-10-CM | POA: Diagnosis not present

## 2018-02-20 DIAGNOSIS — H52203 Unspecified astigmatism, bilateral: Secondary | ICD-10-CM | POA: Diagnosis not present

## 2018-03-19 DIAGNOSIS — N390 Urinary tract infection, site not specified: Secondary | ICD-10-CM | POA: Diagnosis not present

## 2018-03-19 DIAGNOSIS — M549 Dorsalgia, unspecified: Secondary | ICD-10-CM | POA: Diagnosis not present

## 2018-03-28 DIAGNOSIS — H2511 Age-related nuclear cataract, right eye: Secondary | ICD-10-CM | POA: Diagnosis not present

## 2018-03-28 DIAGNOSIS — H25811 Combined forms of age-related cataract, right eye: Secondary | ICD-10-CM | POA: Diagnosis not present

## 2018-03-28 DIAGNOSIS — H21561 Pupillary abnormality, right eye: Secondary | ICD-10-CM | POA: Diagnosis not present

## 2018-05-16 DIAGNOSIS — E782 Mixed hyperlipidemia: Secondary | ICD-10-CM | POA: Diagnosis not present

## 2018-05-16 DIAGNOSIS — I10 Essential (primary) hypertension: Secondary | ICD-10-CM | POA: Diagnosis not present

## 2018-05-16 DIAGNOSIS — K219 Gastro-esophageal reflux disease without esophagitis: Secondary | ICD-10-CM | POA: Diagnosis not present

## 2018-05-16 DIAGNOSIS — I129 Hypertensive chronic kidney disease with stage 1 through stage 4 chronic kidney disease, or unspecified chronic kidney disease: Secondary | ICD-10-CM | POA: Diagnosis not present

## 2018-05-16 DIAGNOSIS — N183 Chronic kidney disease, stage 3 (moderate): Secondary | ICD-10-CM | POA: Diagnosis not present

## 2018-05-16 DIAGNOSIS — R7303 Prediabetes: Secondary | ICD-10-CM | POA: Diagnosis not present

## 2018-08-23 DIAGNOSIS — K219 Gastro-esophageal reflux disease without esophagitis: Secondary | ICD-10-CM | POA: Diagnosis not present

## 2018-08-23 DIAGNOSIS — Z0000 Encounter for general adult medical examination without abnormal findings: Secondary | ICD-10-CM | POA: Diagnosis not present

## 2018-08-23 DIAGNOSIS — I10 Essential (primary) hypertension: Secondary | ICD-10-CM | POA: Diagnosis not present

## 2018-08-23 DIAGNOSIS — E782 Mixed hyperlipidemia: Secondary | ICD-10-CM | POA: Diagnosis not present

## 2018-08-23 DIAGNOSIS — M20031 Swan-neck deformity of right finger(s): Secondary | ICD-10-CM | POA: Diagnosis not present

## 2018-08-23 DIAGNOSIS — M170 Bilateral primary osteoarthritis of knee: Secondary | ICD-10-CM | POA: Diagnosis not present

## 2018-08-27 DIAGNOSIS — Z23 Encounter for immunization: Secondary | ICD-10-CM | POA: Diagnosis not present

## 2019-04-09 ENCOUNTER — Inpatient Hospital Stay (HOSPITAL_COMMUNITY)
Admission: EM | Admit: 2019-04-09 | Discharge: 2019-04-12 | DRG: 871 | Disposition: A | Discharge status: Home Health | Payer: Medicare Other | Attending: Family Medicine | Admitting: Family Medicine

## 2019-04-09 ENCOUNTER — Emergency Department (HOSPITAL_COMMUNITY): Payer: Medicare Other

## 2019-04-09 ENCOUNTER — Other Ambulatory Visit: Payer: Self-pay

## 2019-04-09 ENCOUNTER — Encounter (HOSPITAL_COMMUNITY): Payer: Self-pay | Admitting: Family Medicine

## 2019-04-09 DIAGNOSIS — I10 Essential (primary) hypertension: Secondary | ICD-10-CM | POA: Diagnosis present

## 2019-04-09 DIAGNOSIS — E860 Dehydration: Secondary | ICD-10-CM | POA: Diagnosis present

## 2019-04-09 DIAGNOSIS — I679 Cerebrovascular disease, unspecified: Secondary | ICD-10-CM | POA: Diagnosis not present

## 2019-04-09 DIAGNOSIS — N390 Urinary tract infection, site not specified: Secondary | ICD-10-CM | POA: Diagnosis present

## 2019-04-09 DIAGNOSIS — J189 Pneumonia, unspecified organism: Secondary | ICD-10-CM | POA: Diagnosis present

## 2019-04-09 DIAGNOSIS — Z90710 Acquired absence of both cervix and uterus: Secondary | ICD-10-CM

## 2019-04-09 DIAGNOSIS — Z8249 Family history of ischemic heart disease and other diseases of the circulatory system: Secondary | ICD-10-CM

## 2019-04-09 DIAGNOSIS — Z87891 Personal history of nicotine dependence: Secondary | ICD-10-CM | POA: Diagnosis not present

## 2019-04-09 DIAGNOSIS — E871 Hypo-osmolality and hyponatremia: Secondary | ICD-10-CM | POA: Diagnosis present

## 2019-04-09 DIAGNOSIS — F419 Anxiety disorder, unspecified: Secondary | ICD-10-CM | POA: Diagnosis present

## 2019-04-09 DIAGNOSIS — A419 Sepsis, unspecified organism: Principal | ICD-10-CM | POA: Diagnosis present

## 2019-04-09 DIAGNOSIS — R55 Syncope and collapse: Secondary | ICD-10-CM | POA: Diagnosis present

## 2019-04-09 DIAGNOSIS — H9190 Unspecified hearing loss, unspecified ear: Secondary | ICD-10-CM | POA: Diagnosis present

## 2019-04-09 DIAGNOSIS — Z20828 Contact with and (suspected) exposure to other viral communicable diseases: Secondary | ICD-10-CM | POA: Diagnosis present

## 2019-04-09 DIAGNOSIS — Z8349 Family history of other endocrine, nutritional and metabolic diseases: Secondary | ICD-10-CM

## 2019-04-09 DIAGNOSIS — R112 Nausea with vomiting, unspecified: Secondary | ICD-10-CM | POA: Diagnosis not present

## 2019-04-09 DIAGNOSIS — K219 Gastro-esophageal reflux disease without esophagitis: Secondary | ICD-10-CM | POA: Diagnosis present

## 2019-04-09 DIAGNOSIS — Z79899 Other long term (current) drug therapy: Secondary | ICD-10-CM

## 2019-04-09 DIAGNOSIS — I739 Peripheral vascular disease, unspecified: Secondary | ICD-10-CM | POA: Diagnosis present

## 2019-04-09 DIAGNOSIS — Z8673 Personal history of transient ischemic attack (TIA), and cerebral infarction without residual deficits: Secondary | ICD-10-CM

## 2019-04-09 DIAGNOSIS — E785 Hyperlipidemia, unspecified: Secondary | ICD-10-CM | POA: Diagnosis present

## 2019-04-09 DIAGNOSIS — Z882 Allergy status to sulfonamides status: Secondary | ICD-10-CM

## 2019-04-09 DIAGNOSIS — Z7982 Long term (current) use of aspirin: Secondary | ICD-10-CM

## 2019-04-09 LAB — COMPREHENSIVE METABOLIC PANEL
ALT: 13 U/L (ref 0–44)
AST: 17 U/L (ref 15–41)
Albumin: 3.3 g/dL — ABNORMAL LOW (ref 3.5–5.0)
Alkaline Phosphatase: 53 U/L (ref 38–126)
Anion gap: 10 (ref 5–15)
BUN: 38 mg/dL — ABNORMAL HIGH (ref 8–23)
CO2: 21 mmol/L — ABNORMAL LOW (ref 22–32)
Calcium: 8.7 mg/dL — ABNORMAL LOW (ref 8.9–10.3)
Chloride: 93 mmol/L — ABNORMAL LOW (ref 98–111)
Creatinine, Ser: 0.98 mg/dL (ref 0.44–1.00)
GFR calc Af Amer: >60 mL/min (ref >60)
GFR calc non Af Amer: 52 mL/min — ABNORMAL LOW (ref >60)
Glucose, Bld: 145 mg/dL — ABNORMAL HIGH (ref 70–99)
Potassium: 4.4 mmol/L (ref 3.5–5.1)
Sodium: 124 mmol/L — ABNORMAL LOW (ref 135–145)
Total Bilirubin: 0.9 mg/dL (ref 0.3–1.2)
Total Protein: 6.8 g/dL (ref 6.5–8.1)

## 2019-04-09 LAB — URINALYSIS, ROUTINE W REFLEX MICROSCOPIC
Bilirubin Urine: NEGATIVE
Glucose, UA: NEGATIVE mg/dL
Hgb urine dipstick: NEGATIVE
Ketones, ur: NEGATIVE mg/dL
Nitrite: NEGATIVE
Protein, ur: NEGATIVE mg/dL
Specific Gravity, Urine: 1.005 (ref 1.005–1.030)
pH: 6 (ref 5.0–8.0)

## 2019-04-09 LAB — BRAIN NATRIURETIC PEPTIDE: B Natriuretic Peptide: 294.4 pg/mL — ABNORMAL HIGH (ref 0.0–100.0)

## 2019-04-09 LAB — MAGNESIUM: Magnesium: 2 mg/dL (ref 1.7–2.4)

## 2019-04-09 LAB — CBC WITH DIFFERENTIAL/PLATELET
Abs Immature Granulocytes: 0.1 10*3/uL — ABNORMAL HIGH (ref 0.00–0.07)
Abs Immature Granulocytes: 0.05 10*3/uL (ref 0.00–0.07)
Basophils Absolute: 0 10*3/uL (ref 0.0–0.1)
Basophils Absolute: 0 10*3/uL (ref 0.0–0.1)
Basophils Relative: 0 %
Basophils Relative: 0 %
Eosinophils Absolute: 0.1 10*3/uL (ref 0.0–0.5)
Eosinophils Absolute: 0.1 10*3/uL (ref 0.0–0.5)
Eosinophils Relative: 1 %
Eosinophils Relative: 1 %
HCT: 31.2 % — ABNORMAL LOW (ref 36.0–46.0)
HCT: 31.8 % — ABNORMAL LOW (ref 36.0–46.0)
Hemoglobin: 9.9 g/dL — ABNORMAL LOW (ref 12.0–15.0)
Hemoglobin: 10.4 g/dL — ABNORMAL LOW (ref 12.0–15.0)
Immature Granulocytes: 1 %
Immature Granulocytes: 0 %
Lymphocytes Relative: 4 %
Lymphocytes Relative: 9 %
Lymphs Abs: 0.7 10*3/uL (ref 0.7–4.0)
Lymphs Abs: 1.2 10*3/uL (ref 0.7–4.0)
MCH: 31.6 pg (ref 26.0–34.0)
MCH: 32.2 pg (ref 26.0–34.0)
MCHC: 31.7 g/dL (ref 30.0–36.0)
MCHC: 32.7 g/dL (ref 30.0–36.0)
MCV: 99.7 fL (ref 80.0–100.0)
MCV: 98.5 fL (ref 80.0–100.0)
Monocytes Absolute: 1.2 10*3/uL — ABNORMAL HIGH (ref 0.1–1.0)
Monocytes Absolute: 0.5 10*3/uL (ref 0.1–1.0)
Monocytes Relative: 7 %
Monocytes Relative: 4 %
Neutro Abs: 16 10*3/uL — ABNORMAL HIGH (ref 1.7–7.7)
Neutro Abs: 11.8 10*3/uL — ABNORMAL HIGH (ref 1.7–7.7)
Neutrophils Relative %: 87 %
Neutrophils Relative %: 86 %
Platelets: 244 10*3/uL (ref 150–400)
Platelets: 255 10*3/uL (ref 150–400)
RBC: 3.13 MIL/uL — ABNORMAL LOW (ref 3.87–5.11)
RBC: 3.23 MIL/uL — ABNORMAL LOW (ref 3.87–5.11)
RDW: 13.1 % (ref 11.5–15.5)
RDW: 13.2 % (ref 11.5–15.5)
WBC: 18.2 10*3/uL — ABNORMAL HIGH (ref 4.0–10.5)
WBC: 13.7 10*3/uL — ABNORMAL HIGH (ref 4.0–10.5)
nRBC: 0 % (ref 0.0–0.2)
nRBC: 0 % (ref 0.0–0.2)

## 2019-04-09 LAB — HEMOGLOBIN A1C
Hgb A1c MFr Bld: 5.8 % — ABNORMAL HIGH (ref 4.8–5.6)
Mean Plasma Glucose: 119.76 mg/dL

## 2019-04-09 LAB — PROCALCITONIN: Procalcitonin: 2.12 ng/mL

## 2019-04-09 LAB — CALCIUM: Calcium: 8.7 mg/dL — ABNORMAL LOW (ref 8.9–10.3)

## 2019-04-09 LAB — PHOSPHORUS: Phosphorus: 3.3 mg/dL (ref 2.5–4.6)

## 2019-04-09 LAB — LACTIC ACID, PLASMA
Lactic Acid, Venous: 1.8 mmol/L (ref 0.5–1.9)
Lactic Acid, Venous: 1 mmol/L (ref 0.5–1.9)

## 2019-04-09 LAB — CREATININE, SERUM
Creatinine, Ser: 0.93 mg/dL (ref 0.44–1.00)
GFR calc Af Amer: >60 mL/min (ref >60)
GFR calc non Af Amer: 56 mL/min — ABNORMAL LOW (ref >60)

## 2019-04-09 LAB — CBG MONITORING, ED: Glucose-Capillary: 129 mg/dL — ABNORMAL HIGH (ref 70–99)

## 2019-04-09 LAB — OSMOLALITY: Osmolality: 275 mOsm/kg (ref 275–295)

## 2019-04-09 LAB — VITAMIN B12: Vitamin B-12: 450 pg/mL (ref 180–914)

## 2019-04-09 LAB — SARS CORONAVIRUS 2 BY RT PCR (HOSPITAL ORDER, PERFORMED IN ~~LOC~~ HOSPITAL LAB): SARS Coronavirus 2: NEGATIVE

## 2019-04-09 LAB — TSH: TSH: 0.739 u[IU]/mL (ref 0.350–4.500)

## 2019-04-09 LAB — TROPONIN I: Troponin I: <0.03 ng/mL (ref <0.03)

## 2019-04-09 MED ORDER — SODIUM CHLORIDE 0.9 % IV SOLN
500.0000 mg | INTRAVENOUS | Status: DC
  Administered 2019-04-10: 500 mg via INTRAVENOUS
  Filled 2019-04-09 (×2): qty 500

## 2019-04-09 MED ORDER — ONDANSETRON HCL 4 MG/2ML IJ SOLN: 4.0000 mg | Freq: Four times a day (QID) | INTRAMUSCULAR | Status: DC | PRN

## 2019-04-09 MED ORDER — SODIUM CHLORIDE 0.9 % IV SOLN
INTRAVENOUS | Status: DC
  Administered 2019-04-09 – 2019-04-10 (×2): via INTRAVENOUS

## 2019-04-09 MED ORDER — TRAZODONE HCL 50 MG PO TABS: 50.0000 mg | ORAL_TABLET | Freq: Every evening | ORAL | Status: DC | PRN

## 2019-04-09 MED ORDER — HYDROCODONE-ACETAMINOPHEN 5-325 MG PO TABS
1.0000 | ORAL_TABLET | ORAL | Status: DC | PRN
  Administered 2019-04-10 – 2019-04-12 (×2): 1 via ORAL
  Filled 2019-04-09 (×2): qty 1

## 2019-04-09 MED ORDER — HEPARIN SODIUM (PORCINE) 5000 UNIT/ML IJ SOLN: 5000.0000 [IU] | Freq: Three times a day (TID) | INTRAMUSCULAR | Status: DC

## 2019-04-09 MED ORDER — DOCUSATE SODIUM 100 MG PO CAPS
100.0000 mg | ORAL_CAPSULE | Freq: Two times a day (BID) | ORAL | Status: DC
  Administered 2019-04-10 – 2019-04-12 (×4): 100 mg via ORAL
  Filled 2019-04-09 (×6): qty 1

## 2019-04-09 MED ORDER — SODIUM CHLORIDE 0.9 % IV SOLN
INTRAVENOUS | Status: DC
  Administered 2019-04-09: 16:00:00 via INTRAVENOUS

## 2019-04-09 MED ORDER — ACETAMINOPHEN 325 MG PO TABS
650.0000 mg | ORAL_TABLET | Freq: Four times a day (QID) | ORAL | Status: DC | PRN
  Administered 2019-04-09 – 2019-04-11 (×5): 650 mg via ORAL
  Filled 2019-04-09 (×5): qty 2

## 2019-04-09 MED ORDER — LEVALBUTEROL HCL 0.63 MG/3ML IN NEBU: 0.6300 mg | INHALATION_SOLUTION | Freq: Four times a day (QID) | RESPIRATORY_TRACT | Status: DC | PRN

## 2019-04-09 MED ORDER — GUAIFENESIN-DM 100-10 MG/5ML PO SYRP
10.0000 mL | ORAL_SOLUTION | Freq: Four times a day (QID) | ORAL | Status: DC
  Administered 2019-04-09 – 2019-04-12 (×12): 10 mL via ORAL
  Filled 2019-04-09 (×12): qty 10

## 2019-04-09 MED ORDER — SODIUM CHLORIDE 0.9 % IV SOLN
1.0000 g | Freq: Once | INTRAVENOUS | Status: AC
  Administered 2019-04-09: 1 g via INTRAVENOUS
  Filled 2019-04-09: qty 10

## 2019-04-09 MED ORDER — SODIUM CHLORIDE 0.9 % IV SOLN
1.0000 g | INTRAVENOUS | Status: DC
  Administered 2019-04-10 – 2019-04-11 (×2): 1 g via INTRAVENOUS
  Filled 2019-04-09: qty 10
  Filled 2019-04-09 (×2): qty 1

## 2019-04-09 MED ORDER — MAGNESIUM CITRATE PO SOLN: 1.0000 | Freq: Once | ORAL | Status: DC | PRN

## 2019-04-09 MED ORDER — HEPARIN SODIUM (PORCINE) 5000 UNIT/ML IJ SOLN
5000.0000 [IU] | Freq: Three times a day (TID) | INTRAMUSCULAR | Status: DC
  Administered 2019-04-09 – 2019-04-12 (×8): 5000 [IU] via SUBCUTANEOUS
  Filled 2019-04-09 (×8): qty 1

## 2019-04-09 MED ORDER — ACETAMINOPHEN 650 MG RE SUPP: 650.0000 mg | Freq: Four times a day (QID) | RECTAL | Status: DC | PRN

## 2019-04-09 MED ORDER — SORBITOL 70 % SOLN: 30.0000 mL | Freq: Every day | Status: DC | PRN

## 2019-04-09 MED ORDER — SENNOSIDES-DOCUSATE SODIUM 8.6-50 MG PO TABS: 1.0000 | ORAL_TABLET | Freq: Every evening | ORAL | Status: DC | PRN

## 2019-04-09 MED ORDER — SODIUM CHLORIDE 0.9 % IV BOLUS
1000.0000 mL | Freq: Once | INTRAVENOUS | Status: AC
  Administered 2019-04-09: 1000 mL via INTRAVENOUS

## 2019-04-09 MED ORDER — ASPIRIN EC 81 MG PO TBEC
81.0000 mg | DELAYED_RELEASE_TABLET | Freq: Every day | ORAL | Status: DC
  Administered 2019-04-10 – 2019-04-12 (×3): 81 mg via ORAL
  Filled 2019-04-09 (×4): qty 1

## 2019-04-09 MED ORDER — ALBUTEROL SULFATE (2.5 MG/3ML) 0.083% IN NEBU: 2.5000 mg | INHALATION_SOLUTION | RESPIRATORY_TRACT | Status: DC | PRN

## 2019-04-09 MED ORDER — ORAL CARE MOUTH RINSE
15.0000 mL | Freq: Two times a day (BID) | OROMUCOSAL | Status: DC
  Administered 2019-04-10 – 2019-04-12 (×4): 15 mL via OROMUCOSAL

## 2019-04-09 MED ORDER — SODIUM CHLORIDE 0.9 % IV SOLN
500.0000 mg | Freq: Once | INTRAVENOUS | Status: AC
  Administered 2019-04-09: 500 mg via INTRAVENOUS
  Filled 2019-04-09: qty 500

## 2019-04-09 MED ORDER — ONDANSETRON HCL 4 MG PO TABS: 4.0000 mg | ORAL_TABLET | Freq: Four times a day (QID) | ORAL | Status: DC | PRN

## 2019-04-09 MED ORDER — KETOROLAC TROMETHAMINE 15 MG/ML IJ SOLN
15.0000 mg | Freq: Four times a day (QID) | INTRAMUSCULAR | Status: DC | PRN
  Administered 2019-04-10 – 2019-04-12 (×2): 15 mg via INTRAVENOUS
  Filled 2019-04-09 (×2): qty 1

## 2019-04-09 MED ORDER — PRAVASTATIN SODIUM 20 MG PO TABS
40.0000 mg | ORAL_TABLET | Freq: Every day | ORAL | Status: DC
  Administered 2019-04-09 – 2019-04-11 (×3): 40 mg via ORAL
  Filled 2019-04-09 (×3): qty 2

## 2019-04-09 NOTE — ED Triage Notes (Signed)
Pt BIBA from [facility].  Pt reports syncopal episode yesterday, when she awoke, she had vomit on herself.  Had another syncopal episode this AM.  Pt reports diarrhea x3 today.  Pt reports productive cough.    Aox4.  400 mL NaCl given by EMS

## 2019-04-09 NOTE — H&P (Signed)
History and Physical   Patient: Jill Hernandez                            PCP: Lovenia KimHepler, Mark, PA-C                    DOB: 01-21-1930            DOA: 04/09/2019 [id number]             DOS: 04/09/2019, 4:52 PM  Patient coming from:  ALF I have personally reviewed patient's medical records, in electronic medical records, including: [place] link, and care everywhere.    Chief Complaint:   Chief Complaint  Patient presents with  . Nausea  . Emesis  . Diarrhea    History of present illness:    Jill Hernandez is a 85 y.o. female with medical history significant of hypertension, hyperlipidemia, TIAs, GERD, presenting with episodes of possible syncope yesterday and this morning, associated with some nausea vomiting.  3 episodes of nonbloody diarrhea today.  Patient also reports of cough which has been productive with yellowish sputum.  But denied having any fever chest pain or shortness of breath. Currently resident has been living, reports of no covert cases at the facility, she is not hypoxic, does not have any other sinus symptoms of upper respiratory infection.  she stable denies of having any fever, chills.  Improved nausea vomiting, denies of having any chest pain or shortness of breath.  Reports of just cough.  Has been sometimes productive with yellowish sputum.  Denies any abdominal pain constipation, few episodes of diarrhea today.  Denies of having any headaches visual changes or asymmetric weaknesses.  Denies any joint pain.  Hard of hearing.  Denies any open wounds or rash.  ED Course:   Patient was evaluated in ED, temp 98.1, pulse 83, respiratory rate 24, blood pressure 101/61 WBC 18.2, sodium 124 Chest x-ray -consistent with possible bilateral pneumonia Urine reveals many bacteria, leukocyte Estrace SARS-CoV-2 target nucleic acids are NOT DETECTED She has been put on IV antibiotics of Rocephin and azithromycin, blood cultures been sent  Review of Systems: As per  HPI otherwise 12 point review of systems negative.    Assessment / Plan:   Principal Problem:   PNA (pneumonia) -Patient will be admitted to telemetry bed -Ruling out sepsis -Currently afebrile, leukocytosis, hypotensive, lactic acid 1.8 -Patient is from assisted living, No COVID-19 cases or exposure -SARS-CoV-2 target nucleic acids are NOT DETECTED -Blood culture has been obtained -Patient has been initiated on IV antibiotics of azithromycin Rocephin will be continued -Follow-up with labs, procalcitonin level, blood sputum cultures   Asymptomatic bacteriuria -Urine positive for leukocytes, bacteria, cultures will be sent -Antibiotic should cover for any brewing urinary infection    Syncope -Likely due to dehydration, hypotension, -Continue with neurochecks, orthostatic blood pressure monitoring -If no improvement or another episode will pursue with a CT of the head, carotid ultrasound  -We will monitor closely for dysrhythmias  Hypotension/with a history of HTN (hypertension) -Holding home pressure medication including Norvasc, Coreg, and lisinopril  Hydration -We will continue with gentle IV fluid hydration     HLD (hyperlipidemia) -Continue statin    Hyponatremia -Likely pre-azotemia, due to dehydration -Nevertheless will order SIADH work-up -Continue gentle IV fluid hydration with normal saline     Small vessel disease, cerebrovascular -New home dose aspirin -Continue to monitor -Pt/Ot   Chest x-ray: IMPRESSION: 1. Hazy  infrahilar opacities bilaterally. Possibilities include atelectasis, aspiration pneumonitis, or atypical infectious process. Pulmonary edema is a less likely differential diagnostic consideration given the lack of interstitial accentuation or pulmonary vascular findings. 2.  Aortic Atherosclerosis (ICD10-I70.0). 3. Mild enlargement of the cardiopericardial silhouette. 4. Bony demineralization.  DVT prophylaxis: Heparin SQ  Code Status:    Code Status: Prior  Family Communication:  The above findings and plan of care has been discussed with patient and family in detail, they expressed understanding and agreement of above plan.   Disposition Plan: >3 days  Consults called:  None  Admission status: Patient will be admitted as Inpatient, with a greater than 2 midnight length of stay.  She meets the criteria for inpatient, as she is currently hypotensive, likely pneumonia/UTI, and syncope X2.    ----------------------------------------------------------------------------------------------------------------------  Allergies  Allergen Reactions  . Sulfa Antibiotics Hives    Home MEDs:  Prior to Admission medications   Medication Sig Start Date End Date Taking? Authorizing Provider  acetaminophen (TYLENOL) 500 MG tablet Take 1,000 mg by mouth 2 (two) times daily.   Yes [provider]  aspirin 81 MG chewable tablet Chew 81 mg by mouth daily at 3 pm.    Yes [provider]  carvedilol (COREG) 12.5 MG tablet Take 12.5-25 mg by mouth See admin instructions. Take 2 tablets in the AM and 1 tablet in the PM. 02/07/19  Yes [provider]  lisinopril (PRINIVIL,ZESTRIL) 20 MG tablet Take 20 mg by mouth at bedtime.    Yes [provider]  lovastatin (MEVACOR) 20 MG tablet Take 20 mg by mouth daily with supper.    Yes [provider]  omeprazole (PRILOSEC) 40 MG capsule Take 40 mg by mouth daily. 03/01/19  Yes [provider]  amLODipine (NORVASC) 2.5 MG tablet Take 2.5 mg by mouth daily.    [provider]    PRN MEDs:   Past Medical History:  Diagnosis Date  . GERD (gastroesophageal reflux disease)   . Hyperlipemia   . Hypertension   . TIA (transient ischemic attack)     Past Surgical History:  Procedure Laterality Date  . ABDOMINAL HYSTERECTOMY    . TONSILLECTOMY       reports that she quit smoking about 55 years ago. Her smoking use included cigarettes.  She quit after 20.00 years of use. She has never used smokeless tobacco. She reports that she does not drink alcohol or use drugs.   Family History  Problem Relation Age of Onset  . Hypertension Mother   . Hyperlipidemia Father   . Hypertension Father     Physical Exam:   Vitals:   04/09/19 1346 04/09/19 1354 04/09/19 1400  BP:   101/61  Pulse:  73 83  Resp:   (!) 24  Temp:  98.1 F (36.7 C)   TempSrc:  Oral   SpO2: 98% 98% 97%    Constitutional: NAD, calm, comfortable Vitals:   04/09/19 1346 04/09/19 1354 04/09/19 1400  BP:   101/61  Pulse:  73 83  Resp:   (!) 24  Temp:  98.1 F (36.7 C)   TempSrc:  Oral   SpO2: 98% 98% 97%   Eyes: PERRL, lids and conjunctivae normal ENMT: Oral of hearing, mucous membranes are moist. Posterior pharynx clear of any exudate or lesions.Normal dentition.  Neck: normal, supple, no masses, no thyromegaly Respiratory: clear to auscultation bilaterally, no wheezing, no crackles. Normal respiratory effort. No accessory muscle use.  Cardiovascular: Regular rate and rhythm,  no murmurs / rubs / gallops. No extremity edema. 2+ pedal pulses. No carotid bruits.  Abdomen: no tenderness, no masses palpated. No hepatosplenomegaly. Bowel sounds positive.  Musculoskeletal: no clubbing / cyanosis. No joint deformity upper and lower extremities. Good ROM, no contractures. Normal muscle tone.  Neurologic: CN II-XII grossly intact. Sensation intact, DTR normal. Strength 5/5 in all 4.  Psychiatric: Normal judgment and insight. Alert and oriented x 3. Normal mood.  Skin: no rashes, lesions, ulcers. No induration Decubitus/ulcers: None observed, Urinary catheter: Chronic indwelling/was placed in this admission  Labs on admission:    I have personally reviewed following labs and imaging studies  CBC: Recent Labs  Lab 04/09/19 1439  WBC 18.2*  NEUTROABS 16.0*  HGB 9.9*  HCT 31.2*  MCV 99.7  PLT 244   Basic Metabolic Panel: Recent Labs  Lab  04/09/19 1439  NA 124*  K 4.4  CL 93*  CO2 21*  GLUCOSE 145*  BUN 38*  CREATININE 0.98  CALCIUM 8.7*   GFR: CrCl cannot be calculated (Unknown ideal weight.). Liver Function Tests: Recent Labs  Lab 04/09/19 1439  AST 17  ALT 13  ALKPHOS 53  BILITOT 0.9  PROT 6.8  ALBUMIN 3.3*    Cardiac Enzymes: Recent Labs  Lab 04/09/19 1439  TROPONINI <0.03    CBG: Recent Labs  Lab 04/09/19 1354  GLUCAP 129*   Urine analysis:    Component Value Date/Time   COLORURINE YELLOW 04/09/2019 1439   APPEARANCEUR HAZY (A) 04/09/2019 1439   LABSPEC 1.005 04/09/2019 1439   PHURINE 6.0 04/09/2019 1439   GLUCOSEU NEGATIVE 04/09/2019 1439   HGBUR NEGATIVE 04/09/2019 1439   BILIRUBINUR NEGATIVE 04/09/2019 1439   KETONESUR NEGATIVE 04/09/2019 1439   PROTEINUR NEGATIVE 04/09/2019 1439   UROBILINOGEN 0.2 04/29/2014 1514   NITRITE NEGATIVE 04/09/2019 1439   LEUKOCYTESUR MODERATE (A) 04/09/2019 1439     Radiologic Exams on Admission:   Dg Chest Port 1 View  Result Date: 04/09/2019 CLINICAL DATA:  Syncope.  Vomiting.  Productive cough. EXAM: PORTABLE CHEST 1 VIEW COMPARISON:  04/29/2014 FINDINGS: Atherosclerotic calcification of the aortic arch. Mild enlargement of the cardiopericardial silhouette. Indistinct bilateral infrahilar hazy opacity. The lungs appear otherwise clear. Bony demineralization.  Lower thoracic spondylosis. IMPRESSION: 1. Hazy infrahilar opacities bilaterally. Possibilities include atelectasis, aspiration pneumonitis, or atypical infectious process. Pulmonary edema is a less likely differential diagnostic consideration given the lack of interstitial accentuation or pulmonary vascular findings. 2.  Aortic Atherosclerosis (ICD10-I70.0). 3. Mild enlargement of the cardiopericardial silhouette. 4. Bony demineralization. Electronically Signed   By: Gaylyn Rong M.D.   On: 04/09/2019 14:26    EKG:   Independently reviewed.   Orders placed or performed during the  hospital encounter of 04/09/19  . EKG 12-Lead  . EKG 12-Lead  . EKG 12-Lead  . EKG 12-Lead     Time spent: > than  55  Min.   Kendell Bane MD [facility] ,  Pager [phone]  If 7PM-7AM, please contact night-coverage [email or web address]  Password [facility] 04/09/2019, 4:52 PM

## 2019-04-09 NOTE — ED Notes (Signed)
Bed: WA09 Expected date:  Expected time:  Means of arrival:  Comments: EMS 85yo weakness, N/V/D

## 2019-04-09 NOTE — ED Notes (Signed)
ED TO INPATIENT HANDOFF REPORT  ED Nurse Name and Phone #: [phone]  S Name/Age/Gender Jill Hernandez 85 y.o. female Room/Bed: WA09/WA09  Code Status   Code Status: Full Code  Home/SNF/Other ALF Patient oriented to: self, place, time and situation Is this baseline? Yes   Triage Complete: Triage complete  Chief Complaint nausea vomiting diarrhea  Triage Note Pt BIBA from [facility].  Pt reports syncopal episode yesterday, when she awoke, she had vomit on herself.  Had another syncopal episode this AM.  Pt reports diarrhea x3 today.  Pt reports productive cough.    Aox4.  400 mL NaCl given by EMS   Allergies Allergies  Allergen Reactions  . Sulfa Antibiotics Hives    Level of Care/Admitting Diagnosis ED Disposition    ED Disposition Condition Comment   Admit  Hospital Area: [facility] [[id number]]  Level of Care: Telemetry [5]  Admit to tele based on following criteria: Monitor QTC interval  Admit to tele based on following criteria: Eval of Syncope  Covid Evaluation: Screening Protocol (No Symptoms)  Diagnosis: PNA (pneumonia) [[id number]]  Admitting Physician: Felipa Furnace  Attending Physician: Felipa Furnace  Estimated length of stay: 3 - 4 days  Certification:: I certify this patient will need inpatient services for at least 2 midnights  PT Class (Do Not Modify): Inpatient [101]  PT Acc Code (Do Not Modify): Private [1]       B Medical/Surgery History Past Medical History:  Diagnosis Date  . GERD (gastroesophageal reflux disease)   . Hyperlipemia   . Hypertension   . TIA (transient ischemic attack)    Past Surgical History:  Procedure Laterality Date  . ABDOMINAL HYSTERECTOMY    . TONSILLECTOMY       A IV Location/Drains/Wounds Patient Lines/Drains/Airways Status   Active Line/Drains/Airways    Name:   Placement date:   Placement time:   Site:   Days:   Peripheral IV Left Antecubital   -    -     Antecubital      Peripheral IV 04/09/19 Left Forearm   04/09/19    1534    Forearm   less than 1          Intake/Output Last 24 hours  Intake/Output Summary (Last 24 hours) at 04/09/2019 1726 Last data filed at 04/09/2019 1657 Gross per 24 hour  Intake 1100 ml  Output -  Net 1100 ml    Labs/Imaging Results for orders placed or performed during the hospital encounter of 04/09/19 (from the past 48 hour(s))  CBG monitoring, ED     Status: Abnormal   Collection Time: 04/09/19  1:54 PM  Result Value Ref Range   Glucose-Capillary 129 (H) 70 - 99 mg/dL  CBC WITH DIFFERENTIAL     Status: Abnormal   Collection Time: 04/09/19  2:39 PM  Result Value Ref Range   WBC 18.2 (H) 4.0 - 10.5 K/uL    Comment: WHITE COUNT CONFIRMED ON SMEAR   RBC 3.13 (L) 3.87 - 5.11 MIL/uL   Hemoglobin 9.9 (L) 12.0 - 15.0 g/dL   HCT 35.4 (L) 65.6 - 81.2 %   MCV 99.7 80.0 - 100.0 fL   MCH 31.6 26.0 - 34.0 pg   MCHC 31.7 30.0 - 36.0 g/dL   RDW 75.1 70.0 - 17.4 %   Platelets 244 150 - 400 K/uL   nRBC 0.0 0.0 - 0.2 %   Neutrophils Relative % 87 %   Neutro  Abs 16.0 (H) 1.7 - 7.7 K/uL   Lymphocytes Relative 4 %   Lymphs Abs 0.7 0.7 - 4.0 K/uL   Monocytes Relative 7 %   Monocytes Absolute 1.2 (H) 0.1 - 1.0 K/uL   Eosinophils Relative 1 %   Eosinophils Absolute 0.1 0.0 - 0.5 K/uL   Basophils Relative 0 %   Basophils Absolute 0.0 0.0 - 0.1 K/uL   Immature Granulocytes 1 %   Abs Immature Granulocytes 0.10 (H) 0.00 - 0.07 K/uL   Burr Cells PRESENT     Comment: Performed at [facility], [address], [place], [place] [address]  Comprehensive metabolic panel     Status: Abnormal   Collection Time: 04/09/19  2:39 PM  Result Value Ref Range   Sodium 124 (L) 135 - 145 mmol/L   Potassium 4.4 3.5 - 5.1 mmol/L   Chloride 93 (L) 98 - 111 mmol/L   CO2 21 (L) 22 - 32 mmol/L   Glucose, Bld 145 (H) 70 - 99 mg/dL   BUN 38 (H) 8 - 23 mg/dL   Creatinine, Ser 6.04 0.44 - 1.00 mg/dL   Calcium 8.7 (L)  8.9 - 10.3 mg/dL   Total Protein 6.8 6.5 - 8.1 g/dL   Albumin 3.3 (L) 3.5 - 5.0 g/dL   AST 17 15 - 41 U/L   ALT 13 0 - 44 U/L   Alkaline Phosphatase 53 38 - 126 U/L   Total Bilirubin 0.9 0.3 - 1.2 mg/dL   GFR calc non Af Amer 52 (L) >60 mL/min   GFR calc Af Amer >60 >60 mL/min   Anion gap 10 5 - 15    Comment: Performed at [facility], [address], [place], [place] [address]  Urinalysis, Routine w reflex microscopic     Status: Abnormal   Collection Time: 04/09/19  2:39 PM  Result Value Ref Range   Color, Urine YELLOW YELLOW   APPearance HAZY (A) CLEAR   Specific Gravity, Urine 1.005 1.005 - 1.030   pH 6.0 5.0 - 8.0   Glucose, UA NEGATIVE NEGATIVE mg/dL   Hgb urine dipstick NEGATIVE NEGATIVE   Bilirubin Urine NEGATIVE NEGATIVE   Ketones, ur NEGATIVE NEGATIVE mg/dL   Protein, ur NEGATIVE NEGATIVE mg/dL   Nitrite NEGATIVE NEGATIVE   Leukocytes,Ua MODERATE (A) NEGATIVE   RBC / HPF 0-5 0 - 5 RBC/hpf   WBC, UA 6-10 0 - 5 WBC/hpf   Bacteria, UA MANY (A) NONE SEEN   Squamous Epithelial / LPF 0-5 0 - 5    Comment: Performed at [facility], [address], [place], [place] [address]  Troponin I - ONCE - STAT     Status: None   Collection Time: 04/09/19  2:39 PM  Result Value Ref Range   Troponin I <0.03 <0.03 ng/mL    Comment: Performed at [facility], [address], [place], [place] [address]  SARS Coronavirus 2 (CEPHEID- Performed in [facility] hospital lab), Hosp Order     Status: None   Collection Time: 04/09/19  2:40 PM  Result Value Ref Range   SARS Coronavirus 2 NEGATIVE NEGATIVE    Comment: (NOTE) If result is NEGATIVE SARS-CoV-2 target nucleic acids are NOT DETECTED. The SARS-CoV-2 RNA is generally detectable in upper and lower  respiratory specimens during the acute phase of infection. The lowest  concentration of SARS-CoV-2 viral copies this assay can detect is 250  copies / mL. A negative result does not  preclude SARS-CoV-2 infection  and should not be used as the sole basis for treatment or other  patient management decisions.  A negative result may occur with  improper specimen collection / handling, submission of specimen other  than nasopharyngeal swab, presence of viral mutation(s) within the  areas targeted by this assay, and inadequate number of viral copies  (<250 copies / mL). A negative result must be combined with clinical  observations, patient history, and epidemiological information. If result is POSITIVE SARS-CoV-2 target nucleic acids are DETECTED. The SARS-CoV-2 RNA is generally detectable in upper and lower  respiratory specimens dur ing the acute phase of infection.  Positive  results are indicative of active infection with SARS-CoV-2.  Clinical  correlation with patient history and other diagnostic information is  necessary to determine patient infection status.  Positive results do  not rule out bacterial infection or co-infection with other viruses. If result is PRESUMPTIVE POSTIVE SARS-CoV-2 nucleic acids MAY BE PRESENT.   A presumptive positive result was obtained on the submitted specimen  and confirmed on repeat testing.  While 2019 novel coronavirus  (SARS-CoV-2) nucleic acids may be present in the submitted sample  additional confirmatory testing may be necessary for epidemiological  and / or clinical management purposes  to differentiate between  SARS-CoV-2 and other Sarbecovirus currently known to infect humans.  If clinically indicated additional testing with an alternate test  methodology [phone]([id number]) is advised. The SARS-CoV-2 RNA is generally  detectable in upper and lower respiratory sp ecimens during the acute  phase of infection. The expected result is Negative. Fact Sheet for Patients:  BoilerBrush.com.cyhttps://www.fda.gov/media/136312/download Fact Sheet for Healthcare Providers: https://pope.com/https://www.fda.gov/media/136313/download This test is not yet approved or cleared by  the [place] FDA and has been authorized for detection and/or diagnosis of SARS-CoV-2 by FDA under an Emergency Use Authorization (EUA).  This EUA will remain in effect (meaning this test can be used) for the duration of the COVID-19 declaration under Section 564(b)(1) of the Act, 21 U.S.C. section 360bbb-3(b)(1), unless the authorization is terminated or revoked sooner. Performed at [facility], [address] [place], [place] [address]   Lactic acid, plasma     Status: None   Collection Time: 04/09/19  3:11 PM  Result Value Ref Range   Lactic Acid, Venous 1.8 0.5 - 1.9 mmol/L    Comment: Performed at [facility], [address] [place], [place] [address]  Lactic acid, plasma     Status: None   Collection Time: 04/09/19  4:50 PM  Result Value Ref Range   Lactic Acid, Venous 1.0 0.5 - 1.9 mmol/L    Comment: Performed at [facility], [address] [place], [place] [address]   Dg Chest Port 1 View  Result Date: 04/09/2019 CLINICAL DATA:  Syncope.  Vomiting.  Productive cough. EXAM: PORTABLE CHEST 1 VIEW COMPARISON:  04/29/2014 FINDINGS: Atherosclerotic calcification of the aortic arch. Mild enlargement of the cardiopericardial silhouette. Indistinct bilateral infrahilar hazy opacity. The lungs appear otherwise clear. Bony demineralization.  Lower thoracic spondylosis. IMPRESSION: 1. Hazy infrahilar opacities bilaterally. Possibilities include atelectasis, aspiration pneumonitis, or atypical infectious process. Pulmonary edema is a less likely differential diagnostic consideration given the lack of interstitial accentuation or pulmonary vascular findings. 2.  Aortic Atherosclerosis (ICD10-I70.0). 3. Mild enlargement of the cardiopericardial silhouette. 4. Bony demineralization. Electronically Signed   By: Gaylyn RongWalter  Liebkemann M.D.   On: 04/09/2019 14:26    Pending Labs Unresulted Labs (From admission, onward)  Start      Ordered   04/10/19 0500  CBC  Daily,   R     04/09/19 1718   04/10/19 0500  Protime-INR  Tomorrow morning,   R     04/09/19 1718   04/10/19 0500  APTT  Tomorrow morning,   R     04/09/19 1718   04/10/19 0500  Basic metabolic panel  Daily,   R     04/09/19 1718   04/10/19 0500  CBC  Daily,   R     04/09/19 1718   04/09/19 1718  CBC  (heparin)  Once,   R    Comments:  Baseline for heparin therapy IF NOT ALREADY DRAWN.  Notify MD if PLT < 100 K.    04/09/19 1718   04/09/19 1718  Creatinine, serum  (heparin)  Once,   R    Comments:  Baseline for heparin therapy IF NOT ALREADY DRAWN.    04/09/19 1718   04/09/19 1718  Calcium  Once,   R     04/09/19 1718   04/09/19 1718  Magnesium  Once,   R     04/09/19 1718   04/09/19 1718  Phosphorus  Once,   R     04/09/19 1718   04/09/19 1718  Culture, sputum-assessment  Once,   R     04/09/19 1718   04/09/19 1718  Brain natriuretic peptide  Once,   R     04/09/19 1718   04/09/19 1718  TSH  Once,   R     04/09/19 1718   04/09/19 1718  Hemoglobin A1c  Once,   R     04/09/19 1718   04/09/19 1718  Urinalysis, Routine w reflex microscopic  Once,   R     04/09/19 1718   04/09/19 1718  CBC WITH DIFFERENTIAL  Once,   R     04/09/19 1718   04/09/19 1718  Urine culture  Once,   R     04/09/19 1718   04/09/19 1717  Culture, Urine  Once,   R     04/09/19 1716   04/09/19 1715  Osmolality  Once,   R     04/09/19 1714   04/09/19 1715  Na and K (sodium & potassium), rand urine  Once,   R     04/09/19 1714   04/09/19 1715  Vitamin B12  Once,   R     04/09/19 1714   04/09/19 1714  Osmolality, urine  Once,   R     04/09/19 1714   04/09/19 1650  Procalcitonin - Baseline  ONCE - STAT,   STAT     04/09/19 1649   04/09/19 1441  Culture, blood (routine x 2)  BLOOD CULTURE X 2,   STAT     04/09/19 1441          Vitals/Pain Today's Vitals   04/09/19 1346 04/09/19 1354 04/09/19 1400 04/09/19 1656  BP:   101/61   Pulse:  73 83   Resp:   (!) 24    Temp:  98.1 F (36.7 C)    TempSrc:  Oral    SpO2: 98% 98% 97%   PainSc:  0-No pain  0-No pain    Isolation Precautions Droplet and Contact precautions  Medications Medications  sodium chloride 0.9 % bolus 1,000 mL (0 mLs Intravenous Stopped 04/09/19 1657)    And  0.9 %  sodium chloride infusion ( Intravenous New Bag/Given  04/09/19 1535)  azithromycin (ZITHROMAX) 500 mg in sodium chloride 0.9 % 250 mL IVPB (has no administration in time range)  cefTRIAXone (ROCEPHIN) 1 g in sodium chloride 0.9 % 100 mL IVPB (has no administration in time range)  pravastatin (PRAVACHOL) tablet 40 mg (has no administration in time range)  heparin injection 5,000 Units (has no administration in time range)  0.9 %  sodium chloride infusion (has no administration in time range)  acetaminophen (TYLENOL) tablet 650 mg (has no administration in time range)    Or  acetaminophen (TYLENOL) suppository 650 mg (has no administration in time range)  HYDROcodone-acetaminophen (NORCO/VICODIN) 5-325 MG per tablet 1-2 tablet (has no administration in time range)  ketorolac (TORADOL) 15 MG/ML injection 15 mg (has no administration in time range)  traZODone (DESYREL) tablet 50 mg (has no administration in time range)  docusate sodium (COLACE) capsule 100 mg (has no administration in time range)  senna-docusate (Senokot-S) tablet 1 tablet (has no administration in time range)  sorbitol 70 % solution 30 mL (has no administration in time range)  magnesium citrate solution 1 Bottle (has no administration in time range)  ondansetron (ZOFRAN) tablet 4 mg (has no administration in time range)    Or  ondansetron (ZOFRAN) injection 4 mg (has no administration in time range)  albuterol (PROVENTIL) (2.5 MG/3ML) 0.083% nebulizer solution 2.5 mg (has no administration in time range)  levalbuterol (XOPENEX) nebulizer solution 0.63 mg (has no administration in time range)  aspirin EC tablet 81 mg (has no administration in time  range)  guaiFENesin-dextromethorphan (ROBITUSSIN DM) 100-10 MG/5ML syrup 10 mL (has no administration in time range)  cefTRIAXone (ROCEPHIN) 1 g in sodium chloride 0.9 % 100 mL IVPB (0 g Intravenous Stopped 04/09/19 1657)  azithromycin (ZITHROMAX) 500 mg in sodium chloride 0.9 % 250 mL IVPB (500 mg Intravenous New Bag/Given 04/09/19 1534)    Mobility walks with device Low fall risk   Focused Assessments Neuro Assessment Handoff:  Swallow screen pass? n/a         Neuro Assessment: Exceptions to WDL Neuro Checks:      Last Documented NIHSS Modified Score:   Has TPA been given? No If patient is a Neuro Trauma and patient is going to OR before floor call report to 4N Charge nurse: [phone] or [phone]     R Recommendations: See Admitting Provider Note  Report given to:   Additional Notes:

## 2019-04-09 NOTE — ED Provider Notes (Signed)
[facility]-EMERGENCY DEPT Provider Note   CSN: [id number] Arrival date & time: 04/09/19  1336    History   Chief Complaint Chief Complaint  Patient presents with  . Nausea  . Emesis  . Diarrhea    HPI Jill Hernandez is a 85 y.o. female.     Pt presents to the ED today with a syncopal episode that occurred yesterday and today.  Pt was sitting on the couch and passed out.  When she awoke, she had vomit on herself.  She does not feel nauseous now.  She has had 3 episodes of diarrhea today.  She had another syncopal episode this morning.  She has had a cough with yellowish sputum, but no fever.  She denies cp or sob.  She does have a mild headache.  She lives at assisted living, but no one there has known Covid.      Past Medical History:  Diagnosis Date  . GERD (gastroesophageal reflux disease)   . Hyperlipemia   . Hypertension   . TIA (transient ischemic attack)     Patient Active Problem List   Diagnosis Date Noted  . Small vessel disease, cerebrovascular 02/28/2016  . Cerebral atrophy (HCC) 02/28/2016  . Hyponatremia 02/27/2016  . Syncope 04/29/2014  . TIA (transient ischemic attack) 04/29/2014  . HTN (hypertension) 04/29/2014  . HLD (hyperlipidemia) 04/29/2014  . Left knee pain 04/29/2014    Past Surgical History:  Procedure Laterality Date  . ABDOMINAL HYSTERECTOMY    . TONSILLECTOMY       OB History   No obstetric history on file.      Home Medications    Prior to Admission medications   Medication Sig Start Date End Date Taking? Authorizing Provider  acetaminophen (TYLENOL) 500 MG tablet Take 1,000 mg by mouth 2 (two) times daily.   Yes [provider]  aspirin 81 MG chewable tablet Chew 81 mg by mouth daily at 3 pm.    Yes [provider]  carvedilol (COREG) 12.5 MG tablet Take 12.5-25 mg by mouth See admin instructions. Take 2 tablets in the AM and 1 tablet in the PM. 02/07/19  Yes [provider]   lisinopril (PRINIVIL,ZESTRIL) 20 MG tablet Take 20 mg by mouth at bedtime.    Yes [provider]  lovastatin (MEVACOR) 20 MG tablet Take 20 mg by mouth daily with supper.    Yes [provider]  omeprazole (PRILOSEC) 40 MG capsule Take 40 mg by mouth daily. 03/01/19  Yes [provider]  amLODipine (NORVASC) 2.5 MG tablet Take 2.5 mg by mouth daily.    [provider]    Family History Family History  Problem Relation Age of Onset  . Hypertension Mother   . Hyperlipidemia Father   . Hypertension Father     Social History Social History   Tobacco Use  . Smoking status: Former Smoker    Years: 20.00    Types: Cigarettes    Last attempt to quit: 04/30/1963    Years since quitting: 55.9  . Smokeless tobacco: Never Used  Substance Use Topics  . Alcohol use: No  . Drug use: No     Allergies   Sulfa antibiotics   Review of Systems Review of Systems  Respiratory: Positive for cough.   Gastrointestinal: Positive for diarrhea, nausea and vomiting.  Neurological: Positive for syncope and headaches.  All other systems reviewed and are negative.    Physical Exam Updated Vital Signs BP 101/61  Pulse 83   Temp 98.1 F (36.7 C) (Oral)   Resp (!) 24   SpO2 97%   Physical Exam Vitals signs and nursing note reviewed.  Constitutional:      Appearance: Normal appearance.  HENT:     Head: Normocephalic and atraumatic.     Right Ear: External ear normal.     Left Ear: External ear normal.     Nose: Nose normal.     Mouth/Throat:     Mouth: Mucous membranes are moist.  Eyes:     Extraocular Movements: Extraocular movements intact.     Conjunctiva/sclera: Conjunctivae normal.     Pupils: Pupils are equal, round, and reactive to light.  Neck:     Musculoskeletal: Normal range of motion and neck supple.  Cardiovascular:     Rate and Rhythm: Normal rate and regular rhythm.     Pulses: Normal pulses.     Heart sounds: Normal heart sounds.   Pulmonary:     Effort: Tachypnea present.     Breath sounds: Rhonchi present.  Abdominal:     General: Abdomen is flat. Bowel sounds are normal.     Palpations: Abdomen is soft.  Musculoskeletal: Normal range of motion.  Skin:    General: Skin is warm.     Capillary Refill: Capillary refill takes less than 2 seconds.  Neurological:     General: No focal deficit present.     Mental Status: She is alert and oriented to person, place, and time.  Psychiatric:        Mood and Affect: Mood normal.        Behavior: Behavior normal.      ED Treatments / Results  Labs (all labs ordered are listed, but only abnormal results are displayed) Labs Reviewed  CBC WITH DIFFERENTIAL/PLATELET - Abnormal; Notable for the following components:      Result Value   WBC 18.2 (*)    RBC 3.13 (*)    Hemoglobin 9.9 (*)    HCT 31.2 (*)    Neutro Abs 16.0 (*)    Monocytes Absolute 1.2 (*)    Abs Immature Granulocytes 0.10 (*)    All other components within normal limits  COMPREHENSIVE METABOLIC PANEL - Abnormal; Notable for the following components:   Sodium 124 (*)    Chloride 93 (*)    CO2 21 (*)    Glucose, Bld 145 (*)    BUN 38 (*)    Calcium 8.7 (*)    Albumin 3.3 (*)    GFR calc non Af Amer 52 (*)    All other components within normal limits  URINALYSIS, ROUTINE W REFLEX MICROSCOPIC - Abnormal; Notable for the following components:   APPearance HAZY (*)    Leukocytes,Ua MODERATE (*)    Bacteria, UA MANY (*)    All other components within normal limits  CBG MONITORING, ED - Abnormal; Notable for the following components:   Glucose-Capillary 129 (*)    All other components within normal limits  SARS CORONAVIRUS 2 (HOSPITAL ORDER, PERFORMED IN [facility])  CULTURE, BLOOD (ROUTINE X 2)  CULTURE, BLOOD (ROUTINE X 2)  TROPONIN I  LACTIC ACID, PLASMA  LACTIC ACID, PLASMA    EKG EKG Interpretation  Date/Time:  Wednesday Apr 09 2019 14:39:16 EDT Ventricular Rate:  73  PR Interval:    QRS Duration: 93 QT Interval:  369 QTC Calculation: 407 R Axis:   48 Text Interpretation:  Sinus rhythm Confirmed by Jacalyn Lefevre [phone])  on 04/09/2019 3:10:06 PM   Radiology Dg Chest Port 1 View  Result Date: 04/09/2019 CLINICAL DATA:  Syncope.  Vomiting.  Productive cough. EXAM: PORTABLE CHEST 1 VIEW COMPARISON:  04/29/2014 FINDINGS: Atherosclerotic calcification of the aortic arch. Mild enlargement of the cardiopericardial silhouette. Indistinct bilateral infrahilar hazy opacity. The lungs appear otherwise clear. Bony demineralization.  Lower thoracic spondylosis. IMPRESSION: 1. Hazy infrahilar opacities bilaterally. Possibilities include atelectasis, aspiration pneumonitis, or atypical infectious process. Pulmonary edema is a less likely differential diagnostic consideration given the lack of interstitial accentuation or pulmonary vascular findings. 2.  Aortic Atherosclerosis (ICD10-I70.0). 3. Mild enlargement of the cardiopericardial silhouette. 4. Bony demineralization. Electronically Signed   By: Gaylyn RongWalter  Liebkemann M.D.   On: 04/09/2019 14:26    Procedures Procedures (including critical care time)  Medications Ordered in ED Medications  sodium chloride 0.9 % bolus 1,000 mL (1,000 mLs Intravenous New Bag/Given 04/09/19 1438)    And  0.9 %  sodium chloride infusion ( Intravenous New Bag/Given 04/09/19 1535)  cefTRIAXone (ROCEPHIN) 1 g in sodium chloride 0.9 % 100 mL IVPB (1 g Intravenous New Bag/Given 04/09/19 1535)  azithromycin (ZITHROMAX) 500 mg in sodium chloride 0.9 % 250 mL IVPB (500 mg Intravenous New Bag/Given 04/09/19 1534)     Initial Impression / Assessment and Plan / ED Course  I have reviewed the triage vital signs and the nursing notes.  Pertinent labs & imaging results that were available during my care of the patient were reviewed by me and considered in my medical decision making (see chart for details).     Pt is given IVFs which helped her bp.   She has pna on CXR, so she was given rocephin and zithromax.  Covid negative.  She probably passed out because of her low bp.  Lactic is ok, so I did not call a code sepsis.  Pt's sodium is low at 124.   Pt's daughter updated.   Pt d/w Dr. Flossie DibbleShahmehdi ([facility]) who will admit.  CRITICAL CARE Performed by: Jacalyn LefevreJulie Raeanna Soberanes   Total critical care time: 30 minutes  Critical care time was exclusive of separately billable procedures and treating other patients.  Critical care was necessary to treat or prevent imminent or life-threatening deterioration.  Critical care was time spent personally by me on the following activities: development of treatment plan with patient and/or surrogate as well as nursing, discussions with consultants, evaluation of patient's response to treatment, examination of patient, obtaining history from patient or surrogate, ordering and performing treatments and interventions, ordering and review of laboratory studies, ordering and review of radiographic studies, pulse oximetry and re-evaluation of patient's condition.  Adonis BrookSandra B Ken was evaluated in Emergency Department on 04/09/2019 for the symptoms described in the history of present illness. She was evaluated in the context of the global COVID-19 pandemic, which necessitated consideration that the patient might be at risk for infection with the SARS-CoV-2 virus that causes COVID-19. Institutional protocols and algorithms that pertain to the evaluation of patients at risk for COVID-19 are in a state of rapid change based on information released by regulatory bodies including the CDC and federal and state organizations. These policies and algorithms were followed during the patient's care in the ED. Final Clinical Impressions(s) / ED Diagnoses   Final diagnoses:  Syncope, unspecified syncope type  Hyponatremia  Pneumonia of both lower lobes due to infectious organism [facility](HCC)    ED Discharge Orders    None        Jacalyn LefevreHaviland, Trinadee Verhagen, MD 04/09/19  1643  

## 2019-04-09 NOTE — ED Notes (Signed)
Pt reports that she does not have a pacemaker.

## 2019-04-10 LAB — BASIC METABOLIC PANEL
Anion gap: 8 (ref 5–15)
BUN: 31 mg/dL — ABNORMAL HIGH (ref 8–23)
CO2: 22 mmol/L (ref 22–32)
Calcium: 8.7 mg/dL — ABNORMAL LOW (ref 8.9–10.3)
Chloride: 103 mmol/L (ref 98–111)
Creatinine, Ser: 0.91 mg/dL (ref 0.44–1.00)
GFR calc Af Amer: >60 mL/min (ref >60)
GFR calc non Af Amer: 57 mL/min — ABNORMAL LOW (ref >60)
Glucose, Bld: 101 mg/dL — ABNORMAL HIGH (ref 70–99)
Potassium: 4 mmol/L (ref 3.5–5.1)
Sodium: 133 mmol/L — ABNORMAL LOW (ref 135–145)

## 2019-04-10 LAB — CBC
HCT: 30.1 % — ABNORMAL LOW (ref 36.0–46.0)
Hemoglobin: 9.5 g/dL — ABNORMAL LOW (ref 12.0–15.0)
MCH: 31.9 pg (ref 26.0–34.0)
MCHC: 31.6 g/dL (ref 30.0–36.0)
MCV: 101 fL — ABNORMAL HIGH (ref 80.0–100.0)
Platelets: 243 10*3/uL (ref 150–400)
RBC: 2.98 MIL/uL — ABNORMAL LOW (ref 3.87–5.11)
RDW: 13.3 % (ref 11.5–15.5)
WBC: 13.5 10*3/uL — ABNORMAL HIGH (ref 4.0–10.5)
nRBC: 0 % (ref 0.0–0.2)

## 2019-04-10 LAB — PROTIME-INR
INR: 1 (ref 0.8–1.2)
Prothrombin Time: 13.5 seconds (ref 11.4–15.2)

## 2019-04-10 LAB — APTT: aPTT: 38 seconds — ABNORMAL HIGH (ref 24–36)

## 2019-04-10 LAB — GLUCOSE, CAPILLARY: Glucose-Capillary: 98 mg/dL (ref 70–99)

## 2019-04-10 MED ORDER — ASPIRIN 81 MG PO CHEW: 81.0000 mg | CHEWABLE_TABLET | Freq: Every day | ORAL | Status: DC

## 2019-04-10 MED ORDER — PANTOPRAZOLE SODIUM 40 MG PO TBEC
40.0000 mg | DELAYED_RELEASE_TABLET | Freq: Every day | ORAL | Status: DC
  Administered 2019-04-10 – 2019-04-12 (×3): 40 mg via ORAL
  Filled 2019-04-10 (×3): qty 1

## 2019-04-10 MED ORDER — CARVEDILOL 25 MG PO TABS
25.0000 mg | ORAL_TABLET | Freq: Every day | ORAL | Status: DC
  Administered 2019-04-11 – 2019-04-12 (×2): 25 mg via ORAL
  Filled 2019-04-10 (×2): qty 1

## 2019-04-10 MED ORDER — CARVEDILOL 12.5 MG PO TABS
12.5000 mg | ORAL_TABLET | Freq: Every day | ORAL | Status: DC
  Administered 2019-04-10 – 2019-04-11 (×2): 12.5 mg via ORAL
  Filled 2019-04-10 (×2): qty 1

## 2019-04-10 NOTE — Evaluation (Signed)
Physical Therapy Evaluation Patient Details Name: Jill BrookSandra B Vanosdol MRN: [id number] DOB: [birth year] Today's Date: 04/10/2019   History of Present Illness  85 y.o. female with medical history significant of hypertension, hyperlipidemia, TIAs, GERD and admitted for pneumonia and syncope.  Clinical Impression  Pt admitted with above diagnosis. Pt currently with functional limitations due to the deficits listed below (see PT Problem List).  Pt will benefit from skilled PT to increase their independence and safety with mobility to allow discharge to the venue listed below.  Pt reports she is from IL[facility] and uses RW at baseline.  Pt agreeable to mobilize today however only to recliner ("for my first time up").  SPO2 94% on room air after transfer.  Pt plans to return back to ILF upon d/c.     Follow Up Recommendations Home health PT;Supervision for mobility/OOB    Equipment Recommendations  None recommended by PT    Recommendations for Other Services       Precautions / Restrictions Precautions Precautions: Fall Restrictions Weight Bearing Restrictions: No      Mobility  Bed Mobility Overal bed mobility: Needs Assistance Bed Mobility: Supine to Sit     Supine to sit: HOB elevated;Min guard     General bed mobility comments: pt self assisted with bed rail  Transfers Overall transfer level: Needs assistance Equipment used: Rolling walker (2 wheeled) Transfers: Sit to/from [employer] CorporationStand;Stand Pivot Transfers Sit to Stand: Min guard Stand pivot transfers: Min guard       General transfer comment: effortful however pt able to perform min/guard  Ambulation/Gait             General Gait Details: pt declined today  Stairs            Wheelchair Mobility    Modified Rankin (Stroke Patients Only)       Balance Overall balance assessment: Needs assistance(reports one fall in last few months)         Standing balance support: Bilateral upper extremity  supported Standing balance-Leahy Scale: Poor Standing balance comment: requires UE support                             Pertinent Vitals/Pain Pain Assessment: No/denies pain    Home Living       Type of Home: Independent living facility         Home Equipment: Walker - 2 wheels      Prior Function Level of Independence: Independent with assistive device(s)         Comments: uses RW     Hand Dominance        Extremity/Trunk Assessment        Lower Extremity Assessment Lower Extremity Assessment: Generalized weakness    Cervical / Trunk Assessment Cervical / Trunk Assessment: Kyphotic  Communication   Communication: HOH  Cognition Arousal/Alertness: Awake/alert Behavior During Therapy: WFL for tasks assessed/performed Overall Cognitive Status: Within Functional Limits for tasks assessed                                        General Comments      Exercises     Assessment/Plan    PT Assessment Patient needs continued PT services  PT Problem List Decreased strength;Decreased balance;Decreased activity tolerance;Decreased mobility       PT Treatment Interventions DME instruction;Functional mobility training;Gait training;Therapeutic exercise;Therapeutic activities;Balance  training;Patient/family education    PT Goals (Current goals can be found in the Care Plan section)  Acute Rehab PT Goals PT Goal Formulation: With patient Time For Goal Achievement: 04/17/19 Potential to Achieve Goals: Good    Frequency Min 3X/week   Barriers to discharge        Co-evaluation               AM-PAC PT "6 Clicks" Mobility  Outcome Measure Help needed turning from your back to your side while in a flat bed without using bedrails?: A Little Help needed moving from lying on your back to sitting on the side of a flat bed without using bedrails?: A Little Help needed moving to and from a bed to a chair (including a  wheelchair)?: A Little Help needed standing up from a chair using your arms (e.g., wheelchair or bedside chair)?: A Little Help needed to walk in hospital room?: A Little Help needed climbing 3-5 steps with a railing? : A Lot 6 Click Score: 17    End of Session Equipment Utilized During Treatment: Gait belt Activity Tolerance: Patient tolerated treatment well Patient left: in chair;with chair alarm set;with call bell/phone within reach   PT Visit Diagnosis: Difficulty in walking, not elsewhere classified (R26.2)    Time: 6720-9470 PT Time Calculation (min) (ACUTE ONLY): 13 min   Charges:   PT Evaluation $PT Eval Low Complexity: 1 Low         Zenovia Jarred, PT, DPT Acute Rehabilitation Services Office: [phone] Pager: [phone]  Sarajane Jews 04/10/2019, 12:27 PM

## 2019-04-10 NOTE — Evaluation (Signed)
Clinical/Bedside Swallow Evaluation Patient Details  Name: Jill Hernandez MRN: [id number] Date of Birth: 08-02-1930  Today's Date: 04/10/2019 Time: SLP Start Time (ACUTE ONLY): 0805 SLP Stop Time (ACUTE ONLY): 0848 SLP Time Calculation (min) (ACUTE ONLY): 43 min  Past Medical History:  Past Medical History:  Diagnosis Date  . GERD (gastroesophageal reflux disease)   . Hyperlipemia   . Hypertension   . TIA (transient ischemic attack)    Past Surgical History:  Past Surgical History:  Procedure Laterality Date  . ABDOMINAL HYSTERECTOMY    . TONSILLECTOMY     HPI:  85 [facility], n/v and found to have possible aspiration pneumonitis, no hiatal hernia seen on imaging study.    Swallow evaluation ordered.  She reports significant hiatal hernia and reports she purees her foods.  She is uncertain when she was diagnosed with a hernia.  Pt CXR concerning for aspiration pneumonitis.  She does have h/o syncope prompting hospital admit 05/26/2014- she passed out, fell and subsequentially had diarrhea.  Imaging studies show pt with mid pons progressive atrophy, cerebellar tonsillar ectopia -Concerning for possible cortiomotor tract involvment that may impact pharyngeal and esophageal swallow ability.     Assessment / Plan / Recommendation Clinical Impression  Pt with negative cranial nerve evaluation and no indication of aspiration with all po observed.  She consumes puree/thin diet at home prior to admission and states she manages her swallow function.  She reports she eats slowly, consumes small bites and eats several small meals/day.  Pt easily passed 3 ounce Yale water test - indicating very little risk of aspiration with oropharyngeal swallow ability.  She does take a PPI and is uncertain if she took it the day of this occurence.  She also reports episode occured soon after her meal.  Recommend continue diet of puree/thin per pt wishes.  SLP spoke to MD who is agreeable to  plan.  SLP educated pt to findings and provided detailled information re: reflux precautions, esophageal precautions.  No follow up indicated.   SLP Visit Diagnosis: Dysphagia, unspecified (R13.10)    Aspiration Risk  Moderate aspiration risk    Diet Recommendation Thin liquid;Dysphagia 1 (Puree)   Liquid Administration via: Cup;No straw(per pt) Medication Administration: Whole meds with liquid Supervision: Patient able to self feed Compensations: Slow rate;Small sips/bites Postural Changes: Seated upright at 90 degrees;Remain upright for at least 30 minutes after po intake    Other  Recommendations Oral Care Recommendations: Oral care BID   Follow up Recommendations None      Frequency and Duration   n/a         Prognosis   good for swallow function     Swallow Study   General Date of Onset: 04/10/19 HPI: 85 [facility], n/v and found to have possible aspiration pneumonitis, no hiatal hernia seen on imaging study.    Swallow evaluation ordered.  She reports significant hiatal hernia and reports she purees her foods.  She is uncertain when she was diagnosed with a hernia.  Pt CXR concerning for aspiration pneumonitis.  She does have h/o syncope prompting hospital admit 05/26/2014- she passed out, fell and subsequentially had diarrhea.  Imaging studies show pt with mid pons progressive atrophy, cerebellar tonsillar ectopia -Concerning for possible cortiomotor tract involvment that may impact pharyngeal and esophageal swallow ability.   Type of Study: Bedside Swallow Evaluation Diet Prior to this Study: Regular;Pudding-thick liquids Temperature Spikes Noted: No Respiratory  Status: Room air History of Recent Intubation: No Behavior/Cognition: Alert;Cooperative;Pleasant mood Oral Cavity Assessment: Within Functional Limits Oral Care Completed by SLP: No Oral Cavity - Dentition: Missing dentition Vision: Functional for self-feeding Self-Feeding Abilities: Able  to feed self Patient Positioning: Upright in bed Baseline Vocal Quality: Hoarse Volitional Cough: Strong Volitional Swallow: Able to elicit    Oral/Motor/Sensory Function Overall Oral Motor/Sensory Function: Within functional limits   Ice Chips Ice chips: Not tested   Thin Liquid Thin Liquid: Within functional limits Other Comments: pt passed 3 ounce Yale water test without difficulties    Nectar Thick Nectar Thick Liquid: Not tested   Honey Thick Honey Thick Liquid: Not tested   Puree Puree: Within functional limits Presentation: Self Fed;Spoon   Solid     Other Comments: DNT, pt does not consume solids at home prior to admit      Chales Abrahams 04/10/2019,9:47 AM  Donavan Burnet, MS [facility] SLP Acute Rehab Services Pager [phone] Office [phone]

## 2019-04-10 NOTE — Consult Note (Signed)
WOC Nurse wound consult note Reason for Consult: Eval and Treat. Nursing can find no lesions, so reason for reason for consultation by wound service. Patient is mobile in bed with Braden scale score of 19.  I have requested placement of a sacral prophylactic foam dressing following discussion with Bedside RN, Morrie Sheldon, this morning.  WOC nursing team will not follow, but will remain available to this patient, the nursing and medical teams.  Please re-consult if needed. Thanks, Ladona Mow, MSN, RN, GNP, Leda Min, FAAN  Pager# [phone].

## 2019-04-10 NOTE — Progress Notes (Addendum)
PROGRESS NOTE    Jill Hernandez  ZOX:[id number] DOB: 1930/06/01 DOA: 04/09/2019 PCP: Jarrett Soho, PA-C   Brief Narrative: Jill Hernandez is a 85 year old female with past medical history of hypertension, hyperlipidemia, TIA and GERD who presented to ED on 04/09/2019 with episode of possible syncope associated with some nausea and vomiting.  She also had 3 episodes of nonbloody diarrhea.  She had some cough which was productive of yellow sputum.  She denied any chest pain, shortness of breath or fever.  She is a resident of independent living and does not report any exposure to COVID-19 patient.  She has mostly been isolating herself like several of others at the same independent living.  Based on the further work-up in the emergency department, she was diagnosed to have possible aspiration pneumonitis or atypical infectious process and she was admitted under hospitalist service with community-acquired pneumonia and started on IV Rocephin and Zithromax and SLP was consulted.  She is feeling better today.  She is not hypoxic but feels weak.  Consultants:   None  Procedures:   None  Antimicrobials:   Rocephin and azithromycin started on 04/09/2019   Subjective: Patient seen and examined.  She states that she feels better than yesterday.  No other complaint.  Objective: Vitals:   04/09/19 2149 04/10/19 0500 04/10/19 0532 04/10/19 1300  BP: (!) 113/49  (!) 132/59 129/65  Pulse: 75  80 77  Resp:   18 18  Temp:   99.3 F (37.4 C) 98.9 F (37.2 C)  TempSrc:   Oral Oral  SpO2: 96%  94% 97%  Weight:  73.7 kg    Height:        Intake/Output Summary (Last 24 hours) at 04/10/2019 1434 Last data filed at 04/10/2019 1300 Gross per 24 hour  Intake 4438.08 ml  Output 1550 ml  Net 2888.08 ml   Filed Weights   04/09/19 1855 04/10/19 0500  Weight: 74 kg 73.7 kg    Examination:  General exam: Appears calm and comfortable  Respiratory system: Scattered rhonchi bilaterally,  mostly at the bases. Respiratory effort normal. Cardiovascular system: S1 & S2 heard, RRR. No JVD, murmurs, rubs, gallops or clicks. No pedal edema. Gastrointestinal system: Abdomen is nondistended, soft and nontender. No organomegaly or masses felt. Normal bowel sounds heard. Central nervous system: Alert and oriented. No focal neurological deficits. Extremities: Symmetric 5 x 5 power. Skin: No rashes, lesions or ulcers Psychiatry: Judgement and insight appear normal. Mood & affect appropriate.    Data Reviewed: I have personally reviewed following labs and imaging studies  CBC: Recent Labs  Lab 04/09/19 1439 04/09/19 1838 04/10/19 0457  WBC 18.2* 13.7* 13.5*  NEUTROABS 16.0* 11.8*  --   HGB 9.9* 10.4* 9.5*  HCT 31.2* 31.8* 30.1*  MCV 99.7 98.5 101.0*  PLT 244 255 243   Basic Metabolic Panel: Recent Labs  Lab 04/09/19 1439 04/09/19 1838 04/10/19 0457  NA 124*  --  133*  K 4.4  --  4.0  CL 93*  --  103  CO2 21*  --  22  GLUCOSE 145*  --  101*  BUN 38*  --  31*  CREATININE 0.98 0.93 0.91  CALCIUM 8.7* 8.7* 8.7*  MG  --  2.0  --   PHOS  --  3.3  --    GFR: Estimated Creatinine Clearance: 41.7 mL/min (by C-G formula based on SCr of 0.91 mg/dL). Liver Function Tests: Recent Labs  Lab 04/09/19 1439  AST 17  ALT 13  ALKPHOS 53  BILITOT 0.9  PROT 6.8  ALBUMIN 3.3*   No results for input(s): LIPASE, AMYLASE in the last 168 hours. No results for input(s): AMMONIA in the last 168 hours. Coagulation Profile: Recent Labs  Lab 04/10/19 0457  INR 1.0   Cardiac Enzymes: Recent Labs  Lab 04/09/19 1439  TROPONINI <0.03   BNP (last 3 results) No results for input(s): PROBNP in the last 8760 hours. HbA1C: Recent Labs    04/09/19 1838  HGBA1C 5.8*   CBG: Recent Labs  Lab 04/09/19 1354 04/10/19 0745  GLUCAP 129* 98   Lipid Profile: No results for input(s): CHOL, HDL, LDLCALC, TRIG, CHOLHDL, LDLDIRECT in the last 72 hours. Thyroid Function Tests: Recent  Labs    04/09/19 1838  TSH 0.739   Anemia Panel: Recent Labs    04/09/19 1838  VITAMINB12 450   Sepsis Labs: Recent Labs  Lab 04/09/19 1511 04/09/19 1650 04/09/19 1838  PROCALCITON  --   --  2.12  LATICACIDVEN 1.8 1.0  --     Recent Results (from the past 240 hour(s))  SARS Coronavirus 2 (CEPHEID- Performed in [facility] hospital lab), Hosp Order     Status: None   Collection Time: 04/09/19  2:40 PM  Result Value Ref Range Status   SARS Coronavirus 2 NEGATIVE NEGATIVE Final    Comment: (NOTE) If result is NEGATIVE SARS-CoV-2 target nucleic acids are NOT DETECTED. The SARS-CoV-2 RNA is generally detectable in upper and lower  respiratory specimens during the acute phase of infection. The lowest  concentration of SARS-CoV-2 viral copies this assay can detect is 250  copies / mL. A negative result does not preclude SARS-CoV-2 infection  and should not be used as the sole basis for treatment or other  patient management decisions.  A negative result may occur with  improper specimen collection / handling, submission of specimen other  than nasopharyngeal swab, presence of viral mutation(s) within the  areas targeted by this assay, and inadequate number of viral copies  (<250 copies / mL). A negative result must be combined with clinical  observations, patient history, and epidemiological information. If result is POSITIVE SARS-CoV-2 target nucleic acids are DETECTED. The SARS-CoV-2 RNA is generally detectable in upper and lower  respiratory specimens dur ing the acute phase of infection.  Positive  results are indicative of active infection with SARS-CoV-2.  Clinical  correlation with patient history and other diagnostic information is  necessary to determine patient infection status.  Positive results do  not rule out bacterial infection or co-infection with other viruses. If result is PRESUMPTIVE POSTIVE SARS-CoV-2 nucleic acids MAY BE PRESENT.   A presumptive  positive result was obtained on the submitted specimen  and confirmed on repeat testing.  While 2019 novel coronavirus  (SARS-CoV-2) nucleic acids may be present in the submitted sample  additional confirmatory testing may be necessary for epidemiological  and / or clinical management purposes  to differentiate between  SARS-CoV-2 and other Sarbecovirus currently known to infect humans.  If clinically indicated additional testing with an alternate test  methodology [phone]) is advised. The SARS-CoV-2 RNA is generally  detectable in upper and lower respiratory sp ecimens during the acute  phase of infection. The expected result is Negative. Fact Sheet for Patients:  BoilerBrush.com.cy Fact Sheet for Healthcare Providers: https://pope.com/ This test is not yet approved or cleared by the [place] FDA and has been authorized for detection and/or diagnosis of SARS-CoV-2 by FDA under an  Emergency Use Authorization (EUA).  This EUA will remain in effect (meaning this test can be used) for the duration of the COVID-19 declaration under Section 564(b)(1) of the Act, 21 U.S.C. section 360bbb-3(b)(1), unless the authorization is terminated or revoked sooner. Performed at [facility], [address] [place], [place] [address]   Culture, blood (routine x 2)     Status: None (Preliminary result)   Collection Time: 04/09/19  3:11 PM  Result Value Ref Range Status   Specimen Description   Final    BLOOD RIGHT ANTECUBITAL Performed at [facility], [address] [place], [place] [address]    Special Requests   Final    BOTTLES DRAWN AEROBIC AND ANAEROBIC Blood Culture results may not be optimal due to an excessive volume of blood received in culture bottles Performed at [facility], [address] [place], [place] [address]    Culture   Final    NO GROWTH < 24 HOURS Performed at  [facility], [address] [place], [place] [address]    Report Status PENDING  Incomplete  Culture, blood (routine x 2)     Status: None (Preliminary result)   Collection Time: 04/09/19  3:40 PM  Result Value Ref Range Status   Specimen Description   Final    BLOOD LEFT ARM Performed at [facility], [address] [place], [place] [address]    Special Requests   Final    BAA BCAV Performed at [facility], [address] [place], [place] [address]    Culture   Final    NO GROWTH < 24 HOURS Performed at [facility], [address] [place], [place] [address]    Report Status PENDING  Incomplete      Radiology Studies: Dg Chest Port 1 View  Result Date: 04/09/2019 CLINICAL DATA:  Syncope.  Vomiting.  Productive cough. EXAM: PORTABLE CHEST 1 VIEW COMPARISON:  04/29/2014 FINDINGS: Atherosclerotic calcification of the aortic arch. Mild enlargement of the cardiopericardial silhouette. Indistinct bilateral infrahilar hazy opacity. The lungs appear otherwise clear. Bony demineralization.  Lower thoracic spondylosis. IMPRESSION: 1. Hazy infrahilar opacities bilaterally. Possibilities include atelectasis, aspiration pneumonitis, or atypical infectious process. Pulmonary edema is a less likely differential diagnostic consideration given the lack of interstitial accentuation or pulmonary vascular findings. 2.  Aortic Atherosclerosis (ICD10-I70.0). 3. Mild enlargement of the cardiopericardial silhouette. 4. Bony demineralization. Electronically Signed   By: Gaylyn RongWalter  Liebkemann M.D.   On: 04/09/2019 14:26    Scheduled Meds: . aspirin EC  81 mg Oral Daily  . carvedilol  12.5 mg Oral QHS  . [START ON 04/11/2019] carvedilol  25 mg Oral Daily  . docusate sodium  100 mg Oral BID  . guaiFENesin-dextromethorphan  10 mL Oral Q6H  . heparin  5,000 Units Subcutaneous Q8H  . mouth rinse  15 mL Mouth Rinse BID  . pantoprazole  40 mg Oral Daily  .  pravastatin  40 mg Oral q1800   Continuous Infusions: . sodium chloride 125 mL/hr at 04/09/19 1535  . sodium chloride 100 mL/hr at 04/10/19 0600  . azithromycin    . cefTRIAXone (ROCEPHIN)  IV       LOS: 1 day   Assessment & Plan:   Principal Problem:   PNA (pneumonia) Active Problems:   Syncope   HTN (hypertension)   HLD (hyperlipidemia)   Hyponatremia   Small vessel disease, cerebrovascular   Sepsis secondary to  community-acquired versus aspiration pneumonia: Meets sepsis criteria based on leukocytosis plus tachypnea.  Continue Rocephin and Zithromax.  COVID-19 negative.  Remains afebrile.  Leukocytosis improving.  Cultures negative so far.  She was seen by SLP as well but has very minimal aspiration risk.  She is on pured diet from home.  Asymptomatic bacteriuria: Does not need to be treated.  Possible syncope: Could very well be vasovagal due to vomiting.  Telemetry unremarkable.  Last echo in chart available from 2015 with no valvular defect.  Will repeat echo to rule out any valvular defect.  Hypotension/with a history of HTN (hypertension): Presented with hypotension but now blood pressure is up within normal limits.  Resume Coreg and Norvasc.  Hold lisinopril for now.  Acute hyponatremia: Came in with sodium 124.  Was given IV fluid.  Now 133.  Will stop IV fluids.  DVT prophylaxis: Heparin Code Status: Full code Family Communication: Discussed with patient. Disposition Plan: Potentially discharge tomorrow.  Time spent: 30 min    Hughie Closs, MD [facility] Pager [phone]  If 7PM-7AM, please contact night-coverage [email or web address] Password [facility] 04/10/2019, 2:34 PM

## 2019-04-11 ENCOUNTER — Inpatient Hospital Stay (HOSPITAL_COMMUNITY): Payer: Medicare Other

## 2019-04-11 DIAGNOSIS — R55 Syncope and collapse: Secondary | ICD-10-CM

## 2019-04-11 DIAGNOSIS — I10 Essential (primary) hypertension: Secondary | ICD-10-CM

## 2019-04-11 DIAGNOSIS — J189 Pneumonia, unspecified organism: Secondary | ICD-10-CM

## 2019-04-11 LAB — COMPREHENSIVE METABOLIC PANEL WITH GFR
ALT: 13 U/L (ref 0–44)
AST: 17 U/L (ref 15–41)
Albumin: 3.2 g/dL — ABNORMAL LOW (ref 3.5–5.0)
Alkaline Phosphatase: 47 U/L (ref 38–126)
Anion gap: 10 (ref 5–15)
BUN: 19 mg/dL (ref 8–23)
CO2: 22 mmol/L (ref 22–32)
Calcium: 8.7 mg/dL — ABNORMAL LOW (ref 8.9–10.3)
Chloride: 102 mmol/L (ref 98–111)
Creatinine, Ser: 0.66 mg/dL (ref 0.44–1.00)
GFR calc Af Amer: >60 mL/min
GFR calc non Af Amer: >60 mL/min
Glucose, Bld: 93 mg/dL (ref 70–99)
Potassium: 3.7 mmol/L (ref 3.5–5.1)
Sodium: 134 mmol/L — ABNORMAL LOW (ref 135–145)
Total Bilirubin: 0.6 mg/dL (ref 0.3–1.2)
Total Protein: 6.8 g/dL (ref 6.5–8.1)

## 2019-04-11 LAB — CBC WITH DIFFERENTIAL/PLATELET
Abs Immature Granulocytes: 0.03 10*3/uL (ref 0.00–0.07)
Basophils Absolute: 0 10*3/uL (ref 0.0–0.1)
Basophils Relative: 0 %
Eosinophils Absolute: 0.3 10*3/uL (ref 0.0–0.5)
Eosinophils Relative: 3 %
HCT: 33.2 % — ABNORMAL LOW (ref 36.0–46.0)
Hemoglobin: 10 g/dL — ABNORMAL LOW (ref 12.0–15.0)
Immature Granulocytes: 0 %
Lymphocytes Relative: 14 %
Lymphs Abs: 1.5 10*3/uL (ref 0.7–4.0)
MCH: 31 pg (ref 26.0–34.0)
MCHC: 30.1 g/dL (ref 30.0–36.0)
MCV: 102.8 fL — ABNORMAL HIGH (ref 80.0–100.0)
Monocytes Absolute: 1.1 10*3/uL — ABNORMAL HIGH (ref 0.1–1.0)
Monocytes Relative: 10 %
Neutro Abs: 7.8 10*3/uL — ABNORMAL HIGH (ref 1.7–7.7)
Neutrophils Relative %: 73 %
Platelets: 251 10*3/uL (ref 150–400)
RBC: 3.23 MIL/uL — ABNORMAL LOW (ref 3.87–5.11)
RDW: 13.4 % (ref 11.5–15.5)
WBC: 10.8 10*3/uL — ABNORMAL HIGH (ref 4.0–10.5)
nRBC: 0 % (ref 0.0–0.2)

## 2019-04-11 LAB — URINALYSIS, ROUTINE W REFLEX MICROSCOPIC
Bacteria, UA: NONE SEEN
Bilirubin Urine: NEGATIVE
Glucose, UA: NEGATIVE mg/dL
Ketones, ur: NEGATIVE mg/dL
Leukocytes,Ua: NEGATIVE
Nitrite: NEGATIVE
Protein, ur: NEGATIVE mg/dL
Specific Gravity, Urine: 1.005 (ref 1.005–1.030)
pH: 6 (ref 5.0–8.0)

## 2019-04-11 LAB — NA AND K (SODIUM & POTASSIUM), RAND UR
Potassium Urine: 11 mmol/L
Sodium, Ur: 79 mmol/L

## 2019-04-11 LAB — GLUCOSE, CAPILLARY: Glucose-Capillary: 79 mg/dL (ref 70–99)

## 2019-04-11 LAB — OSMOLALITY, URINE: Osmolality, Ur: 251 mOsm/kg — ABNORMAL LOW (ref 300–900)

## 2019-04-11 MED ORDER — ALPRAZOLAM 0.25 MG PO TABS
0.2500 mg | ORAL_TABLET | Freq: Three times a day (TID) | ORAL | Status: DC | PRN
  Administered 2019-04-11 – 2019-04-12 (×2): 0.25 mg via ORAL
  Filled 2019-04-11 (×2): qty 1

## 2019-04-11 MED ORDER — MENTHOL 3 MG MT LOZG
1.0000 | LOZENGE | OROMUCOSAL | Status: DC | PRN
  Filled 2019-04-11 (×2): qty 9

## 2019-04-11 MED ORDER — AZITHROMYCIN 250 MG PO TABS
500.0000 mg | ORAL_TABLET | ORAL | Status: DC
  Administered 2019-04-11: 500 mg via ORAL
  Filled 2019-04-11 (×2): qty 2

## 2019-04-11 NOTE — Care Management Important Message (Addendum)
Important Message  Patient Details IM Letter given to Cookie McGiboney to present to the Patient Name: Jill Hernandez MRN: [id number] Date of Birth: 05-01-1930   Medicare Important Message Given:  Yes    Caren Macadam 04/11/2019, 10:02 AMImportant Message  Patient Details  Name: Jill Hernandez MRN: [id number] Date of Birth: 29-Jun-1930   Medicare Important Message Given:  Yes    Caren Macadam 04/11/2019, 10:02 AM

## 2019-04-11 NOTE — Consult Note (Signed)
Cardiology Consult    Patient ID: Jill Hernandez MRN: [id number], DOB/AGE: Mar 28, 1930   Admit date: 04/09/2019 Date of Consult: 04/11/2019  Primary Physician: Marda Stalker, PA-C Primary Cardiologist: Previously saw Dr. Mare Ferrari in 2015. Requesting Provider: Darliss Cheney, MD  Patient Profile    Jill Hernandez is a 85 y.o. female with a history of TIA, hypertension, hyperlipidemia, and GERD but no known cardiac history, who is being seen today for the evaluation of chest pain at the request of Dr. Doristine Bosworth.  History of Present Illness    Jill Hernandez is a 85 year old female with a history of TIA, hypertension, hyperlipidemia, and GERD but no known cardiac history. She was seen by Dr. Mare Ferrari in 2015 after being admitted for syncope. Syncopal episode was felt to be secondary to orthostatic hypotension and there was no evidence for cardiac etiology at that time. Patient has not been seen by a Cardiologist since then.  Patient presented to the [facility] ED on 04/09/2019 via EMS from [facility] for evaluation of syncope. Patient reports 2 episodes of possible syncope. First episode occurred on 04/08/2019 after patient ate a meal. She reports she was sitting in her recliner and does not remember much of what happened but reports passing out and waking up with vomit on her shirt. She denies any presyncopal episodes including chest pain, shortness of breath, diaphoresis, or palpitations. Second episode occurred the next morning on 04/09/2019. Patient states she had 4 to 5 episodes of diarrhea that morning and was walking back to her bed. When she got to her bed, she started to feel light headed and passed out. She does not think she does not think she lost consciousness for very long, only a couple of minutes max. Patient thinks this second episode was due to dehydration because she started to feel better after drinking water. She denies any recent chest pain. No recent  fevers, body chills, or body aches. However, she does report a productive cough, hoarseness, and generalized weakness recently. There are no known cases of the coronavirus in her assisted living facility and she states they have been in lock-down for 2 months.   In the ED, patient mildly tachypneic but vitals stable. EKG reportedly showed sinus rhythm with rate of 73 bpm (unable to personally review remotely). Initial troponin was negative. Chest x-ray showed hazy infrahilar opacities bilaterally which could represent atelectasis, aspiration pneumonitis, or atypical infectious process (pulmonary edema felt to be less likely given lack of interstitial accentuation or pulmonary vascular findings). BNP elevated at 294.4. WBC 18.2, Hgb 9.9, Plts 244. Na 124, K 4.4, Glucose 145, SCr 0.98. Lactic acid normal. COVID-19 testing negative. Syncope was felt to be due to dehydration/hypotension. Patient received IV fluids. Patient was also started on antibiotic and admitted for further evaluation/managment of pneumonia.   Echocardiogram this admission showed LVEF of 60-65% with no evidence of regional wall motion abnormalities.  Cardiology was consulted for chest pain. However, patient repeatedly denies any chest pain to me. Patient reports she feels okay and states she has never felt very bad, just weak.   Past Medical History   Past Medical History:  Diagnosis Date   GERD (gastroesophageal reflux disease)    Hyperlipemia    Hypertension    TIA (transient ischemic attack)     Past Surgical History:  Procedure Laterality Date   ABDOMINAL HYSTERECTOMY     TONSILLECTOMY       Allergies  Allergies  Allergen Reactions  Sulfa Antibiotics Hives    Inpatient Medications     aspirin EC  81 mg Oral Daily   azithromycin  500 mg Oral Q24H   carvedilol  12.5 mg Oral QHS   carvedilol  25 mg Oral Daily   docusate sodium  100 mg Oral BID   guaiFENesin-dextromethorphan  10 mL Oral Q6H    heparin  5,000 Units Subcutaneous Q8H   mouth rinse  15 mL Mouth Rinse BID   pantoprazole  40 mg Oral Daily   pravastatin  40 mg Oral q1800    Family History    Family History  Problem Relation Age of Onset   Hypertension Mother    Hyperlipidemia Father    Hypertension Father    She indicated that the status of her mother is unknown. She indicated that the status of her father is unknown.   Social History    Social History   Socioeconomic History   Marital status: Married    Spouse name: Not on file   Number of children: Not on file   Years of education: Not on file   Highest education level: Not on file  Occupational History   Not on file  Social Needs   Financial resource strain: Not on file   Food insecurity:    Worry: Not on file    Inability: Not on file   Transportation needs:    Medical: Not on file    Non-medical: Not on file  Tobacco Use   Smoking status: Former Smoker    Years: 20.00    Types: Cigarettes    Last attempt to quit: 04/30/1963    Years since quitting: 55.9   Smokeless tobacco: Never Used  Substance and Sexual Activity   Alcohol use: No   Drug use: No   Sexual activity: Not on file  Lifestyle   Physical activity:    Days per week: Not on file    Minutes per session: Not on file   Stress: Not on file  Relationships   Social connections:    Talks on phone: Not on file    Gets together: Not on file    Attends religious service: Not on file    Active member of club or organization: Not on file    Attends meetings of clubs or organizations: Not on file    Relationship status: Not on file   Intimate partner violence:    Fear of current or ex partner: Not on file    Emotionally abused: Not on file    Physically abused: Not on file    Forced sexual activity: Not on file  Other Topics Concern   Not on file  Social History Narrative   Not on file     Review of Systems    Review of Systems  Constitutional:  Negative for chills and fever.  HENT:       Hoarse  Respiratory: Positive for cough and sputum production. Negative for hemoptysis and shortness of breath.   Cardiovascular: Negative for chest pain and palpitations.  Gastrointestinal: Positive for diarrhea and vomiting. Negative for blood in stool and nausea.  Genitourinary: Negative for hematuria.  Musculoskeletal: Negative for falls and myalgias.  Neurological: Positive for loss of consciousness and weakness.  Endo/Heme/Allergies: Does not bruise/bleed easily.  Psychiatric/Behavioral: Negative for substance abuse.    Physical Exam    Blood pressure (!) 122/58, pulse 74, temperature 98.1 F (36.7 C), temperature source Oral, resp. rate 18, height _0  (1.575 m),  weight 71.5 kg, SpO2 99 %.   Physical Exam per MD:  General: 85 y.o. female resting comfortably in no acute distress. Pleasant and cooperative. HEENT: Normal  Neck: Supple. No carotid bruits or JVD appreciated. Lungs: No increased work of breathing. Clear to auscultation bilaterally. No wheezes, rhonchi, or rales. Heart: RRR. Distinct S1 and S2. No murmurs, gallops, or rubs.  Abdomen: Soft, non-distended, and non-tender to palpation. Bowel sounds present in all 4 quadrants.   Extremities: No clubbing, cyanosis or edema. Radial, posterior tibial, and distal pedal pulses 2+ and equal bilaterally. Skin: Warm and dry. Neuro: Alert and oriented x3. No focal deficits. Moves all extremities spontaneously. Psych: Normal affect.  Labs    Troponin (Point of Care Test) No results for input(s): TROPIPOC in the last 72 hours. Recent Labs    04/09/19 1439  TROPONINI <0.03   Lab Results  Component Value Date   WBC 10.8 (H) 04/11/2019   HGB 10.0 (L) 04/11/2019   HCT 33.2 (L) 04/11/2019   MCV 102.8 (H) 04/11/2019   PLT 251 04/11/2019    Recent Labs  Lab 04/11/19 0507  NA 134*  K 3.7  CL 102  CO2 22  BUN 19  CREATININE 0.66  CALCIUM 8.7*  PROT 6.8  BILITOT 0.6    ALKPHOS 47  ALT 13  AST 17  GLUCOSE 93   Lab Results  Component Value Date   CHOL 148 02/28/2016   HDL 47 02/28/2016   LDLCALC 68 02/28/2016   TRIG 163 (H) 02/28/2016   No results found for: [facility]   Radiology Studies    Dg Chest Port 1 View  Result Date: 04/09/2019 CLINICAL DATA:  Syncope.  Vomiting.  Productive cough. EXAM: PORTABLE CHEST 1 VIEW COMPARISON:  04/29/2014 FINDINGS: Atherosclerotic calcification of the aortic arch. Mild enlargement of the cardiopericardial silhouette. Indistinct bilateral infrahilar hazy opacity. The lungs appear otherwise clear. Bony demineralization.  Lower thoracic spondylosis. IMPRESSION: 1. Hazy infrahilar opacities bilaterally. Possibilities include atelectasis, aspiration pneumonitis, or atypical infectious process. Pulmonary edema is a less likely differential diagnostic consideration given the lack of interstitial accentuation or pulmonary vascular findings. 2.  Aortic Atherosclerosis (ICD10-I70.0). 3. Mild enlargement of the cardiopericardial silhouette. 4. Bony demineralization. Electronically Signed   By: Van Clines M.D.   On: 04/09/2019 14:26    EKG     EKG: EKG was personally reviewed and demonstrates: NSR, no ST changes  Telemetry: Telemetry was personally reviewed and demonstrates: unable to review  Cardiac Imaging    Echocardiogram 04/11/2019: Impressions: 1. The left ventricle has normal systolic function with an ejection fraction of 60-65%. The cavity size was normal. There is mildly increased left ventricular wall thickness. Left ventricular diastolic Doppler parameters are consistent with impaired  relaxation. Indeterminate filling pressures The E/e' is 8-15. No evidence of left ventricular regional wall motion abnormalities.  2. The right ventricle has normal systolic function. The cavity was normal. There is no increase in right ventricular wall thickness.  3. The mitral valve is abnormal. Mild thickening of the mitral  valve leaflet. There is mild mitral annular calcification present. No evidence of mitral valve stenosis.  4. The aortic valve is abnormal. Mild sclerosis of the aortic valve.  5. The aortic root and ascending aorta are normal in size and structure.  Summary: LVEF 60-65%, mild LVH, normal wall motion, grade 1 DD, indeterminate LV filling pressure, MAC with trivial MR, trivial TR, RVSP 25 mmHg +RAP, IVC not visualized.  Assessment & Plan  Syncope  - Patient presented for evaluation of syncope, which was felt to be secondary to dehydration/hypotension given multiple episodes of diarrhea. Cardiology was consulted today for chest pain; however, patient repeated denied having any chest pain to me. Patient reports feeling lightheaded prior to one episode of syncope but denies any other prodromal symptoms. She has no known cardiac history. - Telemetry has not been functioning. I spoke to the nurse who said there wereno alarms when it was functioning.  - Initial troponin in the ED negative. - BNP mildly elevated in the 200's. - Echo this admission shows normal LVEF with no evidence of wall motion abnormalities.  - Presentation sounds like syncope was secondary to hypotension/dehydration. Also possible could be vasovagal due to vomiting. Will defer possible outpatient monitor to MD.  Pneumonia - Patient was found to have pneumonia on admission and met sepsis criteria with leukocytosis and tachypnea.  - Continue antibiotics per primary team.  - COVID-19 negative.  Hypotensive with History of Hypertension - BP was soft upon arrival with systolic BP as low as the 90's. BP improved with IV fluids in the ED.  - Most recent BP 122/58. - Continue home Coreg. - Systolic BP in the 474'Q at times so may look to slowly restart home Amlodipine or Lisinopril.   Hyponatremia - Sodium 124 on admission. Improved with IV fluids and is 134 today.  Otherwise, per primary team.  Signed, Darreld Mclean,  PA-C 04/11/2019, 2:24 PM Pager: [phone] For questions or updates, please contact   Please consult [email or web address] for contact info under Cardiology/STEMI.  I have examined the patient and reviewed assessment and plan and discussed with patient.  Agree with above as stated.    Syncope was likely due to dehydration.  Negative troponin.  Benign ECG.  Echo with normal LV function and normal aortic valve function.   I encouraged her to stay well hydrated.  She should drink plenty of water.  We discussed Zio patch to monitor rhythm at home.  She prefers to not do this.  I think this is reasonable.  If she has syncope or dizziness, could always reconsider outpatient Zio patch for rhythm monitoring.   Jill Hernandez

## 2019-04-11 NOTE — TOC Initial Note (Signed)
Transition of Care [facility]) - Initial/Assessment Note    Patient Details  Name: Jill Hernandez MRN: [id number] Date of Birth: 1930-03-13  Transition of Care [facility]) CM/SW Contact:    Geni Bers, RN Phone Number: 04/11/2019, 3:21 PM  Clinical Narrative:      Pt admitted with PNA. Plan to discharge home with [facility].               Expected Discharge Plan: Home w Home Health Services Barriers to Discharge: No Barriers Identified   Patient Goals and CMS Choice Patient states their goals for this hospitalization and ongoing recovery are:: to go back to her home.   Choice offered to / list presented to : Patient  Expected Discharge Plan and Services Expected Discharge Plan: Home w Home Health Services   Discharge Planning Services: CM Consult Post Acute Care Choice: Home Health Living arrangements for the past 2 months: Independent Living Facility                           HH Arranged: RN, PT, Nurse's Aide, OT, Social Work [employer] Agency: [facility] Date [facility] Agency Contacted: 04/11/19 Time HH Agency Contacted: 1500 Representative spoke with at [facility] Agency: Kandee Keen  Prior Living Arrangements/Services Living arrangements for the past 2 months: Marketing executive Lives with:: Self Patient language and need for interpreter reviewed:: No Do you feel safe going back to the place where you live?: Yes               Activities of Daily Living Home Assistive Devices/Equipment: Environmental consultant (specify type), Shower chair with back, Hearing aid, Eyeglasses ADL Screening (condition at time of admission) Patient's cognitive ability adequate to safely complete daily activities?: Yes Is the patient deaf or have difficulty hearing?: Yes Does the patient have difficulty seeing, even when wearing glasses/contacts?: No Does the patient have difficulty concentrating, remembering, or making decisions?: No Patient able to express need for assistance with ADLs?: Yes Does the  patient have difficulty dressing or bathing?: No Independently performs ADLs?: Yes (appropriate for developmental age) Does the patient have difficulty walking or climbing stairs?: Yes Weakness of Legs: Both Weakness of Arms/Hands: None  Permission Sought/Granted Permission sought to share information with : Case Manager                Emotional Assessment Appearance:: Appears stated age   Affect (typically observed): Accepting, Calm Orientation: : Oriented to Self, Oriented to Place, Oriented to  Time, Oriented to Situation      Admission diagnosis:  Hyponatremia [E87.1] Syncope, unspecified syncope type [R55] Pneumonia of both lower lobes due to infectious organism [facility]) [J18.1] Patient Active Problem List   Diagnosis Date Noted  . PNA (pneumonia) 04/09/2019  . Small vessel disease, cerebrovascular 02/28/2016  . Cerebral atrophy (HCC) 02/28/2016  . Hyponatremia 02/27/2016  . Syncope 04/29/2014  . TIA (transient ischemic attack) 04/29/2014  . HTN (hypertension) 04/29/2014  . HLD (hyperlipidemia) 04/29/2014  . Left knee pain 04/29/2014   PCP:  Jarrett Soho, PA-C Pharmacy:   [facility] [address], [place] - [address] [place] [address] Phone: [phone] Fax: [phone]     Social Determinants of Health (SDOH) Interventions    Readmission Risk Interventions No flowsheet data found.

## 2019-04-11 NOTE — Progress Notes (Signed)
  Echocardiogram 2D Echocardiogram has been performed.  Leta Jungling M 04/11/2019, 11:25 AM

## 2019-04-11 NOTE — Progress Notes (Signed)
PROGRESS NOTE    Jill Hernandez  NWG:[id number] DOB: July 01, 1930 DOA: 04/09/2019 PCP: Jarrett Soho, PA-C   Brief Narrative: Jill Hernandez. Jill Hernandez is a 85 year old female with past medical history of hypertension, hyperlipidemia, TIA and GERD who presented to ED on 04/09/2019 with episode of possible syncope associated with some nausea and vomiting.  She also had 3 episodes of nonbloody diarrhea.  She had some cough which was productive of yellow sputum.  She denied any chest pain, shortness of breath or fever.  She is a resident of independent living and does not report any exposure to COVID-19 patient.  She has mostly been isolating herself like several of others at the same independent living.  Based on the further work-up in the emergency department, she was diagnosed to have possible aspiration pneumonitis or atypical infectious process and she was admitted under hospitalist service with community-acquired pneumonia and started on IV Rocephin and Zithromax and SLP was consulted and she had very minimal to no dysphagia.  She states that she feels shortness of breath like yesterday and does not feel any better.  She is very anxious to go back saying that she lives in independent living and she is not comfortable that she would be able to take care of herself.  Consultants:   None  Procedures:   None  Antimicrobials:   Rocephin and azithromycin started on 04/09/2019   Subjective: Patient seen and examined.  She complains of shortness of breath like yesterday with no improvement.  She is very anxious and is requesting something to relieve her anxiety.  She states that she lives in independent living and that she is not comfortable going home as she does not think that she would be able to take care of herself.  She is requesting to keep her overnight.  Objective: Vitals:   04/10/19 1300 04/10/19 2022 04/11/19 0445 04/11/19 0500  BP: 129/65 (!) 142/62 (!) 165/82   Pulse: 77 84 78   Resp:  Temp: 98.9 F (37.2 C) 98.6 F (37 C) 98 F (36.7 C)   TempSrc: Oral     SpO2: 97% 95% 96%   Weight:    71.5 kg  Height:        Intake/Output Summary (Last 24 hours) at 04/11/2019 1107 Last data filed at 04/11/2019 0900 Gross per 24 hour  Intake 1190 ml  Output 1850 ml  Net -660 ml   Filed Weights   04/09/19 1855 04/10/19 0500 04/11/19 0500  Weight: 74 kg 73.7 kg 71.5 kg    Examination:  General exam: Appears calm and comfortable  Respiratory system: Scattered rhonchi bilaterally, mostly at the bases with scattered bilateral end expiratory wheezes. Respiratory effort normal. Cardiovascular system: S1 & S2 heard, RRR. No JVD, murmurs, rubs, gallops or clicks. No pedal edema. Gastrointestinal system: Abdomen is nondistended, soft and nontender. No organomegaly or masses felt. Normal bowel sounds heard. Central nervous system: Alert and oriented. No focal neurological deficits. Extremities: Symmetric 5 x 5 power. Skin: No rashes, lesions or ulcers Psychiatry: Judgement and insight appear normal. Mood & affect appropriate.    Data Reviewed: I have personally reviewed following labs and imaging studies  CBC: Recent Labs  Lab 04/09/19 1439 04/09/19 1838 04/10/19 0457 04/11/19 0507  WBC 18.2* 13.7* 13.5* 10.8*  NEUTROABS 16.0* 11.8*  --  7.8*  HGB 9.9* 10.4* 9.5* 10.0*  HCT 31.2* 31.8* 30.1* 33.2*  MCV 99.7 98.5 101.0* 102.8*  PLT 244 255 243 251  Basic Metabolic Panel: Recent Labs  Lab 04/09/19 1439 04/09/19 1838 04/10/19 0457 04/11/19 0507  NA 124*  --  133* 134*  K 4.4  --  4.0 3.7  CL 93*  --  103 102  CO2 21*  --  22 22  GLUCOSE 145*  --  101* 93  BUN 38*  --  31* 19  CREATININE 0.98 0.93 0.91 0.66  CALCIUM 8.7* 8.7* 8.7* 8.7*  MG  --  2.0  --   --   PHOS  --  3.3  --   --    GFR: Estimated Creatinine Clearance: 46.8 mL/min (by C-G formula based on SCr of 0.66 mg/dL). Liver Function Tests: Recent Labs  Lab 04/09/19 1439 04/11/19 0507   AST 17 17  ALT 13 13  ALKPHOS 53 47  BILITOT 0.9 0.6  PROT 6.8 6.8  ALBUMIN 3.3* 3.2*   No results for input(s): LIPASE, AMYLASE in the last 168 hours. No results for input(s): AMMONIA in the last 168 hours. Coagulation Profile: Recent Labs  Lab 04/10/19 0457  INR 1.0   Cardiac Enzymes: Recent Labs  Lab 04/09/19 1439  TROPONINI <0.03   BNP (last 3 results) No results for input(s): PROBNP in the last 8760 hours. HbA1C: Recent Labs    04/09/19 1838  HGBA1C 5.8*   CBG: Recent Labs  Lab 04/09/19 1354 04/10/19 0745 04/11/19 0736  GLUCAP 129* 98 79   Lipid Profile: No results for input(s): CHOL, HDL, LDLCALC, TRIG, CHOLHDL, LDLDIRECT in the last 72 hours. Thyroid Function Tests: Recent Labs    04/09/19 1838  TSH 0.739   Anemia Panel: Recent Labs    04/09/19 1838  VITAMINB12 450   Sepsis Labs: Recent Labs  Lab 04/09/19 1511 04/09/19 1650 04/09/19 1838  PROCALCITON  --   --  2.12  LATICACIDVEN 1.8 1.0  --     Recent Results (from the past 240 hour(s))  SARS Coronavirus 2 (CEPHEID- Performed in [facility] hospital lab), Hosp Order     Status: None   Collection Time: 04/09/19  2:40 PM  Result Value Ref Range Status   SARS Coronavirus 2 NEGATIVE NEGATIVE Final    Comment: (NOTE) If result is NEGATIVE SARS-CoV-2 target nucleic acids are NOT DETECTED. The SARS-CoV-2 RNA is generally detectable in upper and lower  respiratory specimens during the acute phase of infection. The lowest  concentration of SARS-CoV-2 viral copies this assay can detect is 250  copies / mL. A negative result does not preclude SARS-CoV-2 infection  and should not be used as the sole basis for treatment or other  patient management decisions.  A negative result may occur with  improper specimen collection / handling, submission of specimen other  than nasopharyngeal swab, presence of viral mutation(s) within the  areas targeted by this assay, and inadequate number of viral  copies  (<250 copies / mL). A negative result must be combined with clinical  observations, patient history, and epidemiological information. If result is POSITIVE SARS-CoV-2 target nucleic acids are DETECTED. The SARS-CoV-2 RNA is generally detectable in upper and lower  respiratory specimens dur ing the acute phase of infection.  Positive  results are indicative of active infection with SARS-CoV-2.  Clinical  correlation with patient history and other diagnostic information is  necessary to determine patient infection status.  Positive results do  not rule out bacterial infection or co-infection with other viruses. If result is PRESUMPTIVE POSTIVE SARS-CoV-2 nucleic acids MAY BE PRESENT.   A presumptive  positive result was obtained on the submitted specimen  and confirmed on repeat testing.  While 2019 novel coronavirus  (SARS-CoV-2) nucleic acids may be present in the submitted sample  additional confirmatory testing may be necessary for epidemiological  and / or clinical management purposes  to differentiate between  SARS-CoV-2 and other Sarbecovirus currently known to infect humans.  If clinically indicated additional testing with an alternate test  methodology [phone]) is advised. The SARS-CoV-2 RNA is generally  detectable in upper and lower respiratory sp ecimens during the acute  phase of infection. The expected result is Negative. Fact Sheet for Patients:  BoilerBrush.com.cy Fact Sheet for Healthcare Providers: https://pope.com/ This test is not yet approved or cleared by the [place] FDA and has been authorized for detection and/or diagnosis of SARS-CoV-2 by FDA under an Emergency Use Authorization (EUA).  This EUA will remain in effect (meaning this test can be used) for the duration of the COVID-19 declaration under Section 564(b)(1) of the Act, 21 U.S.C. section 360bbb-3(b)(1), unless the authorization is terminated  or revoked sooner. Performed at [facility], [address] [place], [place] [address]   Culture, blood (routine x 2)     Status: None (Preliminary result)   Collection Time: 04/09/19  3:11 PM  Result Value Ref Range Status   Specimen Description   Final    BLOOD RIGHT ANTECUBITAL Performed at [facility], [address] [place], [place] [address]    Special Requests   Final    BOTTLES DRAWN AEROBIC AND ANAEROBIC Blood Culture results may not be optimal due to an excessive volume of blood received in culture bottles Performed at [facility], [address] [place], [place] [address]    Culture   Final    NO GROWTH 2 DAYS Performed at [facility], [address] [place], [place] [address]    Report Status PENDING  Incomplete  Culture, blood (routine x 2)     Status: None (Preliminary result)   Collection Time: 04/09/19  3:40 PM  Result Value Ref Range Status   Specimen Description   Final    BLOOD LEFT ARM Performed at [facility], [address], [place], [place] [address]    Special Requests   Final    BAA BCAV Performed at [facility], [address], [place], [place] [address]    Culture   Final    NO GROWTH 2 DAYS Performed at [facility], [address], [place], [place] [address]    Report Status PENDING  Incomplete      Radiology Studies: Dg Chest Port 1 View  Result Date: 04/09/2019 CLINICAL DATA:  Syncope.  Vomiting.  Productive cough. EXAM: PORTABLE CHEST 1 VIEW COMPARISON:  04/29/2014 FINDINGS: Atherosclerotic calcification of the aortic arch. Mild enlargement of the cardiopericardial silhouette. Indistinct bilateral infrahilar hazy opacity. The lungs appear otherwise clear. Bony demineralization.  Lower thoracic spondylosis. IMPRESSION: 1. Hazy infrahilar opacities bilaterally. Possibilities include atelectasis, aspiration pneumonitis, or atypical infectious  process. Pulmonary edema is a less likely differential diagnostic consideration given the lack of interstitial accentuation or pulmonary vascular findings. 2.  Aortic Atherosclerosis (ICD10-I70.0). 3. Mild enlargement of the cardiopericardial silhouette. 4. Bony demineralization. Electronically Signed   By: Gaylyn Rong M.D.   On: 04/09/2019 14:26    Scheduled Meds: . aspirin EC  81 mg Oral Daily  . azithromycin  500 mg Oral Q24H  . carvedilol  12.5  mg Oral QHS  . carvedilol  25 mg Oral Daily  . docusate sodium  100 mg Oral BID  . guaiFENesin-dextromethorphan  10 mL Oral Q6H  . heparin  5,000 Units Subcutaneous Q8H  . mouth rinse  15 mL Mouth Rinse BID  . pantoprazole  40 mg Oral Daily  . pravastatin  40 mg Oral q1800   Continuous Infusions: . cefTRIAXone (ROCEPHIN)  IV 1 g (04/10/19 1605)     LOS: 2 days   Assessment & Plan:   Principal Problem:   PNA (pneumonia) Active Problems:   Syncope   HTN (hypertension)   HLD (hyperlipidemia)   Hyponatremia   Small vessel disease, cerebrovascular   Sepsis secondary to community-acquired versus aspiration pneumonia: Meets sepsis criteria based on leukocytosis plus tachypnea.  Still not hypoxic and not requiring any oxygen but feels shortness of breath.  Has some wheezes on exam today.  Continue Rocephin and Zithromax.  Continue Xopenex.  COVID-19 negative.  Remains afebrile.  Leukocytosis improving.  Cultures negative so far.  She was seen by SLP as well but has very minimal aspiration risk.  She is on pured diet from home.  Asymptomatic bacteriuria: Does not need to be treated.  Possible syncope: Could very well be vasovagal due to vomiting.  Telemetry unremarkable.  Last echo in chart available from 2015 with no valvular defect.  Repeat echo ordered.  Hypotension/with a history of HTN (hypertension): Presented with hypotension but blood pressure is up within normal limits.  Continue Coreg and Norvasc.  Hold lisinopril for now.   Acute hyponatremia: Came in with sodium 124.  Was given IV fluid.  Became 133 yesterday and 134 today.  Anxiety: Xanax as needed.  DVT prophylaxis: Heparin Code Status: Full code Family Communication: Discussed with patient. Disposition Plan: Potentially discharge tomorrow.  Time spent: 29 min    Hughie Closs, MD [facility] Pager [phone]  If 7PM-7AM, please contact night-coverage [email or web address] Password [facility] 04/11/2019, 11:07 AM

## 2019-04-11 NOTE — Progress Notes (Signed)
PHARMACIST - PHYSICIAN COMMUNICATION DR:   Jacqulyn Bath CONCERNING: Antibiotic IV to Oral Route Change Policy  RECOMMENDATION: This patient is receiving azithromycin by the intravenous route.  Based on criteria approved by the Pharmacy and Therapeutics Committee, the antibiotic(s) is/are being converted to the equivalent oral dose form(s).   DESCRIPTION: These criteria include:  Patient being treated for a respiratory tract infection, urinary tract infection, cellulitis or clostridium difficile associated diarrhea if on metronidazole  The patient is not neutropenic and does not exhibit a GI malabsorption state  The patient is eating (either orally or via tube) and/or has been taking other orally administered medications for a least 24 hours  The patient is improving clinically and has a Tmax < 100.5  If you have questions about this conversion, please contact the Pharmacy Department  []   [phone] )  [facility] []   [phone] )  [facility] []   [phone] )  [facility] []   [phone] )  [facility] [x]   [phone] )  [facility]   Dorna Leitz, PharmD, BCPS 04/11/2019 9:55 AM

## 2019-04-12 LAB — CBC WITH DIFFERENTIAL/PLATELET
Abs Immature Granulocytes: 0.04 10*3/uL (ref 0.00–0.07)
Basophils Absolute: 0 10*3/uL (ref 0.0–0.1)
Basophils Relative: 1 %
Eosinophils Absolute: 0.4 10*3/uL (ref 0.0–0.5)
Eosinophils Relative: 4 %
HCT: 31.3 % — ABNORMAL LOW (ref 36.0–46.0)
Hemoglobin: 9.7 g/dL — ABNORMAL LOW (ref 12.0–15.0)
Immature Granulocytes: 1 %
Lymphocytes Relative: 16 %
Lymphs Abs: 1.4 10*3/uL (ref 0.7–4.0)
MCH: 31.6 pg (ref 26.0–34.0)
MCHC: 31 g/dL (ref 30.0–36.0)
MCV: 102 fL — ABNORMAL HIGH (ref 80.0–100.0)
Monocytes Absolute: 1 10*3/uL (ref 0.1–1.0)
Monocytes Relative: 12 %
Neutro Abs: 5.9 10*3/uL (ref 1.7–7.7)
Neutrophils Relative %: 66 %
Platelets: 258 10*3/uL (ref 150–400)
RBC: 3.07 MIL/uL — ABNORMAL LOW (ref 3.87–5.11)
RDW: 13.3 % (ref 11.5–15.5)
WBC: 8.7 10*3/uL (ref 4.0–10.5)
nRBC: 0 % (ref 0.0–0.2)

## 2019-04-12 LAB — URINE CULTURE: Culture: NO GROWTH

## 2019-04-12 LAB — COMPREHENSIVE METABOLIC PANEL
ALT: 15 U/L (ref 0–44)
AST: 15 U/L (ref 15–41)
Albumin: 3 g/dL — ABNORMAL LOW (ref 3.5–5.0)
Alkaline Phosphatase: 52 U/L (ref 38–126)
Anion gap: 10 (ref 5–15)
BUN: 18 mg/dL (ref 8–23)
CO2: 25 mmol/L (ref 22–32)
Calcium: 9 mg/dL (ref 8.9–10.3)
Chloride: 99 mmol/L (ref 98–111)
Creatinine, Ser: 0.72 mg/dL (ref 0.44–1.00)
GFR calc Af Amer: >60 mL/min (ref >60)
GFR calc non Af Amer: >60 mL/min (ref >60)
Glucose, Bld: 118 mg/dL — ABNORMAL HIGH (ref 70–99)
Potassium: 3.8 mmol/L (ref 3.5–5.1)
Sodium: 134 mmol/L — ABNORMAL LOW (ref 135–145)
Total Bilirubin: 0.4 mg/dL (ref 0.3–1.2)
Total Protein: 6.8 g/dL (ref 6.5–8.1)

## 2019-04-12 LAB — GLUCOSE, CAPILLARY: Glucose-Capillary: 85 mg/dL (ref 70–99)

## 2019-04-12 MED ORDER — CEFDINIR 300 MG PO CAPS: 300.0000 mg | ORAL_CAPSULE | Freq: Two times a day (BID) | ORAL | 0 refills | Status: AC

## 2019-04-12 NOTE — Progress Notes (Signed)
Progress Note  Patient Name: Jill Hernandez Date of Encounter: 04/12/2019  Primary Cardiologist:  Eldridge Dace   Subjective   No cardiac complaints   Inpatient Medications    Scheduled Meds: . aspirin EC  81 mg Oral Daily  . azithromycin  500 mg Oral Q24H  . carvedilol  12.5 mg Oral QHS  . carvedilol  25 mg Oral Daily  . docusate sodium  100 mg Oral BID  . guaiFENesin-dextromethorphan  10 mL Oral Q6H  . heparin  5,000 Units Subcutaneous Q8H  . mouth rinse  15 mL Mouth Rinse BID  . pantoprazole  40 mg Oral Daily  . pravastatin  40 mg Oral q1800   Continuous Infusions: . cefTRIAXone (ROCEPHIN)  IV 1 g (04/11/19 1752)   PRN Meds: acetaminophen **OR** acetaminophen, ALPRAZolam, HYDROcodone-acetaminophen, ketorolac, levalbuterol, magnesium citrate, menthol-cetylpyridinium, ondansetron **OR** ondansetron (ZOFRAN) IV, senna-docusate, sorbitol, traZODone   Vital Signs    Vitals:   04/11/19 1412 04/11/19 2143 04/12/19 0450 04/12/19 0451  BP: (!) 122/58 (!) 157/66 (!) 167/80   Pulse: 74 83 92   Resp: 18 20 18    Temp: 98.1 F (36.7 C) 98.5 F (36.9 C) 98.1 F (36.7 C)   TempSrc: Oral Oral    SpO2: 99% 97% 99%   Weight:    71.1 kg  Height:        Intake/Output Summary (Last 24 hours) at 04/12/2019 0821 Last data filed at 04/12/2019 0500 Gross per 24 hour  Intake 1007 ml  Output 2625 ml  Net -1618 ml   Last 3 Weights 04/12/2019 04/11/2019 04/10/2019  Weight (lbs) 156 lb 12 oz 157 lb 10.1 oz 162 lb 7.7 oz  Weight (kg) 71.1 kg 71.5 kg 73.7 kg      Telemetry    NSR 04/12/2019  - Personally Reviewed  ECG    SR normal ECG - Personally Reviewed  Physical Exam  Elderly female  GEN: No acute distress.   Neck: No JVD Cardiac: RRR, no murmurs, rubs, or gallops.  Respiratory: Clear to auscultation bilaterally. GI: Soft, nontender, non-distended  MS: No edema; No deformity. Neuro:  Nonfocal  Psych: Normal affect   Labs    Chemistry Recent Labs  Lab 04/09/19 1439   04/10/19 0457 04/11/19 0507 04/12/19 0543  NA 124*  --  133* 134* 134*  K 4.4  --  4.0 3.7 3.8  CL 93*  --  103 102 99  CO2 21*  --  22 22 25   GLUCOSE 145*  --  101* 93 118*  BUN 38*  --  31* 19 18  CREATININE 0.98   < > 0.91 0.66 0.72  CALCIUM 8.7*   < > 8.7* 8.7* 9.0  PROT 6.8  --   --  6.8 6.8  ALBUMIN 3.3*  --   --  3.2* 3.0*  AST 17  --   --  17 15  ALT 13  --   --  13 15  ALKPHOS 53  --   --  47 52  BILITOT 0.9  --   --  0.6 0.4  GFRNONAA 52*   < > 57* >60 >60  GFRAA >60   < > >60 >60 >60  ANIONGAP 10  --  8 10 10    < > = values in this interval not displayed.     Hematology Recent Labs  Lab 04/10/19 0457 04/11/19 0507 04/12/19 0543  WBC 13.5* 10.8* 8.7  RBC 2.98* 3.23* 3.07*  HGB 9.5* 10.0* 9.7*  HCT 30.1* 33.2* 31.3*  MCV 101.0* 102.8* 102.0*  MCH 31.9 31.0 31.6  MCHC 31.6 30.1 31.0  RDW 13.3 13.4 13.3  PLT 243 251 258    Cardiac Enzymes Recent Labs  Lab 04/09/19 1439  TROPONINI <0.03   No results for input(s): TROPIPOC in the last 168 hours.   BNP Recent Labs  Lab 04/09/19 1838  BNP 294.4*     DDimer No results for input(s): DDIMER in the last 168 hours.   Radiology    No results found.  Cardiac Studies   Echo normal 04/12/19  Patient Profile     Jill Hernandez is a 85 y.o. female with a history of TIA, hypertension, hyperlipidemia, and GERD but no known cardiac history, who is being seen today for the evaluation of chest pain at the request of Dr. Jacqulyn BathPahwani.  Assessment & Plan    Chest Pain: resolved non cardiac r/o negative enzymes, normal ECG normal echo no need for further w/u   Syncope:  Non cardiac can take off telemetry dehydration plan per primary service   HTN  BP now rising resume home amlodipine today and lisinopril in am   [facility] will sign off.   Medication Recommendations:  None  Other recommendations (labs, testing, etc):  None  Follow up as an outpatient:  None   For questions or updates, please contact  [facility] Please consult [email or web address] for contact info under        Signed, Charlton HawsPeter Nishan, MD  04/12/2019, 8:21 AM

## 2019-04-12 NOTE — Discharge Summary (Signed)
Physician Discharge Summary  Jill Hernandez ZOX:[id number] DOB: 1930/09/22 DOA: 04/09/2019  PCP: Jarrett Soho, PA-C  Admit date: 04/09/2019 Discharge date: 04/12/2019  Admitted From: Independent living Disposition: Independent living with home health care  Recommendations for Outpatient Follow-up:  1. Follow up with PCP in 1-2 weeks 2. Please obtain BMP/CBC in one week 3. Please follow up on the following pending results:  Home Health: Yes Equipment/Devices: None  Discharge Condition: Stable CODE STATUS: Full code Diet recommendation: Pured diet  Subjective: Seen and examined.  She has hoarse sound and mild throat pain.  Denied any shortness of breath or other complaint.  Remains afebrile.  Brief/Interim Summary: [name]. Ternes is a 85 year old female with past medical history of hypertension, hyperlipidemia, TIA and GERD who presented to ED on 04/09/2019 with episode of possible syncope associated with some nausea and vomiting.  She also had 3 episodes of nonbloody diarrhea.  She had some cough which was productive of yellow sputum.  She denied any chest pain, shortness of breath or fever.  She is a resident of independent living and does not report any exposure to COVID-19 patient.  She has mostly been isolating herself like several of others at the same independent living.  Based on the further work-up in the emergency department, she was diagnosed to have possible aspiration pneumonitis or atypical infectious process and she was admitted under hospitalist service with community-acquired pneumonia and started on IV Rocephin and Zithromax and SLP was consulted and she had very minimal to no dysphagia.  Patient was started on pured diet which she was following even at home.  Patient never required any oxygen and she remained afebrile.  Leukocytosis improved.  Cultures remained negative.  Patient was seen by PT OT and they recommended home health care which has been arranged for her by  our excellent case managers.  Patient is hemodynamically stable and feels much better today so she will be discharged.  I am discharging her on cefdinir 300 mg twice daily for 7 days to complete the course for pneumonia.  Discharge Diagnoses:  Principal Problem:   CAP (community acquired pneumonia) Active Problems:   Syncope   HTN (hypertension)   HLD (hyperlipidemia)   Hyponatremia   Small vessel disease, cerebrovascular    Discharge Instructions  Discharge Instructions    Discharge patient   Complete by:  As directed    Discharge disposition:  06-Home-Health Care Svc   Discharge patient date:  04/12/2019     Allergies as of 04/12/2019      Reactions   Sulfa Antibiotics Hives      Medication List    TAKE these medications   acetaminophen 500 MG tablet Commonly known as:  TYLENOL Take 1,000 mg by mouth 2 (two) times daily.   amLODipine 2.5 MG tablet Commonly known as:  NORVASC Take 2.5 mg by mouth daily.   aspirin 81 MG chewable tablet Chew 81 mg by mouth daily at 3 pm.   carvedilol 12.5 MG tablet Commonly known as:  COREG Take 12.5-25 mg by mouth See admin instructions. Take 2 tablets in the AM and 1 tablet in the PM.   cefdinir 300 MG capsule Commonly known as:  OMNICEF Take 1 capsule (300 mg total) by mouth 2 (two) times daily for 7 days.   lisinopril 20 MG tablet Commonly known as:  ZESTRIL Take 20 mg by mouth at bedtime.   lovastatin 20 MG tablet Commonly known as:  MEVACOR Take 20 mg by mouth daily  with supper.   omeprazole 40 MG capsule Commonly known as:  PRILOSEC Take 40 mg by mouth daily.      Follow-up Information    Jarrett SohoWharton, Courtney, PA-C Follow up in 1 week(s).   Specialty:  Family Medicine Contact information: [address] [place] [address] [phone]          Allergies  Allergen Reactions  . Sulfa Antibiotics Hives    Consultations: None   Procedures/Studies: Dg Chest Port 1 View  Result Date:  04/09/2019 CLINICAL DATA:  Syncope.  Vomiting.  Productive cough. EXAM: PORTABLE CHEST 1 VIEW COMPARISON:  04/29/2014 FINDINGS: Atherosclerotic calcification of the aortic arch. Mild enlargement of the cardiopericardial silhouette. Indistinct bilateral infrahilar hazy opacity. The lungs appear otherwise clear. Bony demineralization.  Lower thoracic spondylosis. IMPRESSION: 1. Hazy infrahilar opacities bilaterally. Possibilities include atelectasis, aspiration pneumonitis, or atypical infectious process. Pulmonary edema is a less likely differential diagnostic consideration given the lack of interstitial accentuation or pulmonary vascular findings. 2.  Aortic Atherosclerosis (ICD10-I70.0). 3. Mild enlargement of the cardiopericardial silhouette. 4. Bony demineralization. Electronically Signed   By: Gaylyn RongWalter  Liebkemann M.D.   On: 04/09/2019 14:26      Discharge Exam: Vitals:   04/11/19 2143 04/12/19 0450  BP: (!) 157/66 (!) 167/80  Pulse: 83 92  Resp: 20 18  Temp: 98.5 F (36.9 C) 98.1 F (36.7 C)  SpO2: 97% 99%   Vitals:   04/11/19 1412 04/11/19 2143 04/12/19 0450 04/12/19 0451  BP: (!) 122/58 (!) 157/66 (!) 167/80   Pulse: 74 83 92   Resp: 18 20 18    Temp: 98.1 F (36.7 C) 98.5 F (36.9 C) 98.1 F (36.7 C)   TempSrc: Oral Oral    SpO2: 99% 97% 99%   Weight:    71.1 kg  Height:        General: Pt is alert, awake, not in acute distress Cardiovascular: RRR, S1/S2 +, no rubs, no gallops Respiratory: CTA bilaterally, no wheezing, no rhonchi Abdominal: Soft, NT, ND, bowel sounds + Extremities: no edema, no cyanosis    The results of significant diagnostics from this hospitalization (including imaging, microbiology, ancillary and laboratory) are listed below for reference.     Microbiology: Recent Results (from the past 240 hour(s))  SARS Coronavirus 2 (CEPHEID- Performed in [facility]), Hosp Order     Status: None   Collection Time: 04/09/19  2:40 PM  Result Value  Ref Range Status   SARS Coronavirus 2 NEGATIVE NEGATIVE Final    Comment: (NOTE) If result is NEGATIVE SARS-CoV-2 target nucleic acids are NOT DETECTED. The SARS-CoV-2 RNA is generally detectable in upper and lower  respiratory specimens during the acute phase of infection. The lowest  concentration of SARS-CoV-2 viral copies this assay can detect is 250  copies / mL. A negative result does not preclude SARS-CoV-2 infection  and should not be used as the sole basis for treatment or other  patient management decisions.  A negative result may occur with  improper specimen collection / handling, submission of specimen other  than nasopharyngeal swab, presence of viral mutation(s) within the  areas targeted by this assay, and inadequate number of viral copies  (<250 copies / mL). A negative result must be combined with clinical  observations, patient history, and epidemiological information. If result is POSITIVE SARS-CoV-2 target nucleic acids are DETECTED. The SARS-CoV-2 RNA is generally detectable in upper and lower  respiratory specimens dur ing the acute phase of infection.  Positive  results are indicative of active infection with SARS-CoV-2.  Clinical  correlation with patient history and other diagnostic information is  necessary to determine patient infection status.  Positive results do  not rule out bacterial infection or co-infection with other viruses. If result is PRESUMPTIVE POSTIVE SARS-CoV-2 nucleic acids MAY BE PRESENT.   A presumptive positive result was obtained on the submitted specimen  and confirmed on repeat testing.  While 2019 novel coronavirus  (SARS-CoV-2) nucleic acids may be present in the submitted sample  additional confirmatory testing may be necessary for epidemiological  and / or clinical management purposes  to differentiate between  SARS-CoV-2 and other Sarbecovirus currently known to infect humans.  If clinically indicated additional testing with an  alternate test  methodology [phone]) is advised. The SARS-CoV-2 RNA is generally  detectable in upper and lower respiratory sp ecimens during the acute  phase of infection. The expected result is Negative. Fact Sheet for Patients:  BoilerBrush.com.cy Fact Sheet for Healthcare Providers: https://pope.com/ This test is not yet approved or cleared by the [place] FDA and has been authorized for detection and/or diagnosis of SARS-CoV-2 by FDA under an Emergency Use Authorization (EUA).  This EUA will remain in effect (meaning this test can be used) for the duration of the COVID-19 declaration under Section 564(b)(1) of the Act, 21 U.S.C. section 360bbb-3(b)(1), unless the authorization is terminated or revoked sooner. Performed at [facility], [address], [place], [place] [address]   Culture, blood (routine x 2)     Status: None (Preliminary result)   Collection Time: 04/09/19  3:11 PM  Result Value Ref Range Status   Specimen Description   Final    BLOOD RIGHT ANTECUBITAL Performed at [facility], [address], [place], [place] [address]    Special Requests   Final    BOTTLES DRAWN AEROBIC AND ANAEROBIC Blood Culture results may not be optimal due to an excessive volume of blood received in culture bottles Performed at [facility], [address], [place], [place] [address]    Culture   Final    NO GROWTH 3 DAYS Performed at [facility], [address], [place], [place] [address]    Report Status PENDING  Incomplete  Culture, blood (routine x 2)     Status: None (Preliminary result)   Collection Time: 04/09/19  3:40 PM  Result Value Ref Range Status   Specimen Description   Final    BLOOD LEFT ARM Performed at [facility], [address] [place] [address] [place], [place] [address]    Special Requests   Final    BAA BCAV Performed at [facility], [address] [place], [place] [address]    Culture   Final    NO GROWTH 3 DAYS Performed at [facility], [address] [place], [place] [address]    Report Status PENDING  Incomplete  Culture, Urine     Status: None   Collection Time: 04/10/19 11:46 PM  Result Value Ref Range Status   Specimen Description   Final    URINE, CLEAN CATCH Performed at [facility], [address] [place], [place] [address]    Special Requests   Final    NONE Performed at [facility], [address], [place], [place] [address]    Culture   Final    NO GROWTH Performed at [facility], [address], [place], [place] [address]    Report Status 04/12/2019 FINAL  Final     Labs: BNP (last 3 results) Recent Labs    04/09/19 1838  BNP 294.4*   Basic Metabolic Panel: Recent Labs  Lab 04/09/19 1439 04/09/19 1838 04/10/19 0457 04/11/19 0507 04/12/19 0543  NA 124*  --  133* 134* 134*  K 4.4  --  4.0 3.7 3.8  CL 93*  --  103 102 99  CO2 21*  --  GLUCOSE 145*  --  101* 93 118*  BUN 38*  --  31* 19 18  CREATININE 0.98 0.93 0.91 0.66 0.72  CALCIUM 8.7* 8.7* 8.7* 8.7* 9.0  MG  --  2.0  --   --   --   PHOS  --  3.3  --   --   --    Liver Function Tests: Recent Labs  Lab 04/09/19 1439 04/11/19 0507 04/12/19 0543  AST ALT ALKPHOS 53 47 52  BILITOT 0.9 0.6 0.4  PROT 6.8 6.8 6.8  ALBUMIN 3.3* 3.2* 3.0*   No results for input(s): LIPASE, AMYLASE in the last 168 hours. No results for input(s): AMMONIA in the last 168 hours. CBC: Recent Labs  Lab 04/09/19 1439 04/09/19 1838 04/10/19 0457 04/11/19 0507 04/12/19 0543  WBC 18.2* 13.7* 13.5* 10.8* 8.7  NEUTROABS 16.0* 11.8*  --  7.8* 5.9  HGB 9.9* 10.4* 9.5* 10.0* 9.7*  HCT 31.2* 31.8* 30.1* 33.2* 31.3*  MCV 99.7 98.5 101.0* 102.8* 102.0*  PLT 244 255 243 251 258   Cardiac Enzymes: Recent Labs  Lab 04/09/19 1439  TROPONINI <0.03    BNP: Invalid input(s): POCBNP CBG: Recent Labs  Lab 04/09/19 1354 04/10/19 0745 04/11/19 0736 04/12/19 0736  GLUCAP 129* 98 79 85   D-Dimer No results for input(s): DDIMER in the last 72 hours. Hgb A1c Recent Labs    04/09/19 1838  HGBA1C 5.8*   Lipid Profile No results for input(s): CHOL, HDL, LDLCALC, TRIG, CHOLHDL, LDLDIRECT in the last 72 hours. Thyroid function studies Recent Labs    04/09/19 1838  TSH 0.739   Anemia work up Recent Labs    04/09/19 1838  VITAMINB12 450   Urinalysis    Component Value Date/Time   COLORURINE COLORLESS (A) 04/10/2019 2346   APPEARANCEUR CLEAR 04/10/2019 2346   LABSPEC 1.005 04/10/2019 2346   PHURINE 6.0 04/10/2019 2346   GLUCOSEU NEGATIVE 04/10/2019 2346   HGBUR MODERATE (A) 04/10/2019 2346   BILIRUBINUR NEGATIVE 04/10/2019 2346   KETONESUR NEGATIVE 04/10/2019 2346   PROTEINUR NEGATIVE 04/10/2019 2346   UROBILINOGEN 0.2 04/29/2014 1514   NITRITE NEGATIVE 04/10/2019 2346   LEUKOCYTESUR NEGATIVE 04/10/2019 2346   Sepsis Labs Invalid input(s): PROCALCITONIN,  WBC,  LACTICIDVEN Microbiology Recent Results (from the past 240 hour(s))  SARS Coronavirus 2 (CEPHEID- Performed in [facility] hospital lab), Hosp Order     Status: None   Collection Time: 04/09/19  2:40 PM  Result Value Ref Range Status   SARS Coronavirus 2 NEGATIVE NEGATIVE Final    Comment: (NOTE) If result is NEGATIVE SARS-CoV-2 target nucleic acids are NOT DETECTED. The SARS-CoV-2 RNA is generally detectable in upper and lower  respiratory specimens during the acute phase of infection. The lowest  concentration of SARS-CoV-2 viral copies this assay can detect is 250  copies / mL. A negative result does not preclude SARS-CoV-2 infection  and should not be used as the sole  basis for treatment or other  patient management decisions.  A negative result may occur with  improper specimen collection / handling, submission of specimen other  than  nasopharyngeal swab, presence of viral mutation(s) within the  areas targeted by this assay, and inadequate number of viral copies  (<250 copies / mL). A negative result must be combined with clinical  observations, patient history, and epidemiological information. If result is POSITIVE SARS-CoV-2 target nucleic acids are DETECTED. The SARS-CoV-2 RNA is generally detectable in upper and lower  respiratory specimens dur ing the acute phase of infection.  Positive  results are indicative of active infection with SARS-CoV-2.  Clinical  correlation with patient history and other diagnostic information is  necessary to determine patient infection status.  Positive results do  not rule out bacterial infection or co-infection with other viruses. If result is PRESUMPTIVE POSTIVE SARS-CoV-2 nucleic acids MAY BE PRESENT.   A presumptive positive result was obtained on the submitted specimen  and confirmed on repeat testing.  While 2019 novel coronavirus  (SARS-CoV-2) nucleic acids may be present in the submitted sample  additional confirmatory testing may be necessary for epidemiological  and / or clinical management purposes  to differentiate between  SARS-CoV-2 and other Sarbecovirus currently known to infect humans.  If clinically indicated additional testing with an alternate test  methodology [phone]) is advised. The SARS-CoV-2 RNA is generally  detectable in upper and lower respiratory sp ecimens during the acute  phase of infection. The expected result is Negative. Fact Sheet for Patients:  BoilerBrush.com.cy Fact Sheet for Healthcare Providers: https://pope.com/ This test is not yet approved or cleared by the [place] FDA and has been authorized for detection and/or diagnosis of SARS-CoV-2 by FDA under an Emergency Use Authorization (EUA).  This EUA will remain in effect (meaning this test can be used) for the duration of  the COVID-19 declaration under Section 564(b)(1) of the Act, 21 U.S.C. section 360bbb-3(b)(1), unless the authorization is terminated or revoked sooner. Performed at [facility], [address], [place], [place] [address]   Culture, blood (routine x 2)     Status: None (Preliminary result)   Collection Time: 04/09/19  3:11 PM  Result Value Ref Range Status   Specimen Description   Final    BLOOD RIGHT ANTECUBITAL Performed at [facility] [address], [place], [place] [address]    Special Requests   Final    BOTTLES DRAWN AEROBIC AND ANAEROBIC Blood Culture results may not be optimal due to an excessive volume of blood received in culture bottles Performed at [facility], [address], [place], [place] [address]    Culture   Final    NO GROWTH 3 DAYS Performed at [facility], [address] [place], [place] [address]    Report Status PENDING  Incomplete  Culture, blood (routine x 2)     Status: None (Preliminary result)   Collection Time: 04/09/19  3:40 PM  Result Value Ref Range Status   Specimen Description   Final    BLOOD LEFT ARM Performed at [facility], [address], [place], [place] [address]    Special Requests   Final    BAA BCAV Performed at [facility], [address], [place], [place] [address]    Culture   Final    NO GROWTH 3 DAYS Performed at [facility], [address] [place], [place] [address]  Report Status PENDING  Incomplete  Culture, Urine     Status: None   Collection Time: 04/10/19 11:46 PM  Result Value Ref Range Status   Specimen Description   Final    URINE, CLEAN CATCH Performed at [facility], [address], [place], [place] [address]    Special Requests   Final    NONE Performed at [facility], [address] [place], [place] [address]    Culture   Final    NO GROWTH Performed at  [facility], [address], [place], [place] [address]    Report Status 04/12/2019 FINAL  Final     Time coordinating discharge: 30 minutes  SIGNED:   Hughie Closs, MD  [facility] 04/12/2019, 10:21 AM Pager [phone]  If 7PM-7AM, please contact night-coverage [email or web address] Password [email or web address]

## 2019-04-12 NOTE — Discharge Instructions (Signed)
Aspiration Pneumonia  Aspiration pneumonia is an infection in the lungs. It occurs when saliva or liquid contaminated with bacteria is inhaled (aspirated) into the lungs. When these things get into the lungs, swelling (inflammation) and infection can occur. This can make it difficult to breathe. Aspiration pneumonia is a serious condition and can be life threatening.  What are the causes?  This condition is caused when saliva or liquid from the mouth, throat, or stomach is inhaled into the lungs, and when those fluids are contaminated with bacteria.  What increases the risk?  The following factors may make you more likely to develop this condition:   A narrowing of the tube that carries food to the stomach (esophageal narrowing).   Having gastroesophageal reflux disease (GERD).   Having a weak immune system.   Having diabetes.   Having poor oral hygiene.   Being malnourished.  The condition is more likely to occur when a person's cough (gag) reflex, or ability to swallow, has decreased. Some things that can cause this decrease include:   Having a brain injury or disease, such as stroke, seizures, Parkinson disease, dementia, or amyotrophic lateral sclerosis (ALS).   Being given a general anesthetic for procedures.   Drinking too much alcohol. If a person passes out and vomits, vomit can be inhaled into the lungs.   Taking certain medicines, such as tranquilizers or sedatives.  What are the signs or symptoms?  Symptoms of this condition include:   Fever.   A cough with secretions that are yellow, tan, or green.   Breathing problems, such as wheezing or shortness of breath.   Chest pain.   Being more tired than usual (fatigue).   Having a history of coughing while eating or drinking.   Bad breath.   Bluish color to the lips, skin, or fingers.  How is this diagnosed?  This condition may be diagnosed based on:   A physical exam.   Tests, such as:  ? Chest X-ray.  ? Sputum culture. Saliva and mucus  (sputum) are collected from the lungs or the tubes that carry air to the lungs (bronchi). The sputum is then tested for bacteria.  ? Oximetry. A sensor or clip is placed on areas such as a finger, earlobe, or toe to measure the oxygen level in your blood.  ? Blood tests.  ? Swallowing study. This test looks at how food is swallowed and whether it goes into your breathing tube (trachea) or esophagus.  ? Bronchoscopy. This test uses a flexible tube (bronchoscope) to see inside the lungs.  How is this treated?  This condition may be treated with:   Medicines. Antibiotic medicine will be given to kill the pneumonia bacteria. Other medicines may also be used to reduce fever or pain.   Breathing assistance and oxygen therapy. Depending on how well you are breathing, you may need to be given oxygen, or you may need breathing support from a breathing machine (ventilator).   Thoracentesis. This is a procedure to remove fluid that has built up in the space between the linings of the chest wall and the lungs.   Feeding tube and diet change. For people who have difficulty swallowing, a feeding tube might be placed in the stomach, or they may be asked to avoid certain food textures or liquids when eating.  Follow these instructions at home:  Medicines   Take over-the-counter and prescription medicines only as told by your health care provider.  ? If you were prescribed   an antibiotic medicine, take it as told by your health care provider. Do not stop taking the antibiotic even if you start to feel better.  ? Take cough medicine only if you are losing sleep. Cough medicine can prevent your body's natural ability to remove mucus from your lungs.  General instructions   Carefully follow any eating instructions you were given, such as avoiding certain food textures or thickening your liquids. Thickening liquids reduces the risk of developing aspiration pneumonia again.   Use breathing exercises such as postural drainage, deep  breathing, and incentive spirometry to help expel secretions.   Rest as instructed by your health care provider.   Sleep in a semi-upright position at night. Try to sleep in a reclining chair, or place a few pillows under your head.   Do not use any products that contain nicotine or tobacco, such as cigarettes and e-cigarettes. If you need help quitting, ask your health care provider.   Keep all follow-up visits as told by your health care provider. This is important.  Contact a health care provider if:   You have a fever.   You have a worsening cough with yellow, tan, or green secretions.   You have coughing while eating or drinking.  Get help right away if:   You have worsening shortness of breath, wheezing, or difficulty breathing.   You have chest pain.  Summary   Aspiration pneumonia is an infection in the lungs. It is caused when saliva or liquid from the mouth, throat, or stomach is inhaled into the lungs.   Aspiration pneumonia is more likely to occur when a person's cough reflex or ability to swallow has decreased.   Symptoms of aspiration pneumonia include coughing, breathing problems, fever, and chest pain.   Aspiration pneumonia may be treated with antibiotic medicine, other medicines to reduce pain or fever, and breathing assistance or oxygen therapy.  This information is not intended to replace advice given to you by your health care provider. Make sure you discuss any questions you have with your health care provider.  Document Released: 09/10/2009 Document Revised: 12/19/2016 Document Reviewed: 12/19/2016  Elsevier Interactive Patient Education  2019 [employer] Inc[employer]

## 2019-04-14 LAB — CULTURE, BLOOD (ROUTINE X 2)
Culture: NO GROWTH
Culture: NO GROWTH

## 2019-09-17 ENCOUNTER — Other Ambulatory Visit: Payer: Self-pay | Admitting: Gastroenterology

## 2019-09-17 DIAGNOSIS — R1319 Other dysphagia: Secondary | ICD-10-CM

## 2019-09-17 DIAGNOSIS — R1310 Dysphagia, unspecified: Secondary | ICD-10-CM

## 2019-09-24 ENCOUNTER — Ambulatory Visit
Admission: RE | Admit: 2019-09-24 | Discharge: 2019-09-24 | Disposition: A | Discharge status: Home / Self Care | Payer: Medicare Other | Source: Ambulatory Visit | Attending: Gastroenterology | Admitting: Gastroenterology

## 2019-09-24 ENCOUNTER — Other Ambulatory Visit: Payer: Self-pay | Admitting: Gastroenterology

## 2019-09-24 DIAGNOSIS — R1319 Other dysphagia: Secondary | ICD-10-CM

## 2019-09-24 DIAGNOSIS — R1310 Dysphagia, unspecified: Secondary | ICD-10-CM

## 2020-10-03 DIAGNOSIS — Z23 Encounter for immunization: Secondary | ICD-10-CM | POA: Diagnosis not present

## 2021-02-07 DIAGNOSIS — Z0000 Encounter for general adult medical examination without abnormal findings: Secondary | ICD-10-CM | POA: Diagnosis not present

## 2021-02-07 DIAGNOSIS — Z1389 Encounter for screening for other disorder: Secondary | ICD-10-CM | POA: Diagnosis not present

## 2021-04-19 DIAGNOSIS — I10 Essential (primary) hypertension: Secondary | ICD-10-CM | POA: Diagnosis not present

## 2021-04-19 DIAGNOSIS — N183 Chronic kidney disease, stage 3 unspecified: Secondary | ICD-10-CM | POA: Diagnosis not present

## 2021-04-19 DIAGNOSIS — D509 Iron deficiency anemia, unspecified: Secondary | ICD-10-CM | POA: Diagnosis not present

## 2021-04-19 DIAGNOSIS — Z8673 Personal history of transient ischemic attack (TIA), and cerebral infarction without residual deficits: Secondary | ICD-10-CM | POA: Diagnosis not present

## 2021-04-19 DIAGNOSIS — R600 Localized edema: Secondary | ICD-10-CM | POA: Diagnosis not present

## 2021-04-19 DIAGNOSIS — R7301 Impaired fasting glucose: Secondary | ICD-10-CM | POA: Diagnosis not present

## 2021-04-19 DIAGNOSIS — M170 Bilateral primary osteoarthritis of knee: Secondary | ICD-10-CM | POA: Diagnosis not present

## 2021-04-19 DIAGNOSIS — K640 First degree hemorrhoids: Secondary | ICD-10-CM | POA: Diagnosis not present

## 2021-04-19 DIAGNOSIS — K219 Gastro-esophageal reflux disease without esophagitis: Secondary | ICD-10-CM | POA: Diagnosis not present

## 2021-05-05 DIAGNOSIS — Z712 Person consulting for explanation of examination or test findings: Secondary | ICD-10-CM | POA: Diagnosis not present

## 2021-05-05 DIAGNOSIS — D649 Anemia, unspecified: Secondary | ICD-10-CM | POA: Diagnosis not present

## 2021-05-05 DIAGNOSIS — L03115 Cellulitis of right lower limb: Secondary | ICD-10-CM | POA: Diagnosis not present

## 2021-05-05 DIAGNOSIS — B351 Tinea unguium: Secondary | ICD-10-CM | POA: Diagnosis not present

## 2021-05-26 DIAGNOSIS — I10 Essential (primary) hypertension: Secondary | ICD-10-CM | POA: Diagnosis not present

## 2021-05-26 DIAGNOSIS — Z6833 Body mass index (BMI) 33.0-33.9, adult: Secondary | ICD-10-CM | POA: Diagnosis not present

## 2021-06-24 DIAGNOSIS — I10 Essential (primary) hypertension: Secondary | ICD-10-CM | POA: Diagnosis not present

## 2021-06-24 DIAGNOSIS — M170 Bilateral primary osteoarthritis of knee: Secondary | ICD-10-CM | POA: Diagnosis not present

## 2021-06-24 DIAGNOSIS — Z6832 Body mass index (BMI) 32.0-32.9, adult: Secondary | ICD-10-CM | POA: Diagnosis not present

## 2021-06-29 DIAGNOSIS — M25561 Pain in right knee: Secondary | ICD-10-CM | POA: Diagnosis not present

## 2021-06-29 DIAGNOSIS — M67442 Ganglion, left hand: Secondary | ICD-10-CM | POA: Diagnosis not present

## 2021-06-29 DIAGNOSIS — M25562 Pain in left knee: Secondary | ICD-10-CM | POA: Diagnosis not present

## 2021-07-13 DIAGNOSIS — Z131 Encounter for screening for diabetes mellitus: Secondary | ICD-10-CM | POA: Diagnosis not present

## 2021-07-13 DIAGNOSIS — R350 Frequency of micturition: Secondary | ICD-10-CM | POA: Diagnosis not present

## 2021-07-13 DIAGNOSIS — I10 Essential (primary) hypertension: Secondary | ICD-10-CM | POA: Diagnosis not present

## 2021-07-23 ENCOUNTER — Emergency Department (HOSPITAL_COMMUNITY): Payer: Medicare Other

## 2021-07-23 ENCOUNTER — Emergency Department (HOSPITAL_COMMUNITY)
Admission: EM | Admit: 2021-07-23 | Discharge: 2021-07-23 | Disposition: A | Discharge status: Home / Self Care | Payer: Medicare Other | Attending: Emergency Medicine | Admitting: Emergency Medicine

## 2021-07-23 ENCOUNTER — Other Ambulatory Visit: Payer: Self-pay

## 2021-07-23 DIAGNOSIS — Z79899 Other long term (current) drug therapy: Secondary | ICD-10-CM | POA: Diagnosis not present

## 2021-07-23 DIAGNOSIS — Z7982 Long term (current) use of aspirin: Secondary | ICD-10-CM | POA: Insufficient documentation

## 2021-07-23 DIAGNOSIS — R791 Abnormal coagulation profile: Secondary | ICD-10-CM | POA: Insufficient documentation

## 2021-07-23 DIAGNOSIS — I4891 Unspecified atrial fibrillation: Secondary | ICD-10-CM | POA: Diagnosis not present

## 2021-07-23 DIAGNOSIS — Z20822 Contact with and (suspected) exposure to covid-19: Secondary | ICD-10-CM | POA: Diagnosis not present

## 2021-07-23 DIAGNOSIS — Z87891 Personal history of nicotine dependence: Secondary | ICD-10-CM | POA: Insufficient documentation

## 2021-07-23 DIAGNOSIS — I10 Essential (primary) hypertension: Secondary | ICD-10-CM | POA: Insufficient documentation

## 2021-07-23 DIAGNOSIS — H538 Other visual disturbances: Secondary | ICD-10-CM | POA: Diagnosis not present

## 2021-07-23 DIAGNOSIS — R42 Dizziness and giddiness: Secondary | ICD-10-CM | POA: Insufficient documentation

## 2021-07-23 DIAGNOSIS — R519 Headache, unspecified: Secondary | ICD-10-CM | POA: Diagnosis not present

## 2021-07-23 DIAGNOSIS — G459 Transient cerebral ischemic attack, unspecified: Secondary | ICD-10-CM | POA: Diagnosis not present

## 2021-07-23 DIAGNOSIS — I671 Cerebral aneurysm, nonruptured: Secondary | ICD-10-CM | POA: Diagnosis not present

## 2021-07-23 DIAGNOSIS — I6521 Occlusion and stenosis of right carotid artery: Secondary | ICD-10-CM | POA: Diagnosis not present

## 2021-07-23 DIAGNOSIS — I6621 Occlusion and stenosis of right posterior cerebral artery: Secondary | ICD-10-CM | POA: Diagnosis not present

## 2021-07-23 DIAGNOSIS — Y900 Blood alcohol level of less than 20 mg/100 ml: Secondary | ICD-10-CM | POA: Insufficient documentation

## 2021-07-23 LAB — COMPREHENSIVE METABOLIC PANEL
ALT: 14 U/L (ref 0–44)
AST: 17 U/L (ref 15–41)
Albumin: 3.3 g/dL — ABNORMAL LOW (ref 3.5–5.0)
Alkaline Phosphatase: 75 U/L (ref 38–126)
Anion gap: 11 (ref 5–15)
BUN: 25 mg/dL — ABNORMAL HIGH (ref 8–23)
CO2: 27 mmol/L (ref 22–32)
Calcium: 10.3 mg/dL (ref 8.9–10.3)
Chloride: 91 mmol/L — ABNORMAL LOW (ref 98–111)
Creatinine, Ser: 0.82 mg/dL (ref 0.44–1.00)
GFR, Estimated: >60 mL/min (ref >60)
Glucose, Bld: 111 mg/dL — ABNORMAL HIGH (ref 70–99)
Potassium: 4.1 mmol/L (ref 3.5–5.1)
Sodium: 129 mmol/L — ABNORMAL LOW (ref 135–145)
Total Bilirubin: 0.9 mg/dL (ref 0.3–1.2)
Total Protein: 7 g/dL (ref 6.5–8.1)

## 2021-07-23 LAB — URINALYSIS, ROUTINE W REFLEX MICROSCOPIC
Bilirubin Urine: NEGATIVE
Glucose, UA: NEGATIVE mg/dL
Hgb urine dipstick: NEGATIVE
Ketones, ur: NEGATIVE mg/dL
Leukocytes,Ua: NEGATIVE
Nitrite: NEGATIVE
Protein, ur: NEGATIVE mg/dL
Specific Gravity, Urine: 1.014 (ref 1.005–1.030)
pH: 7 (ref 5.0–8.0)

## 2021-07-23 LAB — CBC
HCT: 36.6 % (ref 36.0–46.0)
Hemoglobin: 11.5 g/dL — ABNORMAL LOW (ref 12.0–15.0)
MCH: 30.4 pg (ref 26.0–34.0)
MCHC: 31.4 g/dL (ref 30.0–36.0)
MCV: 96.8 fL (ref 80.0–100.0)
Platelets: 270 10*3/uL (ref 150–400)
RBC: 3.78 MIL/uL — ABNORMAL LOW (ref 3.87–5.11)
RDW: 14 % (ref 11.5–15.5)
WBC: 8.6 10*3/uL (ref 4.0–10.5)
nRBC: 0 % (ref 0.0–0.2)

## 2021-07-23 LAB — RESP PANEL BY RT-PCR (FLU A&B, COVID) ARPGX2
Influenza A by PCR: NEGATIVE
Influenza B by PCR: NEGATIVE
SARS Coronavirus 2 by RT PCR: NEGATIVE

## 2021-07-23 LAB — DIFFERENTIAL
Abs Immature Granulocytes: 0.03 10*3/uL (ref 0.00–0.07)
Basophils Absolute: 0 10*3/uL (ref 0.0–0.1)
Basophils Relative: 0 %
Eosinophils Absolute: 0.3 10*3/uL (ref 0.0–0.5)
Eosinophils Relative: 4 %
Immature Granulocytes: 0 %
Lymphocytes Relative: 19 %
Lymphs Abs: 1.6 10*3/uL (ref 0.7–4.0)
Monocytes Absolute: 1.1 10*3/uL — ABNORMAL HIGH (ref 0.1–1.0)
Monocytes Relative: 12 %
Neutro Abs: 5.5 10*3/uL (ref 1.7–7.7)
Neutrophils Relative %: 65 %

## 2021-07-23 LAB — ETHANOL: Alcohol, Ethyl (B): <10 mg/dL (ref <10)

## 2021-07-23 LAB — RAPID URINE DRUG SCREEN, HOSP PERFORMED
Amphetamines: NOT DETECTED
Barbiturates: NOT DETECTED
Benzodiazepines: NOT DETECTED
Cocaine: NOT DETECTED
Opiates: NOT DETECTED
Tetrahydrocannabinol: NOT DETECTED

## 2021-07-23 LAB — APTT: aPTT: 38 seconds — ABNORMAL HIGH (ref 24–36)

## 2021-07-23 LAB — PROTIME-INR
INR: 1 (ref 0.8–1.2)
Prothrombin Time: 12.7 seconds (ref 11.4–15.2)

## 2021-07-23 MED ORDER — IOHEXOL 350 MG/ML SOLN
75.0000 mL | Freq: Once | INTRAVENOUS | Status: AC | PRN
  Administered 2021-07-23: 75 mL via INTRAVENOUS

## 2021-07-23 NOTE — ED Notes (Signed)
Patient ambulated well in hall with walker with no complaints. No notable change in BP, O2, or pulse after returning to bed.

## 2021-07-23 NOTE — ED Triage Notes (Signed)
Pt arrived via [facility] from independent living facility ([facility]). Per EMS, pt c/o of dizziness and blurred vision with onset being 8/26 at 2200 without improvement. Pt took BP meds at midnight, EMS initially had BP 177/86 which improved to 151/67. EMS also reports pt showed to be in afib on the monitor and on 12 lead ECG with pt denying hx of afib. Stoke screen negative while in EMS care. Pt caox4 on arrival to ED.

## 2021-07-23 NOTE — ED Notes (Addendum)
Changed linens and gown on pt and placed a purewick

## 2021-07-23 NOTE — ED Provider Notes (Signed)
[facility] EMERGENCY DEPARTMENT Provider Note   CSN: [id number] Arrival date & time: 07/23/21  6754     History Chief Complaint  Patient presents with   Dizziness    With blurred vision     Jill Hernandez is a 87 y.o. female.  87 year old female who presents from her facility secondary to an episode of blurred vision.  Patient states that she had bilateral blurry vision earlier tonight.  She states that it lasted about 30 to 40 minutes.  She had a little of a headache with it but no other associated symptoms.  She had no paresthesias or weakness.  She had no trouble swallowing or breathing.  Has resolved prior to arrival here.  No chest pain, nausea, vomiting or any other associated symptoms.  She is been eating and drinking relatively normal.  She is hard of hearing but that is at baseline.   Dizziness     Past Medical History:  Diagnosis Date   GERD (gastroesophageal reflux disease)    Hyperlipemia    Hypertension    TIA (transient ischemic attack)     Patient Active Problem List   Diagnosis Date Noted   CAP (community acquired pneumonia) 04/09/2019   Small vessel disease, cerebrovascular 02/28/2016   Cerebral atrophy (HCC) 02/28/2016   Hyponatremia 02/27/2016   Syncope 04/29/2014   TIA (transient ischemic attack) 04/29/2014   HTN (hypertension) 04/29/2014   HLD (hyperlipidemia) 04/29/2014   Left knee pain 04/29/2014    Past Surgical History:  Procedure Laterality Date   ABDOMINAL HYSTERECTOMY     TONSILLECTOMY       OB History   No obstetric history on file.     Family History  Problem Relation Age of Onset   Hypertension Mother    Hyperlipidemia Father    Hypertension Father     Social History   Tobacco Use   Smoking status: Former    Years: 20.00    Types: Cigarettes    Quit date: 04/30/1963    Years since quitting: 58.2   Smokeless tobacco: Never  Substance Use Topics   Alcohol use: No   Drug use: No    Home  Medications Prior to Admission medications   Medication Sig Start Date End Date Taking? Authorizing Provider  acetaminophen (TYLENOL) 500 MG tablet Take 1,000 mg by mouth 2 (two) times daily.    [provider]  amLODipine (NORVASC) 2.5 MG tablet Take 2.5 mg by mouth daily.    [provider]  aspirin 81 MG chewable tablet Chew 81 mg by mouth daily at 3 pm.     [provider]  carvedilol (COREG) 12.5 MG tablet Take 12.5-25 mg by mouth See admin instructions. Take 2 tablets in the AM and 1 tablet in the PM. 02/07/19   [provider]  lisinopril (PRINIVIL,ZESTRIL) 20 MG tablet Take 20 mg by mouth at bedtime.     [provider]  lovastatin (MEVACOR) 20 MG tablet Take 20 mg by mouth daily with supper.     [provider]  omeprazole (PRILOSEC) 40 MG capsule Take 40 mg by mouth daily. 03/01/19   [provider]    Allergies    Sulfa antibiotics  Review of Systems   Review of Systems  Neurological:  Positive for dizziness.  All other systems reviewed and are negative.  Physical Exam Updated Vital Signs BP 136/67 (BP Location: Right Arm)   Pulse 71   Temp 98.6 F (37 C) (Oral)  Resp 15   Ht 5\' 3"  (1.6 m)   Wt 73 kg   SpO2 96%   BMI 28.52 kg/m   Physical Exam Vitals and nursing note reviewed.  Constitutional:      Appearance: She is well-developed.  HENT:     Head: Normocephalic and atraumatic.     Nose: Nose normal. No congestion or rhinorrhea.     Mouth/Throat:     Mouth: Mucous membranes are moist.     Pharynx: Oropharynx is clear.  Eyes:     Pupils: Pupils are equal, round, and reactive to light.  Cardiovascular:     Rate and Rhythm: Normal rate and regular rhythm.  Pulmonary:     Effort: No respiratory distress.     Breath sounds: No stridor.  Abdominal:     General: Abdomen is flat. There is no distension.  Musculoskeletal:     Cervical back: Normal range of motion.  Skin:    General: Skin is warm and  dry.  Neurological:     General: No focal deficit present.     Mental Status: She is alert.     Cranial Nerves: No cranial nerve deficit.     Sensory: Sensation is intact. No sensory deficit.     Motor: Motor function is intact. No weakness.     Coordination: Coordination normal.     Gait: Gait normal.     Deep Tendon Reflexes: Reflexes normal.    ED Results / Procedures / Treatments   Labs (all labs ordered are listed, but only abnormal results are displayed) Labs Reviewed  APTT - Abnormal; Notable for the following components:      Result Value   aPTT 38 (*)    All other components within normal limits  CBC - Abnormal; Notable for the following components:   RBC 3.78 (*)    Hemoglobin 11.5 (*)    All other components within normal limits  DIFFERENTIAL - Abnormal; Notable for the following components:   Monocytes Absolute 1.1 (*)    All other components within normal limits  COMPREHENSIVE METABOLIC PANEL - Abnormal; Notable for the following components:   Sodium 129 (*)    Chloride 91 (*)    Glucose, Bld 111 (*)    BUN 25 (*)    Albumin 3.3 (*)    All other components within normal limits  RESP PANEL BY RT-PCR (FLU A&B, COVID) ARPGX2  ETHANOL  PROTIME-INR  RAPID URINE DRUG SCREEN, HOSP PERFORMED  URINALYSIS, ROUTINE W REFLEX MICROSCOPIC  I-STAT CHEM 8, ED    EKG EKG Interpretation  Date/Time:  Saturday July 23 2021 02:26:05 EDT Ventricular Rate:  77 PR Interval:  170 QRS Duration: 95 QT Interval:  390 QTC Calculation: 442 R Axis:   22 Text Interpretation: Unknown rhythm, irregular rate Confirmed by Marily MemosMesner, Kelilah Hebard [phone]([id number]) on 07/23/2021 6:21:17 AM  Radiology CT Angio Head W or Wo Contrast  Addendum Date: 07/23/2021   ADDENDUM REPORT: 07/23/2021 08:58 ADDENDUM: Upon re-review of this CTA performed at 0503 hours today, the following findings are noted: 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 But with patent left ophthalmic artery confirmed just proximal to this lesion. This addition was discussed by telephone with Dr Lorre NickAnthony Allen on 07/23/2021 at 0846 hours. We discussed that given patient age and the small size of this lesion, surveillance vessel imaging (e.g. annual with either MRA Head without contrast, or repeat CTA with  contrast) may suffice. Electronically Signed   By: Odessa Fleming M.D.   On: 07/23/2021 08:58   Result Date: 07/23/2021 CLINICAL DATA:  87 year old female with dizziness and blurred vision. EXAM: CT ANGIOGRAPHY HEAD TECHNIQUE: Multidetector CT imaging of the head was performed using the standard protocol during bolus administration of intravenous contrast. Multiplanar CT image reconstructions and MIPs were obtained to evaluate the vascular anatomy. CONTRAST:  67mL OMNIPAQUE IOHEXOL 350 MG/ML SOLN COMPARISON:  Brain MRI 03/23/2016.  Head CT 03/08/2016. FINDINGS: CT HEAD Brain: Cerebral volume is stable and within normal limits for age. No midline shift, ventriculomegaly, mass effect, evidence of mass lesion, intracranial hemorrhage or evidence of cortically based acute infarction. Chronic small dystrophic calcifications in the right cerebellum are stable. A small area of chronic white matter hypodensity in the inferior right frontal gyrus is stable. Gray-white matter differentiation otherwise within normal limits for age. Calvarium and skull base: No acute osseous abnormality identified. Paranasal sinuses: Subtotal new right sphenoid sinus mucosal thickening and opacification. Chronic left maxillary retention cysts. Other Visualized paranasal sinuses and mastoids are stable and well aerated. Tympanic cavities remain clear. Orbits: Postoperative changes to both globes since 2017. Visualized scalp soft tissues are within normal limits. CTA HEAD Posterior circulation: Codominant distal vertebral arteries are patent to the basilar. No distal vertebral plaque or stenosis.  Normal PICA origins. Patent basilar artery without stenosis. Normal SCA and PCA origins. Left posterior communicating artery is present and the right is diminutive or absent. Left PCA branches are within normal limits. There is mild right PCA P2 irregularity and stenosis on series 15, image 20. Anterior circulation: Distal cervical ICAs are patent, the right is tortuous. Both ICA siphons are patent. Bilateral distal siphon calcified plaque. No significant stenosis on the left. Normal left ophthalmic and posterior communicating artery origins. On the right there is moderate anterior genu and proximal supraclinoid stenosis. Normal right ophthalmic artery origin. Patent carotid termini, MCA and ACA origins. Anterior communicating artery and bilateral ACA branches are within normal limits. Left MCA M1 segment and trifurcation are patent without stenosis. Right MCA M1 segment and bifurcation are patent without stenosis. Bilateral MCA branches are within normal limits. Venous sinuses: Early contrast timing. The superior sagittal sinus appears to be patent. Anatomic variants: None. Review of the MIP images confirms the above findings IMPRESSION: 1. Intracranial CTA is negative for large vessel occlusion. There is moderate stenosis of the Right ICA siphon due to calcified plaque. Mild right PCA P2 segment atherosclerosis. 2. No acute intracranial abnormality. Stable CT appearance of the brain since 2017. 3. Right sphenoid sinus inflammation is new since 2017. Electronically Signed: By: Odessa Fleming M.D. On: 07/23/2021 05:16   DG Chest Portable 1 View  Result Date: 07/23/2021 CLINICAL DATA:  Dizziness and blurry vision. EXAM: PORTABLE CHEST 1 VIEW COMPARISON:  Chest radiograph dated 04/09/2019. FINDINGS: Bilateral mid to lower lung field hazy densities may represent atelectasis or infiltrate. There is no pleural effusion pneumothorax. Stable cardiac silhouette. Atherosclerotic calcification the aorta. No acute osseous  pathology. IMPRESSION: Bilateral mid to lower lung field atelectasis or infiltrate. Electronically Signed   By: Elgie Collard M.D.   On: 07/23/2021 03:07    Procedures Procedures   Medications Ordered in ED Medications  iohexol (OMNIPAQUE) 350 MG/ML injection 75 mL (75 mLs Intravenous Contrast Given 07/23/21 0507)    ED Course  I have reviewed the triage vital signs and the nursing notes.  Pertinent labs & imaging results that were available during my care  of the patient were reviewed by me and considered in my medical decision making (see chart for details).    MDM Rules/Calculators/A&P                         Possible TIA however seems more peripheral than central.  CT scan normal.  Labs as above.  Slightly low sodium but not the point where should be symptomatic.  She also has some lung findings however she is not hypoxic, tachypneic, coughing, febrile or any other symptoms to suggest that she might have pneumonia.  Patient was observed in the ER for over 5 hours and consistently stable.  Discharged in stable condition after questions were answered for her and her daughter-in-law.  Final Clinical Impression(s) / ED Diagnoses Final diagnoses:  Lightheadedness    Rx / DC Orders ED Discharge Orders     None        Viyaan Champine, Barbara Cower, MD 07/23/21 [phone]

## 2021-07-25 DIAGNOSIS — R42 Dizziness and giddiness: Secondary | ICD-10-CM | POA: Diagnosis not present

## 2021-07-25 DIAGNOSIS — I10 Essential (primary) hypertension: Secondary | ICD-10-CM | POA: Diagnosis not present

## 2021-07-27 ENCOUNTER — Observation Stay (HOSPITAL_COMMUNITY): Payer: Medicare Other

## 2021-07-27 ENCOUNTER — Other Ambulatory Visit: Payer: Self-pay

## 2021-07-27 ENCOUNTER — Emergency Department (HOSPITAL_COMMUNITY): Payer: Medicare Other

## 2021-07-27 ENCOUNTER — Inpatient Hospital Stay (HOSPITAL_COMMUNITY)
Admission: EM | Admit: 2021-07-27 | Discharge: 2021-07-30 | DRG: 065 | Disposition: A | Discharge status: Skilled Nursing Facility | Payer: Medicare Other | Attending: Internal Medicine | Admitting: Internal Medicine

## 2021-07-27 DIAGNOSIS — G319 Degenerative disease of nervous system, unspecified: Secondary | ICD-10-CM | POA: Diagnosis not present

## 2021-07-27 DIAGNOSIS — Z87440 Personal history of urinary (tract) infections: Secondary | ICD-10-CM

## 2021-07-27 DIAGNOSIS — R29898 Other symptoms and signs involving the musculoskeletal system: Secondary | ICD-10-CM | POA: Diagnosis not present

## 2021-07-27 DIAGNOSIS — G8324 Monoplegia of upper limb affecting left nondominant side: Secondary | ICD-10-CM | POA: Diagnosis present

## 2021-07-27 DIAGNOSIS — M179 Osteoarthritis of knee, unspecified: Secondary | ICD-10-CM | POA: Diagnosis present

## 2021-07-27 DIAGNOSIS — Z87891 Personal history of nicotine dependence: Secondary | ICD-10-CM

## 2021-07-27 DIAGNOSIS — E669 Obesity, unspecified: Secondary | ICD-10-CM | POA: Diagnosis present

## 2021-07-27 DIAGNOSIS — G9389 Other specified disorders of brain: Secondary | ICD-10-CM | POA: Diagnosis not present

## 2021-07-27 DIAGNOSIS — I639 Cerebral infarction, unspecified: Secondary | ICD-10-CM | POA: Diagnosis not present

## 2021-07-27 DIAGNOSIS — R29701 NIHSS score 1: Secondary | ICD-10-CM | POA: Diagnosis not present

## 2021-07-27 DIAGNOSIS — R200 Anesthesia of skin: Secondary | ICD-10-CM | POA: Diagnosis not present

## 2021-07-27 DIAGNOSIS — Z20822 Contact with and (suspected) exposure to covid-19: Secondary | ICD-10-CM | POA: Diagnosis not present

## 2021-07-27 DIAGNOSIS — M5001 Cervical disc disorder with myelopathy,  high cervical region: Secondary | ICD-10-CM | POA: Diagnosis not present

## 2021-07-27 DIAGNOSIS — Z6829 Body mass index (BMI) 29.0-29.9, adult: Secondary | ICD-10-CM

## 2021-07-27 DIAGNOSIS — D539 Nutritional anemia, unspecified: Secondary | ICD-10-CM | POA: Diagnosis present

## 2021-07-27 DIAGNOSIS — H538 Other visual disturbances: Secondary | ICD-10-CM | POA: Diagnosis present

## 2021-07-27 DIAGNOSIS — Z8701 Personal history of pneumonia (recurrent): Secondary | ICD-10-CM

## 2021-07-27 DIAGNOSIS — Z83438 Family history of other disorder of lipoprotein metabolism and other lipidemia: Secondary | ICD-10-CM

## 2021-07-27 DIAGNOSIS — R29818 Other symptoms and signs involving the nervous system: Secondary | ICD-10-CM | POA: Diagnosis not present

## 2021-07-27 DIAGNOSIS — Z974 Presence of external hearing-aid: Secondary | ICD-10-CM

## 2021-07-27 DIAGNOSIS — I720 Aneurysm of carotid artery: Secondary | ICD-10-CM | POA: Diagnosis not present

## 2021-07-27 DIAGNOSIS — M170 Bilateral primary osteoarthritis of knee: Secondary | ICD-10-CM | POA: Diagnosis present

## 2021-07-27 DIAGNOSIS — Z91048 Other nonmedicinal substance allergy status: Secondary | ICD-10-CM

## 2021-07-27 DIAGNOSIS — M1710 Unilateral primary osteoarthritis, unspecified knee: Secondary | ICD-10-CM | POA: Diagnosis present

## 2021-07-27 DIAGNOSIS — E785 Hyperlipidemia, unspecified: Secondary | ICD-10-CM | POA: Diagnosis present

## 2021-07-27 DIAGNOSIS — I10 Essential (primary) hypertension: Secondary | ICD-10-CM | POA: Diagnosis present

## 2021-07-27 DIAGNOSIS — K219 Gastro-esophageal reflux disease without esophagitis: Secondary | ICD-10-CM | POA: Diagnosis present

## 2021-07-27 DIAGNOSIS — Z882 Allergy status to sulfonamides status: Secondary | ICD-10-CM

## 2021-07-27 DIAGNOSIS — Z90710 Acquired absence of both cervix and uterus: Secondary | ICD-10-CM

## 2021-07-27 DIAGNOSIS — D649 Anemia, unspecified: Secondary | ICD-10-CM | POA: Diagnosis present

## 2021-07-27 DIAGNOSIS — H9193 Unspecified hearing loss, bilateral: Secondary | ICD-10-CM | POA: Diagnosis present

## 2021-07-27 DIAGNOSIS — Z8673 Personal history of transient ischemic attack (TIA), and cerebral infarction without residual deficits: Secondary | ICD-10-CM | POA: Diagnosis present

## 2021-07-27 DIAGNOSIS — Z8249 Family history of ischemic heart disease and other diseases of the circulatory system: Secondary | ICD-10-CM

## 2021-07-27 DIAGNOSIS — R001 Bradycardia, unspecified: Secondary | ICD-10-CM | POA: Diagnosis not present

## 2021-07-27 DIAGNOSIS — Z7982 Long term (current) use of aspirin: Secondary | ICD-10-CM

## 2021-07-27 DIAGNOSIS — M5000 Cervical disc disorder with myelopathy, unspecified cervical region: Secondary | ICD-10-CM | POA: Diagnosis present

## 2021-07-27 DIAGNOSIS — Z79899 Other long term (current) drug therapy: Secondary | ICD-10-CM

## 2021-07-27 LAB — URINALYSIS, ROUTINE W REFLEX MICROSCOPIC
Bilirubin Urine: NEGATIVE
Glucose, UA: 50 mg/dL — AB
Hgb urine dipstick: NEGATIVE
Ketones, ur: NEGATIVE mg/dL
Leukocytes,Ua: NEGATIVE
Nitrite: NEGATIVE
Protein, ur: NEGATIVE mg/dL
Specific Gravity, Urine: 1.005 (ref 1.005–1.030)
pH: 7 (ref 5.0–8.0)

## 2021-07-27 LAB — COMPREHENSIVE METABOLIC PANEL
ALT: 12 U/L (ref 0–44)
AST: 15 U/L (ref 15–41)
Albumin: 2.9 g/dL — ABNORMAL LOW (ref 3.5–5.0)
Alkaline Phosphatase: 57 U/L (ref 38–126)
Anion gap: 7 (ref 5–15)
BUN: 33 mg/dL — ABNORMAL HIGH (ref 8–23)
CO2: 23 mmol/L (ref 22–32)
Calcium: 8.2 mg/dL — ABNORMAL LOW (ref 8.9–10.3)
Chloride: 103 mmol/L (ref 98–111)
Creatinine, Ser: 0.82 mg/dL (ref 0.44–1.00)
GFR, Estimated: >60 mL/min (ref >60)
Glucose, Bld: 108 mg/dL — ABNORMAL HIGH (ref 70–99)
Potassium: 3.9 mmol/L (ref 3.5–5.1)
Sodium: 133 mmol/L — ABNORMAL LOW (ref 135–145)
Total Bilirubin: 0.6 mg/dL (ref 0.3–1.2)
Total Protein: 6.1 g/dL — ABNORMAL LOW (ref 6.5–8.1)

## 2021-07-27 LAB — I-STAT CHEM 8, ED
BUN: 31 mg/dL — ABNORMAL HIGH (ref 8–23)
Calcium, Ion: 1.07 mmol/L — ABNORMAL LOW (ref 1.15–1.40)
Chloride: 102 mmol/L (ref 98–111)
Creatinine, Ser: 0.9 mg/dL (ref 0.44–1.00)
Glucose, Bld: 103 mg/dL — ABNORMAL HIGH (ref 70–99)
HCT: 30 % — ABNORMAL LOW (ref 36.0–46.0)
Hemoglobin: 10.2 g/dL — ABNORMAL LOW (ref 12.0–15.0)
Potassium: 3.8 mmol/L (ref 3.5–5.1)
Sodium: 134 mmol/L — ABNORMAL LOW (ref 135–145)
TCO2: 23 mmol/L (ref 22–32)

## 2021-07-27 LAB — DIFFERENTIAL
Abs Immature Granulocytes: 0.05 10*3/uL (ref 0.00–0.07)
Basophils Absolute: 0 10*3/uL (ref 0.0–0.1)
Basophils Relative: 0 %
Eosinophils Absolute: 0.3 10*3/uL (ref 0.0–0.5)
Eosinophils Relative: 4 %
Immature Granulocytes: 1 %
Lymphocytes Relative: 16 %
Lymphs Abs: 1.2 10*3/uL (ref 0.7–4.0)
Monocytes Absolute: 0.9 10*3/uL (ref 0.1–1.0)
Monocytes Relative: 11 %
Neutro Abs: 5.2 10*3/uL (ref 1.7–7.7)
Neutrophils Relative %: 68 %

## 2021-07-27 LAB — PROTIME-INR
INR: 1 (ref 0.8–1.2)
Prothrombin Time: 13.1 seconds (ref 11.4–15.2)

## 2021-07-27 LAB — CBC
HCT: 32.9 % — ABNORMAL LOW (ref 36.0–46.0)
Hemoglobin: 10 g/dL — ABNORMAL LOW (ref 12.0–15.0)
MCH: 30.6 pg (ref 26.0–34.0)
MCHC: 30.4 g/dL (ref 30.0–36.0)
MCV: 100.6 fL — ABNORMAL HIGH (ref 80.0–100.0)
Platelets: 264 10*3/uL (ref 150–400)
RBC: 3.27 MIL/uL — ABNORMAL LOW (ref 3.87–5.11)
RDW: 13.9 % (ref 11.5–15.5)
WBC: 7.6 10*3/uL (ref 4.0–10.5)
nRBC: 0 % (ref 0.0–0.2)

## 2021-07-27 LAB — LIPID PANEL
Cholesterol: 148 mg/dL (ref 0–200)
HDL: 45 mg/dL (ref >40)
LDL Cholesterol: 62 mg/dL (ref 0–99)
Total CHOL/HDL Ratio: 3.3 RATIO
Triglycerides: 205 mg/dL — ABNORMAL HIGH (ref <150)
VLDL: 41 mg/dL — ABNORMAL HIGH (ref 0–40)

## 2021-07-27 LAB — RESP PANEL BY RT-PCR (FLU A&B, COVID) ARPGX2
Influenza A by PCR: NEGATIVE
Influenza B by PCR: NEGATIVE
SARS Coronavirus 2 by RT PCR: NEGATIVE

## 2021-07-27 LAB — CBG MONITORING, ED: Glucose-Capillary: 98 mg/dL (ref 70–99)

## 2021-07-27 LAB — HEMOGLOBIN A1C
Hgb A1c MFr Bld: 6.5 % — ABNORMAL HIGH (ref 4.8–5.6)
Mean Plasma Glucose: 139.85 mg/dL

## 2021-07-27 LAB — APTT: aPTT: 33 seconds (ref 24–36)

## 2021-07-27 MED ORDER — ASPIRIN 81 MG PO CHEW
81.0000 mg | CHEWABLE_TABLET | Freq: Every day | ORAL | Status: DC
  Administered 2021-07-27 – 2021-07-29 (×3): 81 mg via ORAL
  Filled 2021-07-27 (×3): qty 1

## 2021-07-27 MED ORDER — SODIUM CHLORIDE 0.9% FLUSH: 3.0000 mL | INTRAVENOUS | Status: DC | PRN

## 2021-07-27 MED ORDER — METOPROLOL TARTRATE 5 MG/5ML IV SOLN: 5.0000 mg | Freq: Four times a day (QID) | INTRAVENOUS | Status: DC | PRN

## 2021-07-27 MED ORDER — ENOXAPARIN SODIUM 40 MG/0.4ML IJ SOSY
40.0000 mg | PREFILLED_SYRINGE | INTRAMUSCULAR | Status: DC
  Administered 2021-07-27 – 2021-07-29 (×3): 40 mg via SUBCUTANEOUS
  Filled 2021-07-27 (×3): qty 0.4

## 2021-07-27 MED ORDER — ACETAMINOPHEN 325 MG PO TABS
650.0000 mg | ORAL_TABLET | Freq: Three times a day (TID) | ORAL | Status: DC
  Administered 2021-07-27 – 2021-07-29 (×7): 650 mg via ORAL
  Filled 2021-07-27 (×9): qty 2

## 2021-07-27 MED ORDER — PANTOPRAZOLE SODIUM 40 MG PO TBEC
80.0000 mg | DELAYED_RELEASE_TABLET | Freq: Every day | ORAL | Status: DC
  Administered 2021-07-28 – 2021-07-29 (×2): 80 mg via ORAL
  Filled 2021-07-27 (×2): qty 2

## 2021-07-27 MED ORDER — SODIUM CHLORIDE 0.9 % IV SOLN: 250.0000 mL | INTRAVENOUS | Status: DC | PRN

## 2021-07-27 MED ORDER — PRAVASTATIN SODIUM 40 MG PO TABS
20.0000 mg | ORAL_TABLET | Freq: Every day | ORAL | Status: DC
  Administered 2021-07-28 – 2021-07-29 (×2): 20 mg via ORAL
  Filled 2021-07-27 (×2): qty 1

## 2021-07-27 MED ORDER — SODIUM CHLORIDE 0.9% FLUSH: 3.0000 mL | Freq: Once | INTRAVENOUS | Status: DC

## 2021-07-27 MED ORDER — STROKE: EARLY STAGES OF RECOVERY BOOK
Freq: Once | Status: AC
  Filled 2021-07-27: qty 1

## 2021-07-27 MED ORDER — SODIUM CHLORIDE 0.9% FLUSH
3.0000 mL | Freq: Two times a day (BID) | INTRAVENOUS | Status: DC
  Administered 2021-07-28 (×3): 3 mL via INTRAVENOUS

## 2021-07-27 MED ORDER — SENNOSIDES-DOCUSATE SODIUM 8.6-50 MG PO TABS: 1.0000 | ORAL_TABLET | Freq: Every evening | ORAL | Status: DC | PRN

## 2021-07-27 MED ORDER — DICLOFENAC SODIUM 1 % EX GEL
4.0000 g | Freq: Four times a day (QID) | CUTANEOUS | Status: DC
  Administered 2021-07-28 – 2021-07-29 (×6): 4 g via TOPICAL
  Filled 2021-07-27: qty 100

## 2021-07-27 MED ORDER — SODIUM CHLORIDE 0.9 % IV BOLUS
1000.0000 mL | Freq: Once | INTRAVENOUS | Status: AC
  Administered 2021-07-27: 1000 mL via INTRAVENOUS

## 2021-07-27 NOTE — ED Notes (Signed)
Pt reports a slight numbness from an old stroke in her lt leg otherwise movement is ok  Pain from arthritis

## 2021-07-27 NOTE — ED Notes (Signed)
Report given to brenda on 3w

## 2021-07-27 NOTE — ED Notes (Signed)
Report attempted unsuccessful  Will call me back 

## 2021-07-27 NOTE — Consult Note (Signed)
Reason for Consult: Code stroke Referring Physician: Dr. [name] is an 87 y.o. female.  HPI: Jill Hernandez is a 87 year old pleasant Caucasian lady with past medical history of hypertension, hyperlipidemia and multiple TIAs and gastroesophageal flux disease.  She lives in independent living facility and states that she had sudden onset of left upper extremity numbness and EMS was called and called a code stroke.  The patient in fact states that she been having symptoms off and on for the last 2 to 4 days.  Her daughter who arrived later corroborated her story that she has been complaining of dizziness, bilateral blurred vision as well as some numbness in the left side off and on.  She was in fact evaluated in the ER for the symptoms on 07/23/2021 and had noncontrast CT scan of the head which showed age-related atrophy and no acute abnormalities.  CT angiogram was also obtained which showed no large vessel occlusion and showed moderate stenosis of the right ICA siphon due to calcified plaque and mild right P2 atherosclerosis.  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  The patient was seen by me upon arrival and complained of left upper extremity numbness and heaviness but her NIH stroke scale was positive only for this and she had no other deficits.  Emergent CT scan of the head was obtained which showed aspect score of 10 with no acute abnormality.  Patient has also been having urinary tract infection please complete her course of antibiotics but she continues to have polyuria and incontinence. Last seen normal 1430 hrs. IV thrombolysis considered no deficits to mild and symptoms ongoing for several days Thrombectomy considered no as clinically does not meet criteria for LVO NIH stroke scale on arrival 1 Premorbid modified Rankin score 1 Past Medical History:  Diagnosis Date   GERD (gastroesophageal reflux disease)    Hyperlipemia    Hypertension    TIA  (transient ischemic attack)     Past Surgical History:  Procedure Laterality Date   ABDOMINAL HYSTERECTOMY     TONSILLECTOMY      Family History  Problem Relation Age of Onset   Hypertension Mother    Hyperlipidemia Father    Hypertension Father     Social History:  reports that she quit smoking about 58 years ago. Her smoking use included cigarettes. She has never used smokeless tobacco. She reports that she does not drink alcohol and does not use drugs.  Allergies:  Allergies  Allergen Reactions   Tape Other (See Comments)    SKIN IS THIN- WILL BRUISE AND TEAR EASILY!!   Sulfa Antibiotics Hives    Medications: I have reviewed the patient's current medications.  Results for orders placed or performed during the hospital encounter of 07/27/21 (from the past 48 hour(s))  CBG monitoring, ED     Status: None   Collection Time: 07/27/21  3:30 PM  Result Value Ref Range   Glucose-Capillary 98 70 - 99 mg/dL    Comment: Glucose reference range applies only to samples taken after fasting for at least 8 hours.  Protime-INR     Status: None   Collection Time: 07/27/21  3:30 PM  Result Value Ref Range   Prothrombin Time 13.1 11.4 - 15.2 seconds   INR 1.0 0.8 - 1.2    Comment: (NOTE) INR goal varies based on device and disease states. Performed at [facility], [address] [place], [place] [address]   APTT  Status: None   Collection Time: 07/27/21  3:30 PM  Result Value Ref Range   aPTT 33 24 - 36 seconds    Comment: Performed at [facility], [address], [place], [place] [address]  CBC     Status: Abnormal   Collection Time: 07/27/21  3:30 PM  Result Value Ref Range   WBC 7.6 4.0 - 10.5 K/uL   RBC 3.27 (L) 3.87 - 5.11 MIL/uL   Hemoglobin 10.0 (L) 12.0 - 15.0 g/dL   HCT 30.1 (L) 60.1 - 09.3 %   MCV 100.6 (H) 80.0 - 100.0 fL   MCH 30.6 26.0 - 34.0 pg   MCHC 30.4 30.0 - 36.0 g/dL   RDW 23.5 57.3 - 22.0 %   Platelets 264 150 - 400 K/uL   nRBC 0.0  0.0 - 0.2 %    Comment: Performed at [facility], [address] [place], [place] [address]  Differential     Status: None   Collection Time: 07/27/21  3:30 PM  Result Value Ref Range   Neutrophils Relative % 68 %   Neutro Abs 5.2 1.7 - 7.7 K/uL   Lymphocytes Relative 16 %   Lymphs Abs 1.2 0.7 - 4.0 K/uL   Monocytes Relative 11 %   Monocytes Absolute 0.9 0.1 - 1.0 K/uL   Eosinophils Relative 4 %   Eosinophils Absolute 0.3 0.0 - 0.5 K/uL   Basophils Relative 0 %   Basophils Absolute 0.0 0.0 - 0.1 K/uL   Immature Granulocytes 1 %   Abs Immature Granulocytes 0.05 0.00 - 0.07 K/uL    Comment: Performed at [facility], [address], [place], [place] [address]  Comprehensive metabolic panel     Status: Abnormal   Collection Time: 07/27/21  3:30 PM  Result Value Ref Range   Sodium 133 (L) 135 - 145 mmol/L   Potassium 3.9 3.5 - 5.1 mmol/L   Chloride 103 98 - 111 mmol/L   CO2 23 22 - 32 mmol/L   Glucose, Bld 108 (H) 70 - 99 mg/dL    Comment: Glucose reference range applies only to samples taken after fasting for at least 8 hours.   BUN 33 (H) 8 - 23 mg/dL   Creatinine, Ser 6.28 0.44 - 1.00 mg/dL   Calcium 8.2 (L) 8.9 - 10.3 mg/dL   Total Protein 6.1 (L) 6.5 - 8.1 g/dL   Albumin 2.9 (L) 3.5 - 5.0 g/dL   AST 15 15 - 41 U/L   ALT 12 0 - 44 U/L   Alkaline Phosphatase 57 38 - 126 U/L   Total Bilirubin 0.6 0.3 - 1.2 mg/dL   GFR, Estimated >31 >51 mL/min    Comment: (NOTE) Calculated using the CKD-EPI Creatinine Equation (2021)    Anion gap 7 5 - 15    Comment: Performed at [facility], [address], [place], [place] [address]  I-stat chem 8, ED     Status: Abnormal   Collection Time: 07/27/21  3:35 PM  Result Value Ref Range   Sodium 134 (L) 135 - 145 mmol/L   Potassium 3.8 3.5 - 5.1 mmol/L   Chloride 102 98 - 111 mmol/L   BUN 31 (H) 8 - 23 mg/dL   Creatinine, Ser 7.37 0.44 - 1.00 mg/dL   Glucose, Bld 106 (H) 70 - 99 mg/dL    Comment: Glucose reference  range applies only to samples taken after fasting for at least 8 hours.  Calcium, Ion 1.07 (L) 1.15 - 1.40 mmol/L   TCO2 23 22 - 32 mmol/L   Hemoglobin 10.2 (L) 12.0 - 15.0 g/dL   HCT 80.9 (L) 98.3 - 38.2 %    CT HEAD CODE STROKE WO CONTRAST  Result Date: 07/27/2021 CLINICAL DATA:  Code stroke. Acute neuro deficit. Rule out stroke. Left-sided weakness EXAM: CT HEAD WITHOUT CONTRAST TECHNIQUE: Contiguous axial images were obtained from the base of the skull through the vertex without intravenous contrast. COMPARISON:  CT head 07/23/2021 FINDINGS: Brain: No evidence of acute infarction, hemorrhage, hydrocephalus, extra-axial collection or mass lesion/mass effect. Mild white matter changes most likely due to chronic microvascular ischemic change. Vascular: Negative for hyperdense vessel Skull: Negative Sinuses/Orbits: Extensive mucosal edema right sphenoid sinus unchanged. Mild mucosal edema maxillary sinus bilaterally. Bilateral cataract extraction. No orbital mass. Other: None ASPECTS (Alberta Stroke Program Early CT Score) - Ganglionic level infarction (caudate, lentiform nuclei, internal capsule, insula, M1-M3 cortex): 7 - Supraganglionic infarction (M4-M6 cortex): 3 Total score (0-10 with 10 being normal): 10 IMPRESSION: 1. No acute intracranial abnormality 2. ASPECTS is 10 3. Code stroke imaging results were communicated on 07/27/2021 at 3:45 pm to provider Jill Hernandez via text page Electronically Signed   By: Marlan Palau M.D.   On: 07/27/2021 15:46       ROS positive for numbness, blurred vision, dizziness, Weight 76.2 kg. Physical Exam Frail elderly Caucasian lady not in distress. . Afebrile. Head is nontraumatic. Neck is supple without bruit.    Cardiac exam no murmur or gallop. Lungs are clear to auscultation. Distal pulses are well felt.  Neurological Exam ;  Awake  Alert oriented x 3. Normal speech and language.eye movements full without nystagmus.fundi were not visualized. Vision acuity and  fields appear normal. Hearing is normal. Palatal movements are normal. Face symmetric. Tongue midline. Normal strength, tone, reflexes and coordination. Normal sensation.  Except left upper extremity subjective numbness.  Gait deferred.  Assessment/Plan: 87 year old lady with sudden onset of left upper extremity numbness and heaviness with history of similar recurrent symptoms along with dizziness and blurred vision ongoing for the last 3 to 5 days.  Negative work-up for LVO recent CT angiogram for above complaints on 07/23/2021.  Patient not a candidate for tPA due to minimum mild nondisabling deficits and fluctuating symptoms for several days.  She is not a candidate for thrombectomy as clinical symptoms are not suggestive of LVO Recommend admission to medical team for further work-up due to ongoing and fluctuating symptoms.  Check MRI scan of the brain.  If negative may consider an MRI scan of the cervical spine to look for cervical compressive radiculopathy.  Check UA and cultures.  IV hydration. Continue aspirin for stroke prevention, check lipid profile, hemoglobin A1c.  Aggressive risk factor modification. Long discussion with patient and her daughter at the bedside and answered questions.  Discussed with Dr. Particia Nearing Stroke team will follow. Jill Hernandez 07/27/2021, 4:55 PM    Note: This document was prepared with digital dictation and possible smart phrase technology. Any transcriptional errors that result from this process are unintentional.

## 2021-07-27 NOTE — ED Notes (Signed)
Pt returned from mri

## 2021-07-27 NOTE — H&P (Signed)
History and Physical    Jill Hernandez DOB: 1930-04-18 DOA: 07/27/2021  PCP: Jarrett Soho, PA-C Consultants:  none Patient coming from:  independent living facility: [facility]    Chief Complaint: bilateral blurred vision and left sided intermittent numbness  HPI: Jill Hernandez is a 87 y.o. female with medical history significant of HTN, HLD, multiple TIAs, GERD who presented with episode of left sided intermittent numbness, weakness, vision changes.   She was seen by her pcp on Monday for hospital f/u visit. When her daughter picked her up on Monday she was out of it. She couldn't sit up straight, she wasn't talking normal, she said her left side felt numb and she had dizziness and blurred vision. Her left leg also was not moving as well as it should. She feels like she was going to fall. She felt better while seeing her pcp so went home.  She was fine Tuesday and her normal self.  Today when her daughter got to her house she was feeling weird again. She got there around 2pm. She had the same symptoms as Monday and a nurse was there and they took her blood pressure. It was high and they called 911.   She denies any fever/chills, chest pain, shortness of breath, leg swelling, skin lesions, N/V/D, headaches. Had a UTI 2 weeks ago and finished a course of antibiotics, unsure what she was on.   She has had some lightheadedness, feeling hot in her face and her eyes feel heavy.    Seen in ED on 07/23/21 for blurred vision. Had normal CT head scan. Discharged in stable condition.    ED Course: vitals: afebrile, bp: 151/69, HR: 80, RR: 19, oxygen: 100% RA Pertinent labs: hgb: 10.0 (9.7-11.5), sodium: 133, albumin: 2.9, CTH: negative. Aspects score of 10. Given 1L NS bolus, made NPO and code stroke called. Neurology following, recommended MRI brain. We were asked to admit.   Review of Systems: As per HPI; otherwise review of systems reviewed and negative.   Ambulatory  Status:  Ambulates with walker    Past Medical History:  Diagnosis Date   GERD (gastroesophageal reflux disease)    Hyperlipemia    Hypertension    TIA (transient ischemic attack)     Past Surgical History:  Procedure Laterality Date   ABDOMINAL HYSTERECTOMY     TONSILLECTOMY      Social History   Socioeconomic History   Marital status: Married    Spouse name: Not on file   Number of children: Not on file   Years of education: Not on file   Highest education level: Not on file  Occupational History   Not on file  Tobacco Use   Smoking status: Former    Years: 20.00    Types: Cigarettes    Quit date: 04/30/1963    Years since quitting: 58.2   Smokeless tobacco: Never  Substance and Sexual Activity   Alcohol use: No   Drug use: No   Sexual activity: Not on file  Other Topics Concern   Not on file  Social History Narrative   Not on file   Social Determinants of Health   Financial Resource Strain: Not on file  Food Insecurity: Not on file  Transportation Needs: Not on file  Physical Activity: Not on file  Stress: Not on file  Social Connections: Not on file  Intimate Partner Violence: Not on file    Allergies  Allergen Reactions   Tape Other (See Comments)  SKIN IS THIN- WILL BRUISE AND TEAR EASILY!!   Sulfa Antibiotics Hives    Family History  Problem Relation Age of Onset   Hypertension Mother    Hyperlipidemia Father    Hypertension Father     Prior to Admission medications   Medication Sig Start Date End Date Taking? Authorizing Provider  acetaminophen (TYLENOL) 650 MG CR tablet Take 1,300 mg by mouth every 8 (eight) hours.   Yes [provider]  amLODipine (NORVASC) 2.5 MG tablet Take 2.5 mg by mouth daily in the afternoon.   Yes [provider]  ASPERCREME ARTHRITIS PAIN 1 % GEL Apply 2-4 g topically 4 (four) times daily as needed (for pain).   Yes [provider]  aspirin 81 MG chewable tablet Chew 81 mg by mouth  daily at 3 pm.    Yes [provider]  BIOFREEZE 4 % GEL Apply 1 application topically 2 (two) times daily as needed (to painful areas).   Yes [provider]  carvedilol (COREG) 12.5 MG tablet Take 12.5 mg by mouth 2 (two) times daily with a meal. 02/07/19  Yes [provider]  lovastatin (MEVACOR) 20 MG tablet Take 20 mg by mouth daily after supper.   Yes [provider]  omeprazole (PRILOSEC) 40 MG capsule Take 40 mg by mouth daily before breakfast. 03/01/19  Yes [provider]    Physical Exam: Vitals:   07/27/21 1915 07/27/21 1930 07/27/21 1945 07/27/21 2000  BP: (!) 163/67 (!) 152/65 (!) 141/96 (!) 159/89  Pulse: 77 77 77 84  Resp: 18 17 15 18   Temp:      TempSrc:      SpO2: 97% 98% 97% 97%  Weight:         General:  Appears calm and comfortable and is in NAD Eyes:  PERRL, EOMI, normal lids, iris ENT:  hard of hearing,  lips & tongue, mmm; no teeth  Neck:  no LAD, masses or thyromegaly; no carotid bruits Cardiovascular:  RRR, no m/r/g. No LE edema.  Respiratory:   CTA bilaterally with no wheezes/rales/rhonchi.  Normal respiratory effort. Abdomen:  soft, NT, ND, NABS Back:   normal alignment, no CVAT Skin:  no rash or induration seen on limited exam Musculoskeletal:  grossly normal tone BUE/BLE: 5/5 bilaterally. she does have slightly decreased strength in her left foot. good ROM, no bony abnormality Lower extremity:  No LE edema.  Limited foot exam with no ulcerations.  2+ distal pulses. Psychiatric:  grossly normal mood and affect, speech fluent and appropriate, AOx3 Neurologic:  CN 2-12 grossly intact, moves all extremities in coordinated fashion, sensation intact. Negative pronator drift. Gait deferred     Radiological Exams on Admission: Independently reviewed - see discussion in A/P where applicable  MR BRAIN WO CONTRAST  Result Date: 07/27/2021 CLINICAL DATA:  Neuro deficit, acute, stroke suspected EXAM: MRI HEAD WITHOUT  CONTRAST TECHNIQUE: Multiplanar, multiecho pulse sequences of the brain and surrounding structures were obtained without intravenous contrast. COMPARISON:  Same-day CT head.  MRI February 27, 2016. FINDINGS: Motion limited study.  Within this limitation: Brain: Multiple small areas mild DWI hyperintensity in the high right frontal lobe (series 2, images 34-37). At least one of these lesions appears to have correlate ADC hypointensity (series 250, image 36) and therefore likely represents a recent infarct. The other areas are either hyperintense or isointense on ADC and may represent subacute infarcts or artifactual T2 shine through. Mild T2 hyperintensity in these areas without mass effect.  Additional mild for age scattered T2 hyperintensities in the supratentorial and pontine white matter, nonspecific but compatible with chronic microvascular ischemic disease. Small remote right cerebellar lacunar infarct. Mild for age atrophy. Vascular: Major arterial flow voids are maintained skull base. Skull and upper cervical spine: Normal marrow signal. Partially imaged upper cervical degenerative change with at least mild-to-moderate canal stenosis. Sinuses/Orbits: Mild paranasal sinus mucosal thickening. Left maxillary sinus retention cyst. No acute orbital findings. Other: No sizable mastoid effusions. IMPRESSION: Motion limited study. 1. Multiple small areas of mild DWI hyperintensity in the high right frontal lobe. At least one of these lesions appears to have correlate ADC hypointensity, suggestive of a recent infarct. The other areas are either hyperintense or isointense on ADC and may represent subacute infarcts and/or artifactual T2 shine through. 2. Mild for age chronic microvascular ischemic disease and atrophy. 3. Upper cervical spine degenerative change with at least mild-to-moderate canal stenosis. An MRI of the cervical spine could further evaluate if clinically indicated. Electronically Signed   By: Feliberto HartsFrederick S  Jones M.D.   On: 07/27/2021 18:00   CT HEAD CODE STROKE WO CONTRAST  Result Date: 07/27/2021 CLINICAL DATA:  Code stroke. Acute neuro deficit. Rule out stroke. Left-sided weakness EXAM: CT HEAD WITHOUT CONTRAST TECHNIQUE: Contiguous axial images were obtained from the base of the skull through the vertex without intravenous contrast. COMPARISON:  CT head 07/23/2021 FINDINGS: Brain: No evidence of acute infarction, hemorrhage, hydrocephalus, extra-axial collection or mass lesion/mass effect. Mild white matter changes most likely due to chronic microvascular ischemic change. Vascular: Negative for hyperdense vessel Skull: Negative Sinuses/Orbits: Extensive mucosal edema right sphenoid sinus unchanged. Mild mucosal edema maxillary sinus bilaterally. Bilateral cataract extraction. No orbital mass. Other: None ASPECTS ([place]ta Stroke Program Early CT Score) - Ganglionic level infarction (caudate, lentiform nuclei, internal capsule, insula, M1-M3 cortex): 7 - Supraganglionic infarction (M4-M6 cortex): 3 Total score (0-10 with 10 being normal): 10 IMPRESSION: 1. No acute intracranial abnormality 2. ASPECTS is 10 3. Code stroke imaging results were communicated on 07/27/2021 at 3:45 pm to provider Pearlean BrownieSethi via text page Electronically Signed   By: Marlan Palauharles  Clark M.D.   On: 07/27/2021 15:46    EKG: Independently reviewed.  NSR with rate 78; nonspecific ST changes with no evidence of acute ischemia   Labs on Admission: I have personally reviewed the available labs and imaging studies at the time of the admission.  Pertinent labs:  Pertinent labs: hgb: 10.0 (9.7-11.5),  sodium: 133,  albumin: 2.9  Assessment/Plan Principal Problem:   Left sided numbness/acute CVA -concern for stroke with clinical history and exam findings of weakness in left foot. MRI shows new hyperintensity in the high right frontal lobe.  Suggestive of a recent infarct. -neurology consulted and following -brain MRA and carotid US  pending -frequent neuro checks -echo ordered -telemetry  -a1c/lipid panel. Maximize medical therapy  -PT/OT/ST -continue ASA and f/u with neurology anti platelet recommendation  -Allow for permissive hypertension first 24 to 48 hours  Active Problems:   HTN (hypertension) Allow for permissive hypertension in first 24 to 48 hours in setting of acute CVA -Held home Norvasc and Coreg -PRN parameters written for elevated blood pressure    HLD (hyperlipidemia) -Continue lovastatin -Lipid panel pending and need to maximize medical management with goal LDL less than 70    Anemia Around her baseline hemoglobin 9.7-11.5 -no recent  iron studies so these are pending    Knee osteoarthritis -Uses Tylenol as needed and I have ordered this -Uses Biofreeze -  Do Voltaren gel as needed while here    GERD (gastroesophageal reflux disease) -Continue Prilosec daily    Body mass index is 29.76 kg/m.    Level of care: Telemetry Medical DVT prophylaxis:  Lovenox Code Status:  Full - confirmed with patient Family Communication: daughter at bedside: sharon edwards  Disposition Plan:  The patient is from: home  Anticipated d/c is to: independent living facility per PT recommendations  Requires inpatient hospitalization and is at significant risk of neurological worsening, requires constant monitoring, assessment and MDM with specialists.   Patient is currently: acutely ill Consults called: neurology   Admission status:  observation    Orland Mustard MD [facility]  Dragon dictation used during completion of this note.  How to contact the [facility] Attending or Consulting provider 7A - 7P or covering provider during after hours 7P -7A, for this patient?  Check the care team in [facility] and look for a) attending/consulting [facility] provider listed and b) the [facility] team listed Log into [email or web address] and use [facility]'s universal password to access. If you do not have the password, please contact the  hospital operator. Locate the [facility] provider you are looking for under [facility] and page to a number that you can be directly reached. If you still have difficulty reaching the provider, please page the [facility] (Director on Call) for the Hospitalists listed on amion for assistance.   07/27/2021, 9:35 PM

## 2021-07-27 NOTE — Code Documentation (Signed)
Stroke Response Nurse Documentation Code Documentation  Jill Hernandez is a 87 y.o. female arriving to [facility] ED via [facility] on 07/27/21 with past medical hx of hypertension, hyperlipedemia, and transient ischemic attack. Code stroke was activated by EMS. Patient from home where she was LKW at 1430 and now complaining of left arm feeling heavy. She was diagnosed with a UTI 2 weeks ago.   Stroke team at the bedside on patient arrival. Labs drawn and patient cleared for CT by Dr. Particia Nearing. Patient to CT with team. NIHSS 1, see documentation for details and code stroke times. Patient with left decreased sensation on exam.   The following imaging was completed: CT. Daughter reports she has had vision changes intermittently for days and left sided numbness intermittently since Monday 8/29. Patient is not a candidate for tnk due to symptoms have been occurring for days. Care/Plan MRI. Bedside handoff with ED RN Paden.    Ferman Hamming Stroke Response RN

## 2021-07-27 NOTE — ED Triage Notes (Signed)
Pt BIB [facility] d/t stroke like symptoms including left sided numbness. Pt alert and oriented but hard of hearing, pt reports numbness on the left side and that one side feels different than the other.

## 2021-07-27 NOTE — ED Notes (Signed)
Pt asking for biofreeze for her knees  I offered some voltaren which just came up from pharmacy  She wants to wait until she gets upstairs

## 2021-07-27 NOTE — ED Provider Notes (Signed)
[facility] EMERGENCY DEPARTMENT Provider Note   CSN: [id number] Arrival date & time: 07/27/21  1527  An emergency department physician performed an initial assessment on this suspected stroke patient at 89.  History Chief Complaint  Patient presents with   Code Stroke    Jill Hernandez is a 87 y.o. female.  Pt presents to the ED today with a code stroke.  Pt has had intermittent blurry vision and left arm numbness.  Pt was seen in the ED on 8/27 for the feeling of lightheadedness.  The pt had a CT which was nl.  The pt's sx resolved, so she was sent home.  Pt told her daughter she had left arm numbness on 8/29.  The pt did go to her doctor on the 29th, but the numbness resolved.  Today, the numbness was back, so EMS called a code stroke.  Pt's daughter said she's also had some urinary incontinence and has been feeling more tired than normal.      Past Medical History:  Diagnosis Date   GERD (gastroesophageal reflux disease)    Hyperlipemia    Hypertension    TIA (transient ischemic attack)     Patient Active Problem List   Diagnosis Date Noted   Left sided numbness 07/27/2021   CAP (community acquired pneumonia) 04/09/2019   Small vessel disease, cerebrovascular 02/28/2016   Cerebral atrophy (HCC) 02/28/2016   Hyponatremia 02/27/2016   Syncope 04/29/2014   TIA (transient ischemic attack) 04/29/2014   HTN (hypertension) 04/29/2014   HLD (hyperlipidemia) 04/29/2014   Left knee pain 04/29/2014    Past Surgical History:  Procedure Laterality Date   ABDOMINAL HYSTERECTOMY     TONSILLECTOMY       OB History   No obstetric history on file.     Family History  Problem Relation Age of Onset   Hypertension Mother    Hyperlipidemia Father    Hypertension Father     Social History   Tobacco Use   Smoking status: Former    Years: 20.00    Types: Cigarettes    Quit date: 04/30/1963    Years since quitting: 58.2   Smokeless tobacco: Never   Substance Use Topics   Alcohol use: No   Drug use: No    Home Medications Prior to Admission medications   Medication Sig Start Date End Date Taking? Authorizing Provider  acetaminophen (TYLENOL) 650 MG CR tablet Take 1,300 mg by mouth every 8 (eight) hours.   Yes [provider]  amLODipine (NORVASC) 2.5 MG tablet Take 2.5 mg by mouth daily in the afternoon.   Yes [provider]  ASPERCREME ARTHRITIS PAIN 1 % GEL Apply 2-4 g topically 4 (four) times daily as needed (for pain).   Yes [provider]  aspirin 81 MG chewable tablet Chew 81 mg by mouth daily at 3 pm.    Yes [provider]  BIOFREEZE 4 % GEL Apply 1 application topically 2 (two) times daily as needed (to painful areas).   Yes [provider]  carvedilol (COREG) 12.5 MG tablet Take 12.5 mg by mouth 2 (two) times daily with a meal. 02/07/19  Yes [provider]  lovastatin (MEVACOR) 20 MG tablet Take 20 mg by mouth daily after supper.   Yes [provider]  omeprazole (PRILOSEC) 40 MG capsule Take 40 mg by mouth daily before breakfast. 03/01/19  Yes [provider]    Allergies    Tape and Sulfa antibiotics  Review of Systems   Review of Systems  Eyes:  Positive for visual disturbance.  Neurological:  Positive for numbness.  All other systems reviewed and are negative.  Physical Exam Updated Vital Signs BP (!) 160/73   Pulse 78   Temp 98.3 F (36.8 C) (Oral)   Resp 16   Wt 76.2 kg   SpO2 98%   BMI 29.76 kg/m   Physical Exam Vitals and nursing note reviewed.  Constitutional:      Appearance: Normal appearance. She is obese.  HENT:     Head: Normocephalic and atraumatic.     Right Ear: External ear normal.     Left Ear: External ear normal.     Nose: Nose normal.     Mouth/Throat:     Mouth: Mucous membranes are moist.     Pharynx: Oropharynx is clear.  Eyes:     Extraocular Movements: Extraocular movements intact.      Conjunctiva/sclera: Conjunctivae normal.     Pupils: Pupils are equal, round, and reactive to light.  Cardiovascular:     Rate and Rhythm: Normal rate and regular rhythm.     Pulses: Normal pulses.     Heart sounds: Normal heart sounds.  Pulmonary:     Effort: Pulmonary effort is normal.     Breath sounds: Normal breath sounds.  Abdominal:     General: Abdomen is flat. Bowel sounds are normal.     Palpations: Abdomen is soft.  Musculoskeletal:        General: Normal range of motion.     Cervical back: Normal range of motion and neck supple.  Skin:    General: Skin is warm.     Capillary Refill: Capillary refill takes less than 2 seconds.  Neurological:     Mental Status: She is alert.     Comments: Left arm numbness  Psychiatric:        Mood and Affect: Mood normal.    ED Results / Procedures / Treatments   Labs (all labs ordered are listed, but only abnormal results are displayed) Labs Reviewed  CBC - Abnormal; Notable for the following components:      Result Value   RBC 3.27 (*)    Hemoglobin 10.0 (*)    HCT 32.9 (*)    MCV 100.6 (*)    All other components within normal limits  COMPREHENSIVE METABOLIC PANEL - Abnormal; Notable for the following components:   Sodium 133 (*)    Glucose, Bld 108 (*)    BUN 33 (*)    Calcium 8.2 (*)    Total Protein 6.1 (*)    Albumin 2.9 (*)    All other components within normal limits  URINALYSIS, ROUTINE W REFLEX MICROSCOPIC - Abnormal; Notable for the following components:   Color, Urine STRAW (*)    Glucose, UA 50 (*)    All other components within normal limits  I-STAT CHEM 8, ED - Abnormal; Notable for the following components:   Sodium 134 (*)    BUN 31 (*)    Glucose, Bld 103 (*)    Calcium, Ion 1.07 (*)    Hemoglobin 10.2 (*)    HCT 30.0 (*)    All other components within normal limits  URINE CULTURE  RESP PANEL BY RT-PCR (FLU A&B, COVID) ARPGX2  PROTIME-INR  APTT  DIFFERENTIAL  LIPID PANEL  HEMOGLOBIN A1C  CBG  MONITORING, ED  CBG MONITORING, ED    EKG EKG Interpretation  Date/Time:  Wednesday July 27 2021 18:14:41 EDT Ventricular Rate:  78 PR Interval:  173 QRS Duration: 106 QT Interval:  409 QTC Calculation: 466 R Axis:   24 Text Interpretation: Sinus rhythm Artifact in lead(s) I III aVL aVF V1 V2 No significant change since last tracing Confirmed by Jacalyn LefevreHaviland, Retaj Hilbun [phone]) on 07/27/2021 6:38:02 PM  Radiology MR BRAIN WO CONTRAST  Result Date: 07/27/2021 CLINICAL DATA:  Neuro deficit, acute, stroke suspected EXAM: MRI HEAD WITHOUT CONTRAST TECHNIQUE: Multiplanar, multiecho pulse sequences of the brain and surrounding structures were obtained without intravenous contrast. COMPARISON:  Same-day CT head.  MRI February 27, 2016. FINDINGS: Motion limited study.  Within this limitation: Brain: Multiple small areas mild DWI hyperintensity in the high right frontal lobe (series 2, images 34-37). At least one of these lesions appears to have correlate ADC hypointensity (series 250, image 36) and therefore likely represents a recent infarct. The other areas are either hyperintense or isointense on ADC and may represent subacute infarcts or artifactual T2 shine through. Mild T2 hyperintensity in these areas without mass effect. Additional mild for age scattered T2 hyperintensities in the supratentorial and pontine white matter, nonspecific but compatible with chronic microvascular ischemic disease. Small remote right cerebellar lacunar infarct. Mild for age atrophy. Vascular: Major arterial flow voids are maintained skull base. Skull and upper cervical spine: Normal marrow signal. Partially imaged upper cervical degenerative change with at least mild-to-moderate canal stenosis. Sinuses/Orbits: Mild paranasal sinus mucosal thickening. Left maxillary sinus retention cyst. No acute orbital findings. Other: No sizable mastoid effusions. IMPRESSION: Motion limited study. 1. Multiple small areas of mild DWI hyperintensity  in the high right frontal lobe. At least one of these lesions appears to have correlate ADC hypointensity, suggestive of a recent infarct. The other areas are either hyperintense or isointense on ADC and may represent subacute infarcts and/or artifactual T2 shine through. 2. Mild for age chronic microvascular ischemic disease and atrophy. 3. Upper cervical spine degenerative change with at least mild-to-moderate canal stenosis. An MRI of the cervical spine could further evaluate if clinically indicated. Electronically Signed   By: Feliberto HartsFrederick S Jones M.D.   On: 07/27/2021 18:00   CT HEAD CODE STROKE WO CONTRAST  Result Date: 07/27/2021 CLINICAL DATA:  Code stroke. Acute neuro deficit. Rule out stroke. Left-sided weakness EXAM: CT HEAD WITHOUT CONTRAST TECHNIQUE: Contiguous axial images were obtained from the base of the skull through the vertex without intravenous contrast. COMPARISON:  CT head 07/23/2021 FINDINGS: Brain: No evidence of acute infarction, hemorrhage, hydrocephalus, extra-axial collection or mass lesion/mass effect. Mild white matter changes most likely due to chronic microvascular ischemic change. Vascular: Negative for hyperdense vessel Skull: Negative Sinuses/Orbits: Extensive mucosal edema right sphenoid sinus unchanged. Mild mucosal edema maxillary sinus bilaterally. Bilateral cataract extraction. No orbital mass. Other: None ASPECTS (Alberta Stroke Program Early CT Score) - Ganglionic level infarction (caudate, lentiform nuclei, internal capsule, insula, M1-M3 cortex): 7 - Supraganglionic infarction (M4-M6 cortex): 3 Total score (0-10 with 10 being normal): 10 IMPRESSION: 1. No acute intracranial abnormality 2. ASPECTS is 10 3. Code stroke imaging results were communicated on 07/27/2021 at 3:45 pm to provider Pearlean BrownieSethi via text page Electronically Signed   By: Marlan Palauharles  Clark M.D.   On: 07/27/2021 15:46    Procedures Procedures   Medications Ordered in ED Medications  sodium chloride flush  (NS) 0.9 % injection 3 mL (has no administration in time range)  sodium chloride 0.9 % bolus 1,000 mL (has no administration in time range)    ED Course  I have reviewed the triage vital signs and the nursing notes.  Pertinent labs & imaging results that were available during my care of the patient were reviewed by me and considered in my medical decision making (see chart for details).    MDM Rules/Calculators/A&P                           Pt d/w Dr. Pearlean Brownie.  She is not a tpa candidate due to sx going on for 3 days and severity of sx are not high.  He recommends a MRI brain and admission to the hospitalist service.  Pt d/w Dr. Artis Flock (triad) for admission.  CRITICAL CARE Performed by: Jacalyn Lefevre   Total critical care time: 30 minutes  Critical care time was exclusive of separately billable procedures and treating other patients.  Critical care was necessary to treat or prevent imminent or life-threatening deterioration.  Critical care was time spent personally by me on the following activities: development of treatment plan with patient and/or surrogate as well as nursing, discussions with consultants, evaluation of patient's response to treatment, examination of patient, obtaining history from patient or surrogate, ordering and performing treatments and interventions, ordering and review of laboratory studies, ordering and review of radiographic studies, pulse oximetry and re-evaluation of patient's condition.   Final Clinical Impression(s) / ED Diagnoses Final diagnoses:  Cerebrovascular accident (CVA), unspecified mechanism (HCC)    Rx / DC Orders ED Discharge Orders     None        Jacalyn Lefevre, MD 07/27/21 [place]

## 2021-07-27 NOTE — ED Notes (Signed)
Dr. Wolfe at the bedside.  

## 2021-07-27 NOTE — ED Notes (Signed)
Pt back to [facility]

## 2021-07-27 NOTE — ED Notes (Signed)
The pts urine is almost white in color now  She is urinating almost every 5 minuts

## 2021-07-28 ENCOUNTER — Ambulatory Visit (HOSPITAL_COMMUNITY): Payer: Medicare Other

## 2021-07-28 ENCOUNTER — Observation Stay (HOSPITAL_COMMUNITY): Payer: Medicare Other

## 2021-07-28 ENCOUNTER — Inpatient Hospital Stay (HOSPITAL_BASED_OUTPATIENT_CLINIC_OR_DEPARTMENT_OTHER): Payer: Medicare Other

## 2021-07-28 DIAGNOSIS — D649 Anemia, unspecified: Secondary | ICD-10-CM

## 2021-07-28 DIAGNOSIS — M4802 Spinal stenosis, cervical region: Secondary | ICD-10-CM | POA: Diagnosis not present

## 2021-07-28 DIAGNOSIS — I10 Essential (primary) hypertension: Secondary | ICD-10-CM | POA: Diagnosis not present

## 2021-07-28 DIAGNOSIS — M5013 Cervical disc disorder with radiculopathy, cervicothoracic region: Secondary | ICD-10-CM | POA: Diagnosis not present

## 2021-07-28 DIAGNOSIS — E785 Hyperlipidemia, unspecified: Secondary | ICD-10-CM | POA: Diagnosis not present

## 2021-07-28 DIAGNOSIS — I6389 Other cerebral infarction: Secondary | ICD-10-CM | POA: Diagnosis not present

## 2021-07-28 DIAGNOSIS — I639 Cerebral infarction, unspecified: Secondary | ICD-10-CM | POA: Diagnosis not present

## 2021-07-28 DIAGNOSIS — M4312 Spondylolisthesis, cervical region: Secondary | ICD-10-CM | POA: Diagnosis not present

## 2021-07-28 DIAGNOSIS — M5114 Intervertebral disc disorders with radiculopathy, thoracic region: Secondary | ICD-10-CM | POA: Diagnosis not present

## 2021-07-28 LAB — IRON AND TIBC
Iron: 40 ug/dL (ref 28–170)
Saturation Ratios: 12 % (ref 10.4–31.8)
TIBC: 340 ug/dL (ref 250–450)
UIBC: 300 ug/dL

## 2021-07-28 LAB — ECHOCARDIOGRAM COMPLETE
Area-P 1/2: 3.39 cm2
Height: 62 in
S' Lateral: 3.3 cm
Weight: 2666.68 oz

## 2021-07-28 LAB — FERRITIN: Ferritin: 24 ng/mL (ref 11–307)

## 2021-07-28 MED ORDER — CLOPIDOGREL BISULFATE 75 MG PO TABS
75.0000 mg | ORAL_TABLET | Freq: Every day | ORAL | Status: DC
  Administered 2021-07-28 – 2021-07-29 (×2): 75 mg via ORAL
  Filled 2021-07-28 (×2): qty 1

## 2021-07-28 NOTE — TOC Initial Note (Signed)
Transition of Care [facility]) - Initial/Assessment Note    Patient Details  Name: Jill Hernandez MRN: [id number] Date of Birth: [birth year]  [facility] CM/SW Contact:    Pollie Friar, RN Phone Number: 07/28/2021, 3:54 PM  Clinical Narrative:                 Patient lives at the [facility]. She does her own medications at home and denies any issues. Her daughter does the needed transportation for the patient.  Recommendations are for SNF rehab. CM met with with the  patient and her daughter and they are in agreement to being faxed out in the [facility] area. The daughters first choice is [place] and CM will updated [place] at [place].  TOC following.  Expected Discharge Plan: Skilled Nursing Facility Barriers to Discharge: Continued Medical Work up   Patient Goals and CMS Choice   CMS Medicare.gov Compare Post Acute Care list provided to:: Patient Choice offered to / list presented to : Patient, Adult Children  Expected Discharge Plan and Services Expected Discharge Plan: [place] In-house Referral: Clinical Social Work Discharge Planning Services: CM Consult Post Acute Care Choice: [place] Living arrangements for the past 2 months: Apartment                                      Prior Living Arrangements/Services Living arrangements for the past 2 months: Apartment Lives with:: Self Patient language and need for interpreter reviewed:: Yes Do you feel safe going back to the place where you live?: Yes      Need for Family Participation in Patient Care: Yes (Comment) Care giver support system in place?: No (comment) Current home services: DME (rollator and shower seat) Criminal Activity/Legal Involvement Pertinent to Current Situation/Hospitalization: No - Comment as needed  Activities of Daily Living Home Assistive Devices/Equipment: Walker (specify type) ADL Screening (condition at time of admission) Patient's  cognitive ability adequate to safely complete daily activities?: No Is the patient deaf or have difficulty hearing?: No (pt has hearing aids) Does the patient have difficulty seeing, even when wearing glasses/contacts?: Yes Does the patient have difficulty concentrating, remembering, or making decisions?: Yes Patient able to express need for assistance with ADLs?: No Does the patient have difficulty dressing or bathing?: Yes Independently performs ADLs?: No Does the patient have difficulty walking or climbing stairs?: No Weakness of Legs: Both Weakness of Arms/Hands: Both  Permission Sought/Granted                  Emotional Assessment Appearance:: Appears stated age Attitude/Demeanor/Rapport: Engaged Affect (typically observed): Accepting Orientation: : Oriented to Self, Oriented to Place, Oriented to  Time, Oriented to Situation   Psych Involvement: No (comment)  Admission diagnosis:  Left sided numbness [R20.0] Cerebrovascular accident (CVA), unspecified mechanism ([place]) [I63.9] Patient Active Problem List   Diagnosis Date Noted   Left sided numbness 07/27/2021   Knee osteoarthritis 07/27/2021   GERD (gastroesophageal reflux disease) 07/27/2021   Anemia 07/27/2021   Acute CVA (cerebrovascular accident) ([place]) 07/27/2021   CAP (community acquired pneumonia) 04/09/2019   Small vessel disease, cerebrovascular 02/28/2016   Cerebral atrophy ([facility]) 02/28/2016   Hyponatremia 02/27/2016   Syncope 04/29/2014   TIA (transient ischemic attack) 04/29/2014   HTN (hypertension) 04/29/2014   HLD (hyperlipidemia) 04/29/2014   Left knee pain 04/29/2014   PCP:  Marda Stalker, PA-C Pharmacy:  [place], [place] - [address] [place] [place] [address] Phone: [phone] Fax: [phone]     Social Determinants of Health (SDOH) Interventions    Readmission Risk Interventions No flowsheet data found.

## 2021-07-28 NOTE — Progress Notes (Addendum)
STROKE TEAM PROGRESS NOTE   INTERVAL HISTORY Her daughter  is at the bedside.  She is hard of hearing but is pleasant and cooperative. She is more focused at this point on her generalized aches and pains than neurologic issues. Daughter reports she is at baseline.  She continues to complain of left upper extremity numbness.  Neurological exam is otherwise unchanged.  Vital signs are stable.  MRI scan shows few tiny subacute right frontal subcortical infarcts which cannot explain her presentation.  MRI scan cervical spine shows multifocal spinal stenosis with severe left-sided foraminal narrowing at C4-5 with likely root impingement.  Vitals:   07/28/21 0500 07/28/21 0700 07/28/21 1043 07/28/21 1158  BP: (!) 150/74 (!) 146/67 (!) 153/70 (!) 147/59  Pulse:  69 69 78  Resp: 14 16 17 20   Temp:  98.3 F (36.8 C) 99.1 F (37.3 C) 98.1 F (36.7 C)  TempSrc:  Oral Oral Oral  SpO2:  97% 97% 100%  Weight:      Height:       CBC:  Recent Labs  Lab 07/23/21 0241 07/27/21 1530 07/27/21 1535  WBC 8.6 7.6  --   NEUTROABS 5.5 5.2  --   HGB 11.5* 10.0* 10.2*  HCT 36.6 32.9* 30.0*  MCV 96.8 100.6*  --   PLT 270 264  --    Basic Metabolic Panel:  Recent Labs  Lab 07/23/21 0241 07/27/21 1530 07/27/21 1535  NA 129* 133* 134*  K 4.1 3.9 3.8  CL 91* 103 102  CO2 27 23  --   GLUCOSE 111* 108* 103*  BUN 25* 33* 31*  CREATININE 0.82 0.82 0.90  CALCIUM 10.3 8.2*  --     Lipid Panel:  Recent Labs  Lab 07/27/21 1859  CHOL 148  TRIG 205*  HDL 45  CHOLHDL 3.3  VLDL 41*  LDLCALC 62    HgbA1c:  Recent Labs  Lab 07/27/21 1715  HGBA1C 6.5*   Urine Drug Screen:  Recent Labs  Lab 07/23/21 0454  LABOPIA NONE DETECTED  COCAINSCRNUR NONE DETECTED  LABBENZ NONE DETECTED  AMPHETMU NONE DETECTED  THCU NONE DETECTED  LABBARB NONE DETECTED    Alcohol Level  Recent Labs  Lab 07/23/21 0241  ETH <10    IMAGING past 24 hours MR ANGIO HEAD WO CONTRAST  Result Date:  07/27/2021 CLINICAL DATA:  Neuro deficit, acute, stroke suspected. EXAM: MRA HEAD WITHOUT CONTRAST TECHNIQUE: Angiographic images of the Circle of Willis were acquired using MRA technique without intravenous contrast. COMPARISON:  No pertinent prior exam. FINDINGS: POSTERIOR CIRCULATION: --Vertebral arteries: Normal --Inferior cerebellar arteries: Normal. --Basilar artery: Normal. --Superior cerebellar arteries: Normal. --Posterior cerebral arteries: Normal. ANTERIOR CIRCULATION: --Intracranial internal carotid arteries: Normal. --Anterior cerebral arteries (ACA): Normal. --Middle cerebral arteries (MCA): Normal. ANATOMIC VARIANTS: None IMPRESSION: Normal intracranial MRA. Electronically Signed   By: Deatra RobinsonKevin  Herman M.D.   On: 07/27/2021 21:55   MR BRAIN WO CONTRAST  Result Date: 07/27/2021 CLINICAL DATA:  Neuro deficit, acute, stroke suspected EXAM: MRI HEAD WITHOUT CONTRAST TECHNIQUE: Multiplanar, multiecho pulse sequences of the brain and surrounding structures were obtained without intravenous contrast. COMPARISON:  Same-day CT head.  MRI February 27, 2016. FINDINGS: Motion limited study.  Within this limitation: Brain: Multiple small areas mild DWI hyperintensity in the high right frontal lobe (series 2, images 34-37). At least one of these lesions appears to have correlate ADC hypointensity (series 250, image 36) and therefore likely represents a recent infarct. The other areas are either hyperintense or  isointense on ADC and may represent subacute infarcts or artifactual T2 shine through. Mild T2 hyperintensity in these areas without mass effect. Additional mild for age scattered T2 hyperintensities in the supratentorial and pontine white matter, nonspecific but compatible with chronic microvascular ischemic disease. Small remote right cerebellar lacunar infarct. Mild for age atrophy. Vascular: Major arterial flow voids are maintained skull base. Skull and upper cervical spine: Normal marrow signal. Partially  imaged upper cervical degenerative change with at least mild-to-moderate canal stenosis. Sinuses/Orbits: Mild paranasal sinus mucosal thickening. Left maxillary sinus retention cyst. No acute orbital findings. Other: No sizable mastoid effusions. IMPRESSION: Motion limited study. 1. Multiple small areas of mild DWI hyperintensity in the high right frontal lobe. At least one of these lesions appears to have correlate ADC hypointensity, suggestive of a recent infarct. The other areas are either hyperintense or isointense on ADC and may represent subacute infarcts and/or artifactual T2 shine through. 2. Mild for age chronic microvascular ischemic disease and atrophy. 3. Upper cervical spine degenerative change with at least mild-to-moderate canal stenosis. An MRI of the cervical spine could further evaluate if clinically indicated. Electronically Signed   By: Feliberto Harts M.D.   On: 07/27/2021 18:00   MR CERVICAL SPINE WO CONTRAST  Result Date: 07/28/2021 CLINICAL DATA:  Cervical radiculopathy EXAM: MRI CERVICAL SPINE WITHOUT CONTRAST TECHNIQUE: Multiplanar, multisequence MR imaging of the cervical spine was performed. No intravenous contrast was administered. COMPARISON:  None. FINDINGS: Alignment: There is 3 mm anterolisthesis of C2 on C3 and C3 on C4, likely degenerative in nature. There is reversal of the normal cervical spine lordosis with focal kyphosis centered at C4-C5. Vertebrae: Vertebral body heights are preserved, without evidence of acute fracture. There is mild reactive marrow signal abnormality in the left posterior elements at C2 and C3. Marrow signal is otherwise normal. There is a prominent Schmorl's node indenting the superior C5 endplate. Cord: Normal signal and morphology. Posterior Fossa, vertebral arteries, paraspinal tissues: The paraspinal soft tissues are unremarkable. The vertebral artery flow voids are present. Disc levels: There is marked disc space narrowing at C4-C5 and C6-C7.  There is mild-to-moderate disc desiccation and narrowing at the remaining levels. Craniocervical junction: There is prominent degenerative pannus about the dens resulting in mild narrowing of the craniocervical junction without mass effect on the underlying cord/cervicomedullary junction. C2-C3: There is a broad-based posterior disc osteophyte complex, ligamentum flavum thickening, and uncovertebral and facet arthropathy resulting in moderate to severe spinal canal stenosis with effacement of the thecal sac and mild mass effect on the cord, and moderate bilateral neural foraminal stenosis. There are small bilateral facet joint effusions. C3-C4: There is a mild posterior disc osteophyte complex, ligamentum flavum thickening, and uncovertebral and facet arthropathy resulting in mild spinal canal stenosis with effacement of the ventral thecal sac and severe bilateral neural foraminal stenosis. C4-C5: There is a posterior disc osteophyte complex with prominent uncovertebral spurring, ligamentum flavum thickening, and bilateral facet arthropathy resulting in mild-to-moderate spinal canal stenosis and severe bilateral neural foraminal stenosis. C5-C6: There is a posterior disc osteophyte complex with a broad-based left paracentral protrusion, ligamentum flavum thickening, and uncovertebral and facet arthropathy resulting in moderate spinal canal stenosis with effacement of the thecal sac and severe bilateral neural foraminal stenosis C6-C7: There is a posterior disc osteophyte complex with a broad-based left paracentral protrusion, ligamentum flavum thickening, and uncovertebral and facet arthropathy resulting in moderate spinal canal stenosis with effacement of the thecal sac and severe left worse than right neural foraminal stenosis  C7-T1: There is degenerative endplate change and facet arthropathy resulting in moderate left and mild right neural foraminal stenosis without significant spinal canal stenosis. T1-T2: There  is a mild disc bulge, degenerative endplate change, and bilateral facet arthropathy resulting in moderate left and mild right neural foraminal stenosis without significant spinal canal stenosis. IMPRESSION: 1. Grade 1 anterolisthesis of C2 on C3 and C3 on C4, likely degenerative in nature. 2. Moderate to severe spinal canal stenosis C2-C3 with mild mass effect on the cord but no evidence of cord signal abnormality. There is also moderate spinal canal stenosis at C5-C6 and C6-C7 without cord compression. 3. Additional advanced multilevel degenerative changes with severe neural foraminal stenosis at numerous levels described in detail above. 4. Mild reactive marrow signal abnormality in the posterior elements on the left C2-C3. Electronically Signed   By: Lesia Hausen M.D.   On: 07/28/2021 10:28   CT HEAD CODE STROKE WO CONTRAST  Result Date: 07/27/2021 CLINICAL DATA:  Code stroke. Acute neuro deficit. Rule out stroke. Left-sided weakness EXAM: CT HEAD WITHOUT CONTRAST TECHNIQUE: Contiguous axial images were obtained from the base of the skull through the vertex without intravenous contrast. COMPARISON:  CT head 07/23/2021 FINDINGS: Brain: No evidence of acute infarction, hemorrhage, hydrocephalus, extra-axial collection or mass lesion/mass effect. Mild white matter changes most likely due to chronic microvascular ischemic change. Vascular: Negative for hyperdense vessel Skull: Negative Sinuses/Orbits: Extensive mucosal edema right sphenoid sinus unchanged. Mild mucosal edema maxillary sinus bilaterally. Bilateral cataract extraction. No orbital mass. Other: None ASPECTS ([place] Stroke Program Early CT Score) - Ganglionic level infarction (caudate, lentiform nuclei, internal capsule, insula, M1-M3 cortex): 7 - Supraganglionic infarction (M4-M6 cortex): 3 Total score (0-10 with 10 being normal): 10 IMPRESSION: 1. No acute intracranial abnormality 2. ASPECTS is 10 3. Code stroke imaging results were communicated on  07/27/2021 at 3:45 pm to provider Pearlean Brownie via text page Electronically Signed   By: Marlan Palau M.D.   On: 07/27/2021 15:46    PHYSICAL EXAM Elderly Caucasian lady not in distress. . Afebrile. Head is nontraumatic. Neck is supple without bruit.    Cardiac exam no murmur or gallop. Lungs are clear to auscultation. Distal pulses are well felt.  Neurological Exam ;  Awake  Alert oriented x 3. Normal speech and language.eye movements full without nystagmus.fundi were not visualized. Vision acuity and fields appear normal. Hearing is normal. Palatal movements are normal. Face symmetric. Tongue midline. Normal strength, tone, reflexes and coordination.  Except lower extremity movements limited due to arthritic pain.  Normal sensation.  Except left upper extremity subjective numbness.  Gait deferred. ASSESSMENT/PLAN Jill Hernandez is a 87 y.o. female with history of hypertension, hyperlipidemia and multiple TIAs and gastroesophageal flux disease.  She lives in independent living facility and states that she had sudden onset of left upper extremity numbness and EMS was called and called a code stroke.  The patient in fact states that she been having symptoms off and on for the last 2 to 4 days.  Her daughter who arrived later corroborated her story that she has been complaining of dizziness, bilateral blurred vision as well as some numbness in the left side off and on.  She was in fact evaluated in the ER for the symptoms on 07/23/2021 and had noncontrast CT scan of the head which showed age-related atrophy and no acute abnormalities.  CT angiogram was also obtained which showed no large vessel occlusion and showed moderate stenosis of the right ICA siphon  due to calcified plaque and mild right P2 atherosclerosis.  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  The patient was seen by me upon arrival and complained of left upper extremity numbness and heaviness but her NIH stroke scale  was positive only for this and she had no other deficits.  Emergent CT scan of the head was obtained which showed aspect score of 10 with no acute abnormality.   IV thrombolysis considered no deficits to mild and symptoms ongoing for several days. MRI brain shows multiple small areas of mild DWI hyperintensity in right frontal lobe, acute to subacute appearing. MRI cervical spine showed posterior disc osteophyte complex with mild spinal canal stenosis and severe bilateral neural foraminal stenosis seen at C4-C5, as well as additional advanced multilevel degenerative changes at numerous levels, explaining the left arm numbness.   Plan to continue with aspirin 81mg  and add plavix 75mg  x 3 weeks then continue with aspirin alone.  Follow up with Dr. as outpatient in 2 months.   Stroke :  Right frontal lobe subacute infarcts likely secondary to  small vessel disease .  Subjective left upper extremity numbness likely from compressive cervical myelopathy from degenerative cervical spine disease. Code Stroke  CT head No acute abnormality.  Small vessel disease. Atrophy.  ASPECTS 10.     MRI  BRAIN: Multiple small areas of mild DWI hyperintensity in the high right frontal lobe. At least one of these lesions appears to have correlate ADC hypointensity, suggestive of a recent infarct. The other areas are either hyperintense or isointense on ADC and may represent subacute infarcts and/or artifactual T2 shine through. 2. Mild for age chronic microvascular ischemic disease and atrophy. 3. Upper cervical spine degenerative change with at least mild-to-moderate canal stenosis  MRA  normal intracranial MrRA Carotid Doppler  pending 2D Echo ejection fraction 60 to 65%.  No cardiac source of embolism. LDL 62 HgbA1c 6.5 VTE prophylaxis - scd    Diet   DIET DYS 3 Room service appropriate? Yes; Fluid consistency: Thin   aspirin 81 mg daily prior to admission, now on aspirin 81 mg daily and clopidogrel 75 mg  daily.   Therapy recommendations:  none yet Disposition:  snf-vs-home  Hypertension Home meds:  amlodipine Stable Permissive hypertension (OK if < 220/120) but gradually normalize in 5-7 days Long-term BP goal normotensive  Hyperlipidemia Home meds:  lovastatin 20mg , pravastatin started in hospital LDL 62, goal <   Continue statin at discharge  HgbA1c 6.5, goal < 7.0 CBGs Recent Labs    07/27/21 1530  GLUCAP 98    SSI  Other Stroke Risk Factors  Advanced Age >/= 17  Obesity, Body mass index is 30.48 kg/m., BMI >/= 30 associated with increased stroke risk, recommend weight loss, diet and exercise as appropriate    Other Active Problems  Anemia Around her baseline hemoglobin 9.7-11.5 -no recent  iron studies so these are pending     Knee osteoarthritis -Uses Tylenol as needed and I have ordered this -Uses Biofreeze -Do Voltaren gel as needed while here     GERD (gastroesophageal reflux disease) -Continue Prilosec daily    Hospital day # 0 I have personally obtained history,examined this patient, reviewed notes, independently viewed imaging studies, participated in medical decision making and plan of care.ROS completed by me personally and pertinent positives fully documented  I have made any additions or clarifications directly to the above note. Agree with note above.  Patient presented with multifocal  symptoms of dizziness, blurred vision and left upper extremity numbness off and on for the last several days and MRI shows tiny subacute right frontal subcortical infarcts which are likely from small vessel disease.  Left upper extremity numbness may be related to degenerative cervical spine disease and compressive radiculopathy.  Check carotid ultrasound and mobilize out of bed and therapy consults.  Patient dizziness and gait difficulties are likely multifactorial due to combination of arthritis, cervical spine stenosis cerebrovascular disease.  Aspirin Plavix for 3 weeks  followed by aspirin.  Risk factor modification.  Patient and daughter would do not want to consider surgery for her spinal stenosis and compressive radiculopathy given her advanced age.  Discussed with Dr. Jerral Ralph.  Greater than 50% time during this 35-minute visit was spent in counseling and coordination of care about her numbness and stroke and answering questions Delia Heady, MD Medical Director [facility] Pager: [phone] 07/28/2021 2:20 PM     To contact Stroke Continuity provider, please refer to [email or web address]. After hours, contact General Neurology

## 2021-07-28 NOTE — Care Management Obs Status (Signed)
MEDICARE OBSERVATION STATUS NOTIFICATION   Patient Details  Name: Jill Hernandez MRN: [id number] Date of Birth: 1930/02/18   Medicare Observation Status Notification Given:  Yes    Kermit Balo, RN 07/28/2021, 3:41 PM

## 2021-07-28 NOTE — Evaluation (Signed)
Physical Therapy Evaluation Patient Details Name: Jill Hernandez MRN: [id number] DOB: 1930-09-11 Today's Date: 07/28/2021   History of Present Illness  Pt is 87 yo female who presented with L upper extremity weakness and numbess on 07/27/21 and found to have subacute infarcts in R frontal lobe. Pt with hx of HTN, HLD, GERD,OA,  multiple TIA.  Clinical Impression  Pt admitted with above diagnosis. At baseline, pt lives at ILF and is ambulatory with rollator and ADLs.  Daughter assist with IADLs and pt has had recent falls.  She has some limitations at baseline due to severe OA in knees.  At evaluation, pt presenting with generalized weakness and pain that limited mobility.  Additionally, pt reporting "wooziness and weakness" and was found to have orthostatic hypotension (facial numbness increased when OOB), symptoms did ease after 3 mins but BP still low.  She required min-mod A and was only able to transfer to [facility] due to this weakness and orthostatic symptoms. Pt well below her baseline. Recommend SNF at d/c  Pt currently with functional limitations due to the deficits listed below (see PT Problem List). Pt will benefit from skilled PT to increase their independence and safety with mobility to allow discharge to the venue listed below.       Follow Up Recommendations SNF    Equipment Recommendations  None recommended by PT    Recommendations for Other Services       Precautions / Restrictions Precautions Precautions: Fall Precaution Comments: watch bp (orthostatic)      Mobility  Bed Mobility Overal bed mobility: Needs Assistance Bed Mobility: Supine to Sit;Sit to Supine     Supine to sit: Min assist Sit to supine: Min assist   General bed mobility comments: increased time    Transfers Overall transfer level: Needs assistance Equipment used: Rolling walker (2 wheeled) Transfers: Sit to/from [employer] Sit to Stand: Mod assist;Min assist Stand pivot  transfers: Min assist       General transfer comment: Mod A to rise from bed with use of momentum (performed x 2); Min A from bsc with use of armrest; Min A to pivot to and from bsc but with increased time.  Ambulation/Gait Ambulation/Gait assistance: Mod assist Gait Distance (Feet): 2 Feet (2'x3) Assistive device: Rolling walker (2 wheeled) Gait Pattern/deviations: Step-to pattern;Decreased stride length;Shuffle Gait velocity: decreased   General Gait Details: Ambulated 2' at EOB and returned to sitting due to dizziness.  Dizziness improving and pt needing to use bsc before returning to bed so then did 2'x2 from bed to bsc and back.  Pt requiring mod A for balance and weight shifting, difficulty advancing feet. See general comments for orthostatic hypotension  Stairs            Wheelchair Mobility    Modified Rankin (Stroke Patients Only) Modified Rankin (Stroke Patients Only) Pre-Morbid Rankin Score: Moderate disability Modified Rankin: Moderately severe disability     Balance Overall balance assessment: Needs assistance Sitting-balance support: No upper extremity supported Sitting balance-Leahy Scale: Fair     Standing balance support: Bilateral upper extremity supported Standing balance-Leahy Scale: Poor Standing balance comment: requiring RW                             Pertinent Vitals/Pain Pain Assessment: 0-10 Pain Score: 8  Pain Location: bil knees, legs, feet (from RA) Pain Descriptors / Indicators: Aching;Discomfort;Sore Pain Intervention(s): Limited activity within patient's tolerance;Monitored during session  Home Living Family/patient expects to be discharged to:: Private residence Living Arrangements: Alone Available Help at Discharge: Family;Available PRN/intermittently Type of Home: Independent living facility Home Access: Elevator     Home Layout: One level Home Equipment: Shower seat;Grab bars - tub/shower;Grab bars -  toilet;Bedside commode;Walker - 4 wheels      Prior Function Level of Independence: Needs assistance   Gait / Transfers Assistance Needed: Walks with rollator; can ambulate short community distances  ADL's / Homemaking Assistance Needed: Dresses and toilets independently; assist with bathing; daughter assistwith IADLs (fixes food and pt heats)  Comments: Recently has had falls about every other motnh     Hand Dominance        Extremity/Trunk Assessment   Upper Extremity Assessment Upper Extremity Assessment: Defer to OT evaluation    Lower Extremity Assessment Lower Extremity Assessment: LLE deficits/detail;RLE deficits/detail RLE Deficits / Details: ROM WFL; MMT: 4-/5 throughout (limitations due to pain) RLE Coordination: WNL LLE Deficits / Details: ROM WFL; MMT: 4-/5 throughout (limitations due to pain) LLE Coordination: WNL    Cervical / Trunk Assessment Cervical / Trunk Assessment: Kyphotic  Communication   Communication: HOH  Cognition Arousal/Alertness: Awake/alert Behavior During Therapy: WFL for tasks assessed/performed Overall Cognitive Status: Within Functional Limits for tasks assessed                                 General Comments: Very sharp, some limitation from [facility]      General Comments  BP supine 143/75 Pt reports some wooziness in sitting and BP 118/71 report symptoms improving after 3 mins and was able to stand BP in standing 98/57.  Returned to sitting and was going to lay down but needing to use BSC BP 111/76 , used BSC then return to EOB and pt reports facial numbness increased.  Returned to bed and BP 110/78 but symptoms including facial numbness resolving. Notified RN    Exercises     Assessment/Plan    PT Assessment Patient needs continued PT services  PT Problem List Decreased strength;Decreased mobility;Decreased range of motion;Decreased activity tolerance;Cardiopulmonary status limiting activity;Decreased  balance;Decreased knowledge of use of DME;Pain       PT Treatment Interventions DME instruction;Therapeutic activities;Modalities;Gait training;Therapeutic exercise;Patient/family education;Balance training;Functional mobility training    PT Goals (Current goals can be found in the Care Plan section)  Acute Rehab PT Goals Patient Stated Goal: return to walking; agreeable to SNF PT Goal Formulation: With patient/family Time For Goal Achievement: 08/11/21 Potential to Achieve Goals: Good    Frequency Min 3X/week   Barriers to discharge Decreased caregiver support      Co-evaluation               AM-PAC PT "6 Clicks" Mobility  Outcome Measure Help needed turning from your back to your side while in a flat bed without using bedrails?: A Little Help needed moving from lying on your back to sitting on the side of a flat bed without using bedrails?: A Little Help needed moving to and from a bed to a chair (including a wheelchair)?: A Lot Help needed standing up from a chair using your arms (e.g., wheelchair or bedside chair)?: A Lot Help needed to walk in hospital room?: A Lot Help needed climbing 3-5 steps with a railing? : Total 6 Click Score: 13    End of Session Equipment Utilized During Treatment: Gait belt Activity Tolerance: Patient tolerated treatment well Patient left:  in bed;with call bell/phone within reach;with bed alarm set;with family/visitor present Nurse Communication: Mobility status PT Visit Diagnosis: Unsteadiness on feet (R26.81);Muscle weakness (generalized) (M62.81)    Time: 0786-7544 PT Time Calculation (min) (ACUTE ONLY): 54 min   Charges:   PT Evaluation $PT Eval Moderate Complexity: 1 Mod PT Treatments $Gait Training: 8-22 mins $Therapeutic Activity: 23-37 mins        Anise Salvo, PT Acute Rehab Services Pager [phone] [facility] [phone]   Rayetta Humphrey 07/28/2021, 2:49 PM

## 2021-07-28 NOTE — Progress Notes (Signed)
PROGRESS NOTE        PATIENT DETAILS Name: Jill Hernandez Age: 87 y.o. Sex: female Date of Birth: 24-Feb-1930 Admit Date: 07/27/2021 Admitting Physician Orland Mustard, MD YYQ:MGNOIBB, Toni Amend, PA-C  Brief Narrative: Patient is a 87 y.o. female with history of HTN, HLD, GERD, multiple TIAs-presented with left upper extremity numbness-upon further evaluation she was found to have acute CVA and admitted to the hospitalist service.  See below for further details.  Significant events: 8/31>> presented with left upper extremity numbness-further evaluation found to have acute CVA.  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 8/31>> MRI brain: Subacute infarcts in the right frontal lobe. 8/31>> MRA brain: No significant stenosis. 9/01>> MRI C-spine: Moderate to severe spinal canal stenosis C2-C3 with mild mass-effect on the cord-but no evidence of cord signal abnormality.  Moderate spinal canal stenosis at C5-C6 and C6-C7. 9/01>> Echo: Pending 9/01>> LDL: 62 9/01>> A1c: 6.5  Antimicrobial therapy: None  Microbiology data: 8/31>> urine culture: Pending 8/31>> COVID/influenza PCR: Negative  Procedures : None  Consults: Neurology  DVT Prophylaxis : enoxaparin (LOVENOX) injection 40 mg Start: 07/27/21 1900  Subjective: Left upper extremity numbness has resolved.  Assessment/Plan: Acute CVA: Left upper extremity numbness has resolved-work-up in progress-on aspirin/Plavix/statin.  Await further recommendations from neurology.  Await PT/OT eval.  Recent UTI: Denies any dysuria to me this morning-UA on 8/31 was completely clear.  Urine cultures pending.  No indication for antibiotics.  2 mm left ICA paraophthalmic aneurysm: Seen incidentally on CTA a few days back-probably stable for further follow-up/surveillance to be done in the outpatient setting.  HTN:  BP stable-allowing permissive hypertension in setting of CVA.  Normocytic anemia: At baseline-stable for further work-up in the outpatient setting.  GERD: Continue PPI  OA bilateral knees: Supportive care  Obesity: Estimated body mass index is 30.48 kg/m as calculated from the following:   Height as of this encounter: 5\' 2"  (1.575 m).   Weight as of this encounter: 75.6 kg.    Diet: Diet Order             DIET DYS 3 Room service appropriate? Yes; Fluid consistency: Thin  Diet effective ____                    Code Status: Full code   Family Communication: None at bedside   Disposition Plan: Status is: Observation  The patient remains OBS appropriate and will d/c before 2 midnights.  Dispo: The patient is from: Home              Anticipated d/c is to: Home              Patient currently is not medically stable to d/c.   Difficult to place patient No  Barriers to Discharge: Acute CVA-awaiting further work-up-rehab services evaluation.  Antimicrobial agents: Anti-infectives (From admission, onward)    None        Time spent: 25 minutes-Greater than 50% of this time was spent in counseling, explanation of diagnosis, planning of further management, and coordination of care.  MEDICATIONS: Scheduled Meds:  acetaminophen  650 mg Oral Q8H   aspirin  81 mg Oral Q1500   clopidogrel  75 mg Oral Daily   diclofenac Sodium  4 g Topical QID  enoxaparin (LOVENOX) injection  40 mg Subcutaneous Q24H   pantoprazole  80 mg Oral Daily   pravastatin  20 mg Oral q1800   sodium chloride flush  3 mL Intravenous Once   sodium chloride flush  3 mL Intravenous Q12H   Continuous Infusions:  sodium chloride     PRN Meds:.sodium chloride, metoprolol tartrate, senna-docusate, sodium chloride flush   PHYSICAL EXAM: Vital signs: Vitals:   07/28/21 0300 07/28/21 0500 07/28/21 0700 07/28/21 1043  BP: (!) 129/53 (!) 150/74 (!) 146/67 (!) 153/70  Pulse: 71  69 69  Resp:  15 14 16 17   Temp:   98.3 F (36.8 C) 99.1 F (37.3 C)  TempSrc:   Oral Oral  SpO2: 96%  97% 97%  Weight:      Height:       Filed Weights   07/27/21 1500 07/28/21 0044  Weight: 76.2 kg 75.6 kg   Body mass index is 30.48 kg/m.   Gen Exam:Alert awake-not in any distress HEENT:atraumatic, normocephalic Chest: B/L clear to auscultation anteriorly CVS:S1S2 regular Abdomen:soft non tender, non distended Extremities:no edema Neurology: Non focal Skin: no rash  I have personally reviewed following labs and imaging studies  LABORATORY DATA: CBC: Recent Labs  Lab 07/23/21 0241 07/27/21 1530 07/27/21 1535  WBC 8.6 7.6  --   NEUTROABS 5.5 5.2  --   HGB 11.5* 10.0* 10.2*  HCT 36.6 32.9* 30.0*  MCV 96.8 100.6*  --   PLT 270 264  --     Basic Metabolic Panel: Recent Labs  Lab 07/23/21 0241 07/27/21 1530 07/27/21 1535  NA 129* 133* 134*  K 4.1 3.9 3.8  CL 91* 103 102  CO2 27 23  --   GLUCOSE 111* 108* 103*  BUN 25* 33* 31*  CREATININE 0.82 0.82 0.90  CALCIUM 10.3 8.2*  --     GFR: Estimated Creatinine Clearance: 40.3 mL/min (by C-G formula based on SCr of 0.9 mg/dL).  Liver Function Tests: Recent Labs  Lab 07/23/21 0241 07/27/21 1530  AST 17 15  ALT 14 12  ALKPHOS 75 57  BILITOT 0.9 0.6  PROT 7.0 6.1*  ALBUMIN 3.3* 2.9*   No results for input(s): LIPASE, AMYLASE in the last 168 hours. No results for input(s): AMMONIA in the last 168 hours.  Coagulation Profile: Recent Labs  Lab 07/23/21 0241 07/27/21 1530  INR 1.0 1.0    Cardiac Enzymes: No results for input(s): CKTOTAL, CKMB, CKMBINDEX, TROPONINI in the last 168 hours.  BNP (last 3 results) No results for input(s): PROBNP in the last 8760 hours.  Lipid Profile: Recent Labs    07/27/21 1859  CHOL 148  HDL 45  LDLCALC 62  TRIG 205*  CHOLHDL 3.3    Thyroid Function Tests: No results for input(s): TSH, T4TOTAL, FREET4, T3FREE, THYROIDAB in the last 72 hours.  Anemia Panel: Recent  Labs    07/28/21 0110  FERRITIN 24  TIBC 340  IRON 40    Urine analysis:    Component Value Date/Time   COLORURINE STRAW (A) 07/27/2021 1603   APPEARANCEUR CLEAR 07/27/2021 1603   LABSPEC 1.005 07/27/2021 1603   PHURINE 7.0 07/27/2021 1603   GLUCOSEU 50 (A) 07/27/2021 1603   HGBUR NEGATIVE 07/27/2021 1603   BILIRUBINUR NEGATIVE 07/27/2021 1603   KETONESUR NEGATIVE 07/27/2021 1603   PROTEINUR NEGATIVE 07/27/2021 1603   UROBILINOGEN 0.2 04/29/2014 1514   NITRITE NEGATIVE 07/27/2021 1603   LEUKOCYTESUR NEGATIVE 07/27/2021 1603    Sepsis Labs: Lactic Acid, Venous  Component Value Date/Time   LATICACIDVEN 1.0 04/09/2019 1650    MICROBIOLOGY: Recent Results (from the past 240 hour(s))  Resp Panel by RT-PCR (Flu A&B, Covid) Nasopharyngeal Swab     Status: None   Collection Time: 07/23/21  2:41 AM   Specimen: Nasopharyngeal Swab; Nasopharyngeal(NP) swabs in vial transport medium  Result Value Ref Range Status   SARS Coronavirus 2 by RT PCR NEGATIVE NEGATIVE Final    Comment: (NOTE) SARS-CoV-2 target nucleic acids are NOT DETECTED.  The SARS-CoV-2 RNA is generally detectable in upper respiratory specimens during the acute phase of infection. The lowest concentration of SARS-CoV-2 viral copies this assay can detect is 138 copies/mL. A negative result does not preclude SARS-Cov-2 infection and should not be used as the sole basis for treatment or other patient management decisions. A negative result may occur with  improper specimen collection/handling, submission of specimen other than nasopharyngeal swab, presence of viral mutation(s) within the areas targeted by this assay, and inadequate number of viral copies(<138 copies/mL). A negative result must be combined with clinical observations, patient history, and epidemiological information. The expected result is Negative.  Fact Sheet for Patients:  BloggerCourse.com  Fact Sheet for Healthcare  Providers:  SeriousBroker.it  This test is no t yet approved or cleared by the [place] FDA and  has been authorized for detection and/or diagnosis of SARS-CoV-2 by FDA under an Emergency Use Authorization (EUA). This EUA will remain  in effect (meaning this test can be used) for the duration of the COVID-19 declaration under Section 564(b)(1) of the Act, 21 U.S.C.section 360bbb-3(b)(1), unless the authorization is terminated  or revoked sooner.       Influenza A by PCR NEGATIVE NEGATIVE Final   Influenza B by PCR NEGATIVE NEGATIVE Final    Comment: (NOTE) The Xpert Xpress SARS-CoV-2/FLU/RSV plus assay is intended as an aid in the diagnosis of influenza from Nasopharyngeal swab specimens and should not be used as a sole basis for treatment. Nasal washings and aspirates are unacceptable for Xpert Xpress SARS-CoV-2/FLU/RSV testing.  Fact Sheet for Patients: BloggerCourse.com  Fact Sheet for Healthcare Providers: SeriousBroker.it  This test is not yet approved or cleared by the [place] FDA and has been authorized for detection and/or diagnosis of SARS-CoV-2 by FDA under an Emergency Use Authorization (EUA). This EUA will remain in effect (meaning this test can be used) for the duration of the COVID-19 declaration under Section 564(b)(1) of the Act, 21 U.S.C. section 360bbb-3(b)(1), unless the authorization is terminated or revoked.  Performed at [facility], [address], [place], [place] [address]   Resp Panel by RT-PCR (Flu A&B, Covid) Nasopharyngeal Swab     Status: None   Collection Time: 07/27/21  6:59 PM   Specimen: Nasopharyngeal Swab; Nasopharyngeal(NP) swabs in vial transport medium  Result Value Ref Range Status   SARS Coronavirus 2 by RT PCR NEGATIVE NEGATIVE Final    Comment: (NOTE) SARS-CoV-2 target nucleic acids are NOT DETECTED.  The SARS-CoV-2 RNA is generally  detectable in upper respiratory specimens during the acute phase of infection. The lowest concentration of SARS-CoV-2 viral copies this assay can detect is 138 copies/mL. A negative result does not preclude SARS-Cov-2 infection and should not be used as the sole basis for treatment or other patient management decisions. A negative result may occur with  improper specimen collection/handling, submission of specimen other than nasopharyngeal swab, presence of viral mutation(s) within the areas targeted by this assay, and inadequate number of viral copies(<138  copies/mL). A negative result must be combined with clinical observations, patient history, and epidemiological information. The expected result is Negative.  Fact Sheet for Patients:  BloggerCourse.com  Fact Sheet for Healthcare Providers:  SeriousBroker.it  This test is no t yet approved or cleared by the [place] FDA and  has been authorized for detection and/or diagnosis of SARS-CoV-2 by FDA under an Emergency Use Authorization (EUA). This EUA will remain  in effect (meaning this test can be used) for the duration of the COVID-19 declaration under Section 564(b)(1) of the Act, 21 U.S.C.section 360bbb-3(b)(1), unless the authorization is terminated  or revoked sooner.       Influenza A by PCR NEGATIVE NEGATIVE Final   Influenza B by PCR NEGATIVE NEGATIVE Final    Comment: (NOTE) The Xpert Xpress SARS-CoV-2/FLU/RSV plus assay is intended as an aid in the diagnosis of influenza from Nasopharyngeal swab specimens and should not be used as a sole basis for treatment. Nasal washings and aspirates are unacceptable for Xpert Xpress SARS-CoV-2/FLU/RSV testing.  Fact Sheet for Patients: BloggerCourse.com  Fact Sheet for Healthcare Providers: SeriousBroker.it  This test is not yet approved or cleared by the [place] FDA  and has been authorized for detection and/or diagnosis of SARS-CoV-2 by FDA under an Emergency Use Authorization (EUA). This EUA will remain in effect (meaning this test can be used) for the duration of the COVID-19 declaration under Section 564(b)(1) of the Act, 21 U.S.C. section 360bbb-3(b)(1), unless the authorization is terminated or revoked.  Performed at [facility], [address], [place], [place] [address]     RADIOLOGY STUDIES/RESULTS: MR ANGIO HEAD WO CONTRAST  Result Date: 07/27/2021 CLINICAL DATA:  Neuro deficit, acute, stroke suspected. EXAM: MRA HEAD WITHOUT CONTRAST TECHNIQUE: Angiographic images of the Circle of Willis were acquired using MRA technique without intravenous contrast. COMPARISON:  No pertinent prior exam. FINDINGS: POSTERIOR CIRCULATION: --Vertebral arteries: Normal --Inferior cerebellar arteries: Normal. --Basilar artery: Normal. --Superior cerebellar arteries: Normal. --Posterior cerebral arteries: Normal. ANTERIOR CIRCULATION: --Intracranial internal carotid arteries: Normal. --Anterior cerebral arteries (ACA): Normal. --Middle cerebral arteries (MCA): Normal. ANATOMIC VARIANTS: None IMPRESSION: Normal intracranial MRA. Electronically Signed   By: Deatra Robinson M.D.   On: 07/27/2021 21:55   MR BRAIN WO CONTRAST  Result Date: 07/27/2021 CLINICAL DATA:  Neuro deficit, acute, stroke suspected EXAM: MRI HEAD WITHOUT CONTRAST TECHNIQUE: Multiplanar, multiecho pulse sequences of the brain and surrounding structures were obtained without intravenous contrast. COMPARISON:  Same-day CT head.  MRI February 27, 2016. FINDINGS: Motion limited study.  Within this limitation: Brain: Multiple small areas mild DWI hyperintensity in the high right frontal lobe (series 2, images 34-37). At least one of these lesions appears to have correlate ADC hypointensity (series 250, image 36) and therefore likely represents a recent infarct. The other areas are either hyperintense or  isointense on ADC and may represent subacute infarcts or artifactual T2 shine through. Mild T2 hyperintensity in these areas without mass effect. Additional mild for age scattered T2 hyperintensities in the supratentorial and pontine white matter, nonspecific but compatible with chronic microvascular ischemic disease. Small remote right cerebellar lacunar infarct. Mild for age atrophy. Vascular: Major arterial flow voids are maintained skull base. Skull and upper cervical spine: Normal marrow signal. Partially imaged upper cervical degenerative change with at least mild-to-moderate canal stenosis. Sinuses/Orbits: Mild paranasal sinus mucosal thickening. Left maxillary sinus retention cyst. No acute orbital findings. Other: No sizable mastoid effusions. IMPRESSION: Motion limited study. 1. Multiple small areas of mild DWI  hyperintensity in the high right frontal lobe. At least one of these lesions appears to have correlate ADC hypointensity, suggestive of a recent infarct. The other areas are either hyperintense or isointense on ADC and may represent subacute infarcts and/or artifactual T2 shine through. 2. Mild for age chronic microvascular ischemic disease and atrophy. 3. Upper cervical spine degenerative change with at least mild-to-moderate canal stenosis. An MRI of the cervical spine could further evaluate if clinically indicated. Electronically Signed   By: Feliberto Harts M.D.   On: 07/27/2021 18:00   MR CERVICAL SPINE WO CONTRAST  Result Date: 07/28/2021 CLINICAL DATA:  Cervical radiculopathy EXAM: MRI CERVICAL SPINE WITHOUT CONTRAST TECHNIQUE: Multiplanar, multisequence MR imaging of the cervical spine was performed. No intravenous contrast was administered. COMPARISON:  None. FINDINGS: Alignment: There is 3 mm anterolisthesis of C2 on C3 and C3 on C4, likely degenerative in nature. There is reversal of the normal cervical spine lordosis with focal kyphosis centered at C4-C5. Vertebrae: Vertebral body  heights are preserved, without evidence of acute fracture. There is mild reactive marrow signal abnormality in the left posterior elements at C2 and C3. Marrow signal is otherwise normal. There is a prominent Schmorl's node indenting the superior C5 endplate. Cord: Normal signal and morphology. Posterior Fossa, vertebral arteries, paraspinal tissues: The paraspinal soft tissues are unremarkable. The vertebral artery flow voids are present. Disc levels: There is marked disc space narrowing at C4-C5 and C6-C7. There is mild-to-moderate disc desiccation and narrowing at the remaining levels. Craniocervical junction: There is prominent degenerative pannus about the dens resulting in mild narrowing of the craniocervical junction without mass effect on the underlying cord/cervicomedullary junction. C2-C3: There is a broad-based posterior disc osteophyte complex, ligamentum flavum thickening, and uncovertebral and facet arthropathy resulting in moderate to severe spinal canal stenosis with effacement of the thecal sac and mild mass effect on the cord, and moderate bilateral neural foraminal stenosis. There are small bilateral facet joint effusions. C3-C4: There is a mild posterior disc osteophyte complex, ligamentum flavum thickening, and uncovertebral and facet arthropathy resulting in mild spinal canal stenosis with effacement of the ventral thecal sac and severe bilateral neural foraminal stenosis. C4-C5: There is a posterior disc osteophyte complex with prominent uncovertebral spurring, ligamentum flavum thickening, and bilateral facet arthropathy resulting in mild-to-moderate spinal canal stenosis and severe bilateral neural foraminal stenosis. C5-C6: There is a posterior disc osteophyte complex with a broad-based left paracentral protrusion, ligamentum flavum thickening, and uncovertebral and facet arthropathy resulting in moderate spinal canal stenosis with effacement of the thecal sac and severe bilateral neural  foraminal stenosis C6-C7: There is a posterior disc osteophyte complex with a broad-based left paracentral protrusion, ligamentum flavum thickening, and uncovertebral and facet arthropathy resulting in moderate spinal canal stenosis with effacement of the thecal sac and severe left worse than right neural foraminal stenosis C7-T1: There is degenerative endplate change and facet arthropathy resulting in moderate left and mild right neural foraminal stenosis without significant spinal canal stenosis. T1-T2: There is a mild disc bulge, degenerative endplate change, and bilateral facet arthropathy resulting in moderate left and mild right neural foraminal stenosis without significant spinal canal stenosis. IMPRESSION: 1. Grade 1 anterolisthesis of C2 on C3 and C3 on C4, likely degenerative in nature. 2. Moderate to severe spinal canal stenosis C2-C3 with mild mass effect on the cord but no evidence of cord signal abnormality. There is also moderate spinal canal stenosis at C5-C6 and C6-C7 without cord compression. 3. Additional advanced multilevel degenerative changes with severe  neural foraminal stenosis at numerous levels described in detail above. 4. Mild reactive marrow signal abnormality in the posterior elements on the left C2-C3. Electronically Signed   By: Lesia Hausen M.D.   On: 07/28/2021 10:28   CT HEAD CODE STROKE WO CONTRAST  Result Date: 07/27/2021 CLINICAL DATA:  Code stroke. Acute neuro deficit. Rule out stroke. Left-sided weakness EXAM: CT HEAD WITHOUT CONTRAST TECHNIQUE: Contiguous axial images were obtained from the base of the skull through the vertex without intravenous contrast. COMPARISON:  CT head 07/23/2021 FINDINGS: Brain: No evidence of acute infarction, hemorrhage, hydrocephalus, extra-axial collection or mass lesion/mass effect. Mild white matter changes most likely due to chronic microvascular ischemic change. Vascular: Negative for hyperdense vessel Skull: Negative Sinuses/Orbits:  Extensive mucosal edema right sphenoid sinus unchanged. Mild mucosal edema maxillary sinus bilaterally. Bilateral cataract extraction. No orbital mass. Other: None ASPECTS ([place] Stroke Program Early CT Score) - Ganglionic level infarction (caudate, lentiform nuclei, internal capsule, insula, M1-M3 cortex): 7 - Supraganglionic infarction (M4-M6 cortex): 3 Total score (0-10 with 10 being normal): 10 IMPRESSION: 1. No acute intracranial abnormality 2. ASPECTS is 10 3. Code stroke imaging results were communicated on 07/27/2021 at 3:45 pm to provider Pearlean Brownie via text page Electronically Signed   By: Marlan Palau M.D.   On: 07/27/2021 15:46     LOS: 0 days   Jeoffrey Massed, MD  [facility]    To contact the attending provider between 7A-7P or the covering provider during after hours 7P-7A, please log into the web site [email or web address] and access using universal B and E password for that web site. If you do not have the password, please call the hospital operator.  07/28/2021, 11:46 AM

## 2021-07-28 NOTE — Progress Notes (Addendum)
Occupational Therapy Evaluation  PTA pt lives at [facility] where she mobilizes using her rollator and is able to complete her self care tasks. Daughter helps her shower 1 x/wk. Pt states LB ADL tasks have become more difficult lately and that she "gets worn out" easily. Pt demonstrates generalized weakness, L weaker than right, complains of "blurry vision"and  altered sensation LUE, in addition to deficits listed below. Feel pt will greatly benefit from rehab at SNF to maximize functional level of independence and assess ability to return to ILF or need for ALF. Supportive daughter present during session and both are agreeable to rehab. Will follow acutely.  BP stable throughout session without orthostasis noted. BP supine 134/70; sitting 138/79; after transfer to chair 155/70; HR 71.  07/28/21 1448  OT Visit Information  Last OT Received On 07/28/21  Assistance Needed +1  History of Present Illness Pt is 87 yo female who presented with L upper extremity weakness and numbess on 07/27/21 and found to have subacute infarcts in R frontal lobe. Pt with hx of HTN, HLD, GERD,OA,  multiple TIA  Precautions  Precautions Fall  Home Living  Family/patient expects to be discharged to: Skilled nursing facility  Prior Function  Level of Independence Needs assistance  Gait / Transfers Assistance Needed Walks with rollator; can ambulate short community distances  ADL's / Homemaking Assistance Needed Dresses and toilets independently; assist with bathing; daughter assistwith IADLs (fixes food and pt heats); LB ADL have become more difficult  Comments Recently has had falls about every other month; Lives at [facility]; daughter assists wtih shower 1x/wk  Communication  Communication HOH  Pain Assessment  Pain Assessment Faces  Faces Pain Scale 4  Pain Location bil knees, legs, feet (from RA)  Pain Descriptors / Indicators Discomfort;Grimacing  Pain Intervention(s)  Limited activity within patient's tolerance;Repositioned  Cognition  Arousal/Alertness Awake/alert  Behavior During Therapy WFL for tasks assessed/performed  Overall Cognitive Status Within Functional Limits for tasks assessed  Upper Extremity Assessment  Upper Extremity Assessment Generalized weakness;LUE deficits/detail (L appeasr slightly weaker than R;)  LUE Deficits / Details generally weaker than L; @ 90 degrees FF; states sensation is different - mostly elbow - hand  LUE Sensation decreased light touch  Lower Extremity Assessment  Lower Extremity Assessment Defer to PT evaluation (B knee pain)  Cervical / Trunk Assessment  Cervical / Trunk Assessment Kyphotic  ADL  Overall ADL's  Needs assistance/impaired  Grooming Set up  Upper Body Bathing Set up  Lower Body Bathing Minimal assistance;Sit to/from stand  Upper Body Dressing  Set up  Lower Body Dressing Minimal assistance  Toilet Transfer Minimal assistance;RW  Toileting- Clothing Manipulation and Hygiene Minimal assistance  Functional mobility during ADLs Minimal assistance;Rolling walker;Cueing for safety  General ADL Comments limited by B knee pain  Vision- History  Baseline Vision/History 1 Wears glasses  Patient Visual Report Blurring of vision  Vision- Assessment  Vision Assessment? Yes;Vision impaired- to be further tested in functional context  Eye Alignment [facility]  Alignment/Gaze Preference WDL  Tracking/Visual Pursuits Decreased smoothness of horizontal tracking;Decreased smoothness of vertical tracking  Saccades Undershoots;Decreased speed of saccadic movement;Impaired - to be further tested in functional context  Visual Fields Other (comment) (appears  intact)  Additional Comments Pt states vision has been "blurry" for @ 3 weeks  Perception  Comments appeasr intact  Praxis  Praxis tested? WFL  Bed Mobility  Overal bed mobility Needs Assistance  Bed Mobility Supine to Sit;Sit to Supine  Supine to sit Min  assist  Sit to supine Min assist  General bed mobility comments increased time  Transfers  Overall transfer level Needs assistance  Equipment used Rolling walker (2 wheeled)  Transfers Sit to/from Stand  Sit to Stand Min assist  Stand pivot transfers Min assist  General transfer comment VC for safety  Balance  Overall balance assessment Needs assistance  Sitting balance-Leahy Scale Fair  Standing balance-Leahy Scale Poor  Standing balance comment requiring RW  General Comments  General comments (skin integrity, edema, etc.) daughter present adn educated on recommendation for rehab at [facility]  Exercises  Exercises Other exercises  Other Exercises  Other Exercises ankle pumps  Other Exercises encouraged to not keep pillows under knees  Other Exercises BUE general AROM through all planes  OT - End of Session  Equipment Utilized During Treatment Gait belt;Rolling walker  Activity Tolerance Patient tolerated treatment well  Patient left in chair;with call bell/phone within reach;with family/visitor present  Nurse Communication Mobility status  OT Assessment  OT Recommendation/Assessment Patient needs continued OT Services  OT Visit Diagnosis Unsteadiness on feet (R26.81);Other abnormalities of gait and mobility (R26.89);Muscle weakness (generalized) (M62.81);Low vision, both eyes (H54.2);Dizziness and giddiness (R42);Pain  Pain - part of body  (B knee pain)  OT Problem List Decreased strength;Decreased range of motion;Decreased activity tolerance;Impaired balance (sitting and/or standing);Impaired vision/perception;Decreased safety awareness;Decreased knowledge of use of DME or AE;Cardiopulmonary status limiting activity;Obesity;Pain  OT Plan  OT Frequency (ACUTE ONLY) Min 2X/week  OT Treatment/Interventions (ACUTE ONLY) Self-care/ADL training;Therapeutic exercise;Energy conservation;DME and/or AE instruction;Therapeutic activities;Visual/perceptual remediation/compensation;Patient/family  education;Balance training  AM-PAC OT "6 Clicks" Daily Activity Outcome Measure (Version 2)  Help from another person eating meals? 4  Help from another person taking care of personal grooming? 3  Help from another person toileting, which includes using toliet, bedpan, or urinal? 3  Help from another person bathing (including washing, rinsing, drying)? 3  Help from another person to put on and taking off regular upper body clothing? 3  Help from another person to put on and taking off regular lower body clothing? 3  6 Click Score 19  Progressive Mobility  What is the highest level of mobility based on the progressive mobility assessment? Level 3 (Stands with assist) - Balance while standing  and cannot march in place  Mobility Out of bed for toileting;Out of bed to chair with meals  OT Recommendation  Follow Up Recommendations SNF  Individuals Consulted  Consulted and Agree with Results and Recommendations Patient;Family member/caregiver  Family Member Consulted daughter  Acute Rehab OT Goals  Patient Stated Goal return to walking; agreeable to SNF  OT Goal Formulation With patient/family  Time For Goal Achievement 08/11/21  Potential to Achieve Goals Good  OT Time Calculation  OT Start Time (ACUTE ONLY) 1451  OT Stop Time (ACUTE ONLY) 1518  OT Time Calculation (min) 27 min  OT General Charges  $OT Visit 1 Visit  OT Evaluation  $OT Eval Moderate Complexity 1 Mod  OT Treatments  $Self Care/Home Management  8-22 mins  Written Expression  Dominant Hand Right  Luisa Dago, OT/L   Acute OT Clinical Specialist Acute Rehabilitation Services Pager [phone] Office [phone]

## 2021-07-28 NOTE — Plan of Care (Signed)
Pt is alert oriented x 4. Pt c/o pain to bilateral knees due to arthritis pain, pt takes scheduled tylenol at home. Upon admission medication were not able to be reconciled as pt stated she didn't know what they were. However she stated they were reconciled in ED.  Pt has purewick in place. Pt lives alone and uses walker at home. Pts daughter helps her get to appts etc. NIH, 0.  Pt has a puree diet at home as she has not teeth. Pt resting at this time.    Problem: Education: Goal: Knowledge of General Education information will improve Description: Including pain rating scale, medication(s)/side effects and non-pharmacologic comfort measures Outcome: Progressing   Problem: Health Behavior/Discharge Planning: Goal: Ability to manage health-related needs will improve Outcome: Progressing   Problem: Clinical Measurements: Goal: Ability to maintain clinical measurements within normal limits will improve Outcome: Progressing Goal: Will remain free from infection Outcome: Progressing Goal: Diagnostic test results will improve Outcome: Progressing Goal: Respiratory complications will improve Outcome: Progressing Goal: Cardiovascular complication will be avoided Outcome: Progressing   Problem: Activity: Goal: Risk for activity intolerance will decrease Outcome: Progressing   Problem: Nutrition: Goal: Adequate nutrition will be maintained Outcome: Progressing   Problem: Coping: Goal: Level of anxiety will decrease Outcome: Progressing   Problem: Elimination: Goal: Will not experience complications related to bowel motility Outcome: Progressing Goal: Will not experience complications related to urinary retention Outcome: Progressing   Problem: Pain Managment: Goal: General experience of comfort will improve Outcome: Progressing   Problem: Safety: Goal: Ability to remain free from injury will improve Outcome: Progressing   Problem: Skin Integrity: Goal: Risk for impaired skin  integrity will decrease Outcome: Progressing   Problem: Coping: Goal: Will verbalize positive feelings about self Outcome: Progressing   Problem: Health Behavior/Discharge Planning: Goal: Ability to manage health-related needs will improve Outcome: Progressing   Problem: Self-Care: Goal: Ability to participate in self-care as condition permits will improve Outcome: Progressing

## 2021-07-28 NOTE — Progress Notes (Signed)
  Echocardiogram 2D Echocardiogram has been performed.  Jill Hernandez     

## 2021-07-28 NOTE — NC FL2 (Signed)
[place] MEDICAID FL2 LEVEL OF CARE SCREENING TOOL     IDENTIFICATION  Patient Name: Jill Hernandez Birthdate: [birth year] Sex: female Admission Date (Current Location): 07/27/2021  [facility] and [place] Number:  Producer, television/film/video and Address:  [facility] [place], [address] [place], [place] [address]      Provider Number: [id number]  Attending Physician Name and Address:  Maretta Bees, MD  Relative Name and Phone Number:       Current Level of Care: Hospital Recommended Level of Care: Skilled Nursing Facility Prior Approval Number:    Date Approved/Denied:   PASRR Number: [id number]  Discharge Plan: SNF    Current Diagnoses: Patient Active Problem List   Diagnosis Date Noted   Left sided numbness 07/27/2021   Knee osteoarthritis 07/27/2021   GERD (gastroesophageal reflux disease) 07/27/2021   Anemia 07/27/2021   Acute CVA (cerebrovascular accident) (HCC) 07/27/2021   CAP (community acquired pneumonia) 04/09/2019   Small vessel disease, cerebrovascular 02/28/2016   Cerebral atrophy (HCC) 02/28/2016   Hyponatremia 02/27/2016   Syncope 04/29/2014   TIA (transient ischemic attack) 04/29/2014   HTN (hypertension) 04/29/2014   HLD (hyperlipidemia) 04/29/2014   Left knee pain 04/29/2014    Orientation RESPIRATION BLADDER Height & Weight     Self, Time, Situation, Place  Normal Continent Weight: 75.6 kg Height:  5\' 2"  (157.5 cm)  BEHAVIORAL SYMPTOMS/MOOD NEUROLOGICAL BOWEL NUTRITION STATUS      Continent Diet (dysphagia 3 with thin liquids)  AMBULATORY STATUS COMMUNICATION OF NEEDS Skin   Extensive Assist Verbally Skin abrasions (abrasion to right leg with foam dressing)                       Personal Care Assistance Level of Assistance  Bathing, Feeding, Dressing Bathing Assistance: Maximum assistance Feeding assistance: Limited assistance Dressing Assistance: Limited assistance     Functional Limitations Info  Sight,  Hearing, Speech Sight Info: Adequate Hearing Info: Impaired Speech Info: Adequate    SPECIAL CARE FACTORS FREQUENCY  PT (By licensed PT), OT (By licensed OT)     PT Frequency: 5x/wk OT Frequency: 5x/wk            Contractures Contractures Info: Not present    Additional Factors Info  Code Status, Allergies Code Status Info: Full Allergies Info: tape/ sulfa antibiotics           Current Medications (07/28/2021):  This is the current hospital active medication list Current Facility-Administered Medications  Medication Dose Route Frequency Provider Last Rate Last Admin   0.9 %  sodium chloride infusion  250 mL Intravenous PRN 09/27/2021, MD       acetaminophen (TYLENOL) tablet 650 mg  650 mg Oral Q8H Orland Mustard, MD   650 mg at 07/28/21 1324   aspirin chewable tablet 81 mg  81 mg Oral Q1500 09/27/21, MD   81 mg at 07/28/21 1538   clopidogrel (PLAVIX) tablet 75 mg  75 mg Oral Daily 09/27/21, PA-C   75 mg at 07/28/21 1110   diclofenac Sodium (VOLTAREN) 1 % topical gel 4 g  4 g Topical QID 09/27/21, MD   4 g at 07/28/21 1327   enoxaparin (LOVENOX) injection 40 mg  40 mg Subcutaneous Q24H 09/27/21, MD   40 mg at 07/27/21 1954   metoprolol tartrate (LOPRESSOR) injection 5 mg  5 mg Intravenous Q6H PRN 07/29/21, MD       pantoprazole (PROTONIX)  EC tablet 80 mg  80 mg Oral Daily Orland Mustard, MD   80 mg at 07/28/21 1110   pravastatin (PRAVACHOL) tablet 20 mg  20 mg Oral q1800 Orland Mustard, MD       senna-docusate (Senokot-S) tablet 1 tablet  1 tablet Oral QHS PRN Orland Mustard, MD       sodium chloride flush (NS) 0.9 % injection 3 mL  3 mL Intravenous Once Jacalyn Lefevre, MD       sodium chloride flush (NS) 0.9 % injection 3 mL  3 mL Intravenous Q12H Orland Mustard, MD   3 mL at 07/28/21 1322   sodium chloride flush (NS) 0.9 % injection 3 mL  3 mL Intravenous PRN Orland Mustard, MD         Discharge Medications: Please see discharge  summary for a list of discharge medications.  Relevant Imaging Results:  Relevant Lab Results:   Additional Information SS#: [id number]---VQX had 3 covid vaccines (Moderna)  Kermit Balo, RN

## 2021-07-29 ENCOUNTER — Observation Stay (HOSPITAL_COMMUNITY): Payer: Medicare Other

## 2021-07-29 ENCOUNTER — Encounter (HOSPITAL_COMMUNITY): Payer: Medicare Other

## 2021-07-29 DIAGNOSIS — I639 Cerebral infarction, unspecified: Secondary | ICD-10-CM

## 2021-07-29 DIAGNOSIS — Z7982 Long term (current) use of aspirin: Secondary | ICD-10-CM | POA: Diagnosis not present

## 2021-07-29 DIAGNOSIS — M6281 Muscle weakness (generalized): Secondary | ICD-10-CM | POA: Diagnosis not present

## 2021-07-29 DIAGNOSIS — R279 Unspecified lack of coordination: Secondary | ICD-10-CM | POA: Diagnosis not present

## 2021-07-29 DIAGNOSIS — E785 Hyperlipidemia, unspecified: Secondary | ICD-10-CM | POA: Diagnosis present

## 2021-07-29 DIAGNOSIS — R1310 Dysphagia, unspecified: Secondary | ICD-10-CM | POA: Diagnosis not present

## 2021-07-29 DIAGNOSIS — I679 Cerebrovascular disease, unspecified: Secondary | ICD-10-CM | POA: Diagnosis not present

## 2021-07-29 DIAGNOSIS — Z6829 Body mass index (BMI) 29.0-29.9, adult: Secondary | ICD-10-CM | POA: Diagnosis not present

## 2021-07-29 DIAGNOSIS — I720 Aneurysm of carotid artery: Secondary | ICD-10-CM | POA: Diagnosis present

## 2021-07-29 DIAGNOSIS — Z20822 Contact with and (suspected) exposure to covid-19: Secondary | ICD-10-CM | POA: Diagnosis present

## 2021-07-29 DIAGNOSIS — Z87440 Personal history of urinary (tract) infections: Secondary | ICD-10-CM | POA: Diagnosis not present

## 2021-07-29 DIAGNOSIS — Z974 Presence of external hearing-aid: Secondary | ICD-10-CM | POA: Diagnosis not present

## 2021-07-29 DIAGNOSIS — R29701 NIHSS score 1: Secondary | ICD-10-CM | POA: Diagnosis present

## 2021-07-29 DIAGNOSIS — H538 Other visual disturbances: Secondary | ICD-10-CM | POA: Diagnosis present

## 2021-07-29 DIAGNOSIS — M5001 Cervical disc disorder with myelopathy,  high cervical region: Secondary | ICD-10-CM | POA: Diagnosis present

## 2021-07-29 DIAGNOSIS — K219 Gastro-esophageal reflux disease without esophagitis: Secondary | ICD-10-CM | POA: Diagnosis present

## 2021-07-29 DIAGNOSIS — E669 Obesity, unspecified: Secondary | ICD-10-CM | POA: Diagnosis present

## 2021-07-29 DIAGNOSIS — R200 Anesthesia of skin: Secondary | ICD-10-CM | POA: Diagnosis present

## 2021-07-29 DIAGNOSIS — Z7401 Bed confinement status: Secondary | ICD-10-CM | POA: Diagnosis not present

## 2021-07-29 DIAGNOSIS — R41841 Cognitive communication deficit: Secondary | ICD-10-CM | POA: Diagnosis not present

## 2021-07-29 DIAGNOSIS — Z90710 Acquired absence of both cervix and uterus: Secondary | ICD-10-CM | POA: Diagnosis not present

## 2021-07-29 DIAGNOSIS — M170 Bilateral primary osteoarthritis of knee: Secondary | ICD-10-CM | POA: Diagnosis present

## 2021-07-29 DIAGNOSIS — Z87891 Personal history of nicotine dependence: Secondary | ICD-10-CM | POA: Diagnosis not present

## 2021-07-29 DIAGNOSIS — Z8673 Personal history of transient ischemic attack (TIA), and cerebral infarction without residual deficits: Secondary | ICD-10-CM | POA: Diagnosis not present

## 2021-07-29 DIAGNOSIS — I10 Essential (primary) hypertension: Secondary | ICD-10-CM | POA: Diagnosis present

## 2021-07-29 DIAGNOSIS — D649 Anemia, unspecified: Secondary | ICD-10-CM | POA: Diagnosis present

## 2021-07-29 DIAGNOSIS — Z8701 Personal history of pneumonia (recurrent): Secondary | ICD-10-CM | POA: Diagnosis not present

## 2021-07-29 DIAGNOSIS — I69391 Dysphagia following cerebral infarction: Secondary | ICD-10-CM | POA: Diagnosis not present

## 2021-07-29 DIAGNOSIS — M2550 Pain in unspecified joint: Secondary | ICD-10-CM | POA: Diagnosis not present

## 2021-07-29 DIAGNOSIS — Z79899 Other long term (current) drug therapy: Secondary | ICD-10-CM | POA: Diagnosis not present

## 2021-07-29 DIAGNOSIS — R2681 Unsteadiness on feet: Secondary | ICD-10-CM | POA: Diagnosis not present

## 2021-07-29 DIAGNOSIS — H9193 Unspecified hearing loss, bilateral: Secondary | ICD-10-CM | POA: Diagnosis present

## 2021-07-29 DIAGNOSIS — R2689 Other abnormalities of gait and mobility: Secondary | ICD-10-CM | POA: Diagnosis not present

## 2021-07-29 DIAGNOSIS — G8324 Monoplegia of upper limb affecting left nondominant side: Secondary | ICD-10-CM | POA: Diagnosis present

## 2021-07-29 LAB — URINE CULTURE: Culture: <10000 — AB

## 2021-07-29 MED ORDER — PANTOPRAZOLE SODIUM 40 MG PO TBEC: 40.0000 mg | DELAYED_RELEASE_TABLET | Freq: Every day | ORAL | Status: AC

## 2021-07-29 MED ORDER — CLOPIDOGREL BISULFATE 75 MG PO TABS: 75.0000 mg | ORAL_TABLET | Freq: Every day | ORAL | Status: DC

## 2021-07-29 NOTE — Plan of Care (Signed)
  Problem: Education: Goal: Knowledge of General Education information will improve Description: Including pain rating scale, medication(s)/side effects and non-pharmacologic comfort measures Outcome: Adequate for Discharge   Problem: Health Behavior/Discharge Planning: Goal: Ability to manage health-related needs will improve Outcome: Adequate for Discharge   Problem: Clinical Measurements: Goal: Ability to maintain clinical measurements within normal limits will improve Outcome: Adequate for Discharge Goal: Will remain free from infection Outcome: Adequate for Discharge Goal: Diagnostic test results will improve Outcome: Adequate for Discharge Goal: Respiratory complications will improve Outcome: Adequate for Discharge Goal: Cardiovascular complication will be avoided Outcome: Adequate for Discharge   Problem: Activity: Goal: Risk for activity intolerance will decrease Outcome: Adequate for Discharge   Problem: Nutrition: Goal: Adequate nutrition will be maintained Outcome: Adequate for Discharge   Problem: Coping: Goal: Level of anxiety will decrease Outcome: Adequate for Discharge   Problem: Elimination: Goal: Will not experience complications related to bowel motility Outcome: Adequate for Discharge Goal: Will not experience complications related to urinary retention Outcome: Adequate for Discharge   Problem: Pain Managment: Goal: General experience of comfort will improve Outcome: Adequate for Discharge   Problem: Safety: Goal: Ability to remain free from injury will improve Outcome: Adequate for Discharge   Problem: Skin Integrity: Goal: Risk for impaired skin integrity will decrease Outcome: Adequate for Discharge   Problem: Coping: Goal: Will verbalize positive feelings about self Outcome: Adequate for Discharge   Problem: Health Behavior/Discharge Planning: Goal: Ability to manage health-related needs will improve Outcome: Adequate for Discharge    Problem: Self-Care: Goal: Ability to participate in self-care as condition permits will improve Outcome: Adequate for Discharge   Problem: Education: Goal: Knowledge of disease or condition will improve Outcome: Adequate for Discharge Goal: Knowledge of secondary prevention will improve Outcome: Adequate for Discharge Goal: Knowledge of patient specific risk factors addressed and post discharge goals established will improve Outcome: Adequate for Discharge Goal: Individualized Educational Video(s) Outcome: Adequate for Discharge

## 2021-07-29 NOTE — Progress Notes (Signed)
Physical Therapy Treatment Patient Details Name: Jill Hernandez MRN: [id number] DOB: 04-26-1930 Today's Date: 07/29/2021    History of Present Illness Pt is 87 yo female who presented with L upper extremity weakness and numbess on 07/27/21 and found to have subacute infarcts in R frontal lobe. Pt with hx of HTN, HLD, GERD,OA,  multiple TIA    PT Comments    Pt admitted with above diagnosis. Pt was able to ambulate a few steps to the 3n1 and then to the recliner. Limited by pain in right knee as well left knee with right knee with greater pain.  Messaged MD to let him know that pain is limiting her mobility and he is going to get Ortho to consult at the SNF.  Pt currently with functional limitations due to balance and endurance deficits. Pt will benefit from skilled PT to increase their independence and safety with mobility to allow discharge to the venue listed below.      Follow Up Recommendations  SNF     Equipment Recommendations  None recommended by PT    Recommendations for Other Services       Precautions / Restrictions Precautions Precautions: Fall Precaution Comments: watch bp (orthostatic) Restrictions Weight Bearing Restrictions: No    Mobility  Bed Mobility Overal bed mobility: Needs Assistance Bed Mobility: Supine to Sit;Sit to Supine     Supine to sit: Min assist Sit to supine: Min assist   General bed mobility comments: increased time    Transfers Overall transfer level: Needs assistance Equipment used: Rolling walker (2 wheeled) Transfers: Sit to/from Stand Sit to Stand: Min assist Stand pivot transfers: Min assist       General transfer comment: VC for safety, pivot to 3N1 and urinated. Then needed assist to clean and then pivotal steps to recliner.  Ambulation/Gait Ambulation/Gait assistance: Mod assist Gait Distance (Feet): 2 Feet (2 feet x 2) Assistive device: Rolling walker (2 wheeled) Gait Pattern/deviations: Step-to pattern;Decreased  stride length;Shuffle Gait velocity: decreased Gait velocity interpretation: <1.31 ft/sec, indicative of household ambulator General Gait Details: Ambulated a few feet to 3n1 and then a few feet to recliner.  Pt limited by significant right knee pain.  Messaged MD regarding this as pt has had injection in knee prior to admit. MD wrote for an Ortho consult once pt gets to the SNF as pt is going this pm.  Pt and daughter agree.  No dizziness today with BP 123/65 in supine with HR 74 bpm, 115/64 in sitting with HR 80, Standing 101/66 with HR 99 bpm.  Once in chair 129/72 and HR with activity 120 bpm.  No dizziness per pt.  Pt requires mod A for balance and weight shifting, difficulty advancing feet.   Stairs             Wheelchair Mobility    Modified Rankin (Stroke Patients Only) Modified Rankin (Stroke Patients Only) Pre-Morbid Rankin Score: Moderate disability Modified Rankin: Moderately severe disability     Balance Overall balance assessment: Needs assistance Sitting-balance support: No upper extremity supported Sitting balance-Leahy Scale: Fair     Standing balance support: Bilateral upper extremity supported Standing balance-Leahy Scale: Poor Standing balance comment: requiring RW                            Cognition Arousal/Alertness: Awake/alert Behavior During Therapy: WFL for tasks assessed/performed Overall Cognitive Status: History of cognitive impairments - at baseline  General Comments: Very sharp, some limitation from [facility] [employer]      Exercises Other Exercises Other Exercises: ankle pumps, QS and LAQ Other Exercises: BUE general AROM through all planes    General Comments General comments (skin integrity, edema, etc.): daughter present      Pertinent Vitals/Pain Pain Assessment: Faces Pain Score: 8  Faces Pain Scale: No hurt Pain Location: bil knees (right > left), legs, feet (from RA) Pain  Descriptors / Indicators: Discomfort;Grimacing Pain Intervention(s): Limited activity within patient's tolerance;Monitored during session;Repositioned;Patient requesting pain meds-RN notified    Home Living     Available Help at Discharge: Family;Available PRN/intermittently Type of Home: Independent living facility              Prior Function            PT Goals (current goals can now be found in the care plan section) Acute Rehab PT Goals Patient Stated Goal: return to walking; agreeable to SNF Progress towards PT goals: Progressing toward goals    Frequency    Min 3X/week      PT Plan Current plan remains appropriate    Co-evaluation              AM-PAC PT "6 Clicks" Mobility   Outcome Measure  Help needed turning from your back to your side while in a flat bed without using bedrails?: A Little Help needed moving from lying on your back to sitting on the side of a flat bed without using bedrails?: A Little Help needed moving to and from a bed to a chair (including a wheelchair)?: A Lot Help needed standing up from a chair using your arms (e.g., wheelchair or bedside chair)?: A Lot Help needed to walk in hospital room?: A Lot Help needed climbing 3-5 steps with a railing? : Total 6 Click Score: 13    End of Session Equipment Utilized During Treatment: Gait belt Activity Tolerance: Patient limited by pain Patient left: with call bell/phone within reach;with family/visitor present;in chair;with chair alarm set Nurse Communication: Mobility status;Patient requests pain meds PT Visit Diagnosis: Unsteadiness on feet (R26.81);Muscle weakness (generalized) (M62.81)     Time: 8242-3536 PT Time Calculation (min) (ACUTE ONLY): 39 min  Charges:  $Gait Training: 8-22 mins $Therapeutic Exercise: 8-22 mins $Therapeutic Activity: 8-22 mins                     Jill Hernandez M,PT Acute Rehab Services [phone] (pager)    Jill Hernandez 07/29/2021, 2:18  PM

## 2021-07-29 NOTE — Evaluation (Signed)
Speech Language Pathology Evaluation Patient Details Name: Jill Hernandez MRN: [id number] DOB: 09-Mar-1930 Today's Date: 07/29/2021 Time: 4098-1191 SLP Time Calculation (min) (ACUTE ONLY): 24 min  Problem List:  Patient Active Problem List   Diagnosis Date Noted   Left sided numbness 07/27/2021   Knee osteoarthritis 07/27/2021   GERD (gastroesophageal reflux disease) 07/27/2021   Anemia 07/27/2021   Acute CVA (cerebrovascular accident) (HCC) 07/27/2021   CAP (community acquired pneumonia) 04/09/2019   Small vessel disease, cerebrovascular 02/28/2016   Cerebral atrophy (HCC) 02/28/2016   Hyponatremia 02/27/2016   Syncope 04/29/2014   TIA (transient ischemic attack) 04/29/2014   HTN (hypertension) 04/29/2014   HLD (hyperlipidemia) 04/29/2014   Left knee pain 04/29/2014   Past Medical History:  Past Medical History:  Diagnosis Date   GERD (gastroesophageal reflux disease)    Hyperlipemia    Hypertension    TIA (transient ischemic attack)    Past Surgical History:  Past Surgical History:  Procedure Laterality Date   ABDOMINAL HYSTERECTOMY     TONSILLECTOMY     HPI:  Pt is an 87 yo female who presented with L upper extremity weakness and numbess on 07/27/21 and found to have subacute infarcts in R frontal lobe. Pt with hx of HTN, HLD, GERD,OA,  multiple TIA   Assessment / Plan / Recommendation Clinical Impression  Pt participated in speech/language/cognition evaluation. She reported that she is retired and lives in an [place]. Per the pt, her family helps her fill her pillbox "because they don't trust me to do it". When asked whether her family should trust her with this task, pt stated, "No, my memory isn't what it used to be". Pt denied any baseline deficits in speech, language, or cognition. The [facility] Mental Status Examination was completed to evaluate the pt's cognitive-linguistic skills. She achieved a score of 26/30 which is below the normal limits of 27 or  more out of 30 and is suggestive of a mild impairment. She exhibited difficulty in the areas of memory and executive function. No speech or language deficits were noted. Pt reported that she believes she is currently at baseline and her performance is functional considering her level of support. Further skilled SLP services are not clinically indicated at this time.    SLP Assessment  SLP Recommendation/Assessment: Patient does not need any further Speech Lanaguage Pathology Services SLP Visit Diagnosis: Cognitive communication deficit (R41.841)    Follow Up Recommendations  None    Frequency and Duration           SLP Evaluation Cognition  Overall Cognitive Status: History of cognitive impairments - at baseline Arousal/Alertness: Awake/alert Orientation Level: Oriented X4 Year: 2022 Month: September Day of Week: Correct Attention: Focused;Sustained Focused Attention: Appears intact Sustained Attention: Appears intact Memory: Impaired Memory Impairment: Retrieval deficit;Decreased recall of new information (Immediate: 5/5; delayed: 2/5) Awareness: Appears intact Problem Solving: Appears intact Executive Function: Reasoning;Sequencing;Organizing Reasoning: Appears intact Sequencing: Impaired Sequencing Impairment: Verbal complex (clock drawing: 2/4) Organizing: Appears intact (backward digit span: 3/3 with additional processing time.) Safety/Judgment: Appears intact       Comprehension  Auditory Comprehension Overall Auditory Comprehension: Appears within functional limits for tasks assessed Yes/No Questions: Within Functional Limits Conversation: Complex    Expression Expression Primary Mode of Expression: Verbal Verbal Expression Overall Verbal Expression: Appears within functional limits for tasks assessed Initiation: No impairment Level of Generative/Spontaneous Verbalization: Conversation Repetition: No impairment Naming: No impairment Pragmatics: No  impairment Written Expression Dominant Hand: Right   Oral /  Motor  Oral Motor/Sensory Function Overall Oral Motor/Sensory Function: Within functional limits Motor Speech Overall Motor Speech: Appears within functional limits for tasks assessed Respiration: Within functional limits Phonation: Normal Resonance: Within functional limits Articulation: Within functional limitis Intelligibility: Intelligible Motor Planning: Witnin functional limits    Shaunee Mulkern I. Vear Clock, MS, CCC-SLP Acute Rehabilitation Services Office number [phone] Pager [phone]                   Scheryl Marten 07/29/2021, 1:43 PM

## 2021-07-29 NOTE — TOC Transition Note (Signed)
Transition of Care [facility]) - CM/SW Discharge Note   Patient Details  Name: Jill Hernandez MRN: [id number] Date of Birth: Dec 20, 1929  Transition of [facility]) CM/SW Contact:  Kermit Balo, RN Phone Number: 07/29/2021, 12:33 PM   Clinical Narrative:    Patient is discharging to [facility] today. She will transport via [facility]. D/c packet is at the desk and bedside RN updated.  Number for report: [phone]   Final next level of care: Skilled Nursing Facility Barriers to Discharge: No Barriers Identified   Patient Goals and CMS Choice   CMS Medicare.gov Compare Post Acute Care list provided to:: Patient Represenative (must comment) Choice offered to / list presented to : Adult Children  Discharge Placement PASRR number recieved: 07/28/21            Patient chooses bed at: [facility] Patient to be transferred to facility by: [facility] Name of family member notified: Daughter Patient and family notified of of transfer: 07/29/21  Discharge Plan and Services In-house Referral: Clinical Social Work Discharge Planning Services: [employer] Consult Post Acute Care Choice: Skilled Nursing Facility                               Social Determinants of Health (SDOH) Interventions     Readmission Risk Interventions No flowsheet data found.

## 2021-07-29 NOTE — Discharge Summary (Addendum)
PATIENT DETAILS Name: Jill Hernandez Age: 87 y.o. Sex: female Date of Birth: 1930/07/20 MRN: [id number]. Admitting Physician: Maretta Bees, MD WUJ:[id number], Rulon Eisenmenger  Admit Date: 07/27/2021 Discharge date: 07/29/2021  Recommendations for Outpatient Follow-up:  Follow up with PCP in 1-2 weeks Please obtain CMP/CBC in one week Please ensure outpatient follow-up with neurology. Please follow official results of carotid Doppler-preliminary results do not show any significant stenosis. Aspirin/Plavix x3 weeks followed by aspirin alone Please ensure outpatient follow-up with orthopedics-patient has chronic OA of her knees.  Admitted From:  Home  Disposition: SNF   Home Health: No  Equipment/Devices: None  Discharge Condition: Stable  CODE STATUS: FULL CODE  Diet recommendation:  Diet Order             Diet - low sodium heart healthy           DIET DYS 3 Room service appropriate? Yes; Fluid consistency: Thin  Diet effective ____                    Brief Narrative: Patient is a 87 y.o. female with history of HTN, HLD, GERD, multiple TIAs-presented with left upper extremity numbness-upon further evaluation she was found to have acute CVA and admitted to the hospitalist service.  See below for further details.   Significant events: 8/31>> presented with left upper extremity numbness-further evaluation found to have acute CVA.   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 8/31>> MRI brain: Subacute infarcts in the right frontal lobe. 8/31>> MRA brain: No significant stenosis. 9/01>> MRI C-spine: Moderate to severe spinal canal stenosis C2-C3 with mild mass-effect on the cord-but no evidence of cord signal abnormality.  Moderate spinal canal stenosis at C5-C6 and C6-C7. 9/01>> Echo: Pending 9/01>> LDL: 62 9/01>> A1c: 6.5 9/01>> MRI C-spine:  Moderate/severe spinal canal stenosis with mild mass-effect on the cord but no evidence of cord signal abnormality. 9/02>> carotid Doppler: No significant stenosis (preliminary results)   Antimicrobial therapy: None   Microbiology data: 8/31>> urine culture: Pending 8/31>> COVID/influenza PCR: Negative   Procedures : None   Consults: Neurology  Brief Hospital Course: Acute CVA: Left upper extremity numbness has resolved-work-up as above-evaluated by neurology-recommendations are for aspirin/Plavix for 3 weeks followed by aspirin alone.   Cervical myelopathy from degenerative disc disease: See MRI findings above-neurology discussed with patient/family-plans are to manage this with conservative measures-family does not want to pursue surgical decompression.  This may be the course of intermittent left upper extremity numbness   Recent UTI: Denies any dysuria to me this morning-UA on 8/31 was completely clear.  Urine cultures pending.  No indication for antibiotics.   2 mm left ICA paraophthalmic aneurysm: Seen incidentally on CTA a few days back-probably stable for further follow-up/surveillance to be done in the outpatient setting.   HTN: BP stable-allowing permissive hypertension in setting of CVA.  Plan is to resume low-dose amlodipine/Coreg on discharge.   Normocytic anemia: At baseline-stable for further work-up in the outpatient setting.   GERD: Continue PPI   OA bilateral knees: Supportive care   Obesity: Estimated body mass index is 30.48 kg/m as calculated from the following:   Height as of this encounter:  (1.575 m).   Weight as of this encounter: 75.6 kg.      Procedures None  Discharge Diagnoses:  Principal Problem:   Acute CVA (cerebrovascular accident) [facility]) Active Problems:  HTN (hypertension)   HLD (hyperlipidemia)   Left sided numbness   Knee osteoarthritis   GERD (gastroesophageal reflux disease)   Anemia   Discharge  Instructions:  Activity:  As tolerated with Full fall precautions use walker/cane & assistance as needed   Discharge Instructions     Ambulatory referral to Neurology   Complete by: As directed    An appointment is requested in approximately: 8 weeks   Diet - low sodium heart healthy   Complete by: As directed    Increase activity slowly   Complete by: As directed       Allergies as of 07/29/2021       Reactions   Tape Other (See Comments)   SKIN IS THIN- WILL BRUISE AND TEAR EASILY!!   Sulfa Antibiotics Hives        Medication List     STOP taking these medications    omeprazole 40 MG capsule Commonly known as: PRILOSEC Replaced by: pantoprazole 40 MG tablet       TAKE these medications    acetaminophen 650 MG CR tablet Commonly known as: TYLENOL Take 1,300 mg by mouth every 8 (eight) hours.   amLODipine 2.5 MG tablet Commonly known as: NORVASC Take 2.5 mg by mouth daily in the afternoon.   Aspercreme Arthritis Pain 1 % Gel Generic drug: diclofenac Sodium Apply 2-4 g topically 4 (four) times daily as needed (for pain).   aspirin 81 MG chewable tablet Chew 81 mg by mouth daily at 3 pm.   Biofreeze 4 % Gel Generic drug: Menthol (Topical Analgesic) Apply 1 application topically 2 (two) times daily as needed (to painful areas).   carvedilol 12.5 MG tablet Commonly known as: COREG Take 12.5 mg by mouth 2 (two) times daily with a meal.   clopidogrel 75 MG tablet Commonly known as: PLAVIX Take 1 tablet (75 mg total) by mouth daily. Start taking on: July 30, 2021   lovastatin 20 MG tablet Commonly known as: MEVACOR Take 20 mg by mouth daily after supper.   pantoprazole 40 MG tablet Commonly known as: PROTONIX Take 1 tablet (40 mg total) by mouth daily. Start taking on: July 30, 2021 Replaces: omeprazole 40 MG capsule        Follow-up Information     Micki Riley, MD Follow up in 2 month(s).   Specialties: Neurology,  Radiology Contact information: [address] [place] [address] [phone]         Jarrett Soho, PA-C. Schedule an appointment as soon as possible for a visit in 1 week(s).   Specialty: Family Medicine Contact information: [address] [place] [address] [phone]                Allergies  Allergen Reactions   Tape Other (See Comments)    SKIN IS THIN- WILL BRUISE AND TEAR EASILY!!   Sulfa Antibiotics Hives      Consultations: Neuro   Other Procedures/Studies: CT Angio Head W or Wo Contrast  Addendum Date: 07/23/2021   ADDENDUM REPORT: 07/23/2021 08:58 ADDENDUM: Upon re-review of this CTA performed at 0503 hours today, the following findings are noted: 2 mm Left ICA paraophthalmic artery aneurysm, initially mistaken for the normal left ophthalmic artery origin on series 16, image 114. But with patent left ophthalmic artery confirmed just proximal to this lesion. This addition was discussed by telephone with Dr Lorre Nick on 07/23/2021 at 0846 hours. We discussed that given patient age and the small  size of this lesion, surveillance vessel imaging (e.g. annual with either MRA Head without contrast, or repeat CTA with contrast) may suffice. Electronically Signed   By: Odessa FlemingH  Hall M.D.   On: 07/23/2021 08:58   Result Date: 07/23/2021 CLINICAL DATA:  87 year old female with dizziness and blurred vision. EXAM: CT ANGIOGRAPHY HEAD TECHNIQUE: Multidetector CT imaging of the head was performed using the standard protocol during bolus administration of intravenous contrast. Multiplanar CT image reconstructions and MIPs were obtained to evaluate the vascular anatomy. CONTRAST:  75mL OMNIPAQUE IOHEXOL 350 MG/ML SOLN COMPARISON:  Brain MRI 03/23/2016.  Head CT 03/08/2016. FINDINGS: CT HEAD Brain: Cerebral volume is stable and within normal limits for age. No midline shift, ventriculomegaly, mass effect, evidence of mass lesion, intracranial hemorrhage or  evidence of cortically based acute infarction. Chronic small dystrophic calcifications in the right cerebellum are stable. A small area of chronic white matter hypodensity in the inferior right frontal gyrus is stable. Gray-white matter differentiation otherwise within normal limits for age. Calvarium and skull base: No acute osseous abnormality identified. Paranasal sinuses: Subtotal new right sphenoid sinus mucosal thickening and opacification. Chronic left maxillary retention cysts. Other Visualized paranasal sinuses and mastoids are stable and well aerated. Tympanic cavities remain clear. Orbits: Postoperative changes to both globes since 2017. Visualized scalp soft tissues are within normal limits. CTA HEAD Posterior circulation: Codominant distal vertebral arteries are patent to the basilar. No distal vertebral plaque or stenosis. Normal PICA origins. Patent basilar artery without stenosis. Normal SCA and PCA origins. Left posterior communicating artery is present and the right is diminutive or absent. Left PCA branches are within normal limits. There is mild right PCA P2 irregularity and stenosis on series 15, image 20. Anterior circulation: Distal cervical ICAs are patent, the right is tortuous. Both ICA siphons are patent. Bilateral distal siphon calcified plaque. No significant stenosis on the left. Normal left ophthalmic and posterior communicating artery origins. On the right there is moderate anterior genu and proximal supraclinoid stenosis. Normal right ophthalmic artery origin. Patent carotid termini, MCA and ACA origins. Anterior communicating artery and bilateral ACA branches are within normal limits. Left MCA M1 segment and trifurcation are patent without stenosis. Right MCA M1 segment and bifurcation are patent without stenosis. Bilateral MCA branches are within normal limits. Venous sinuses: Early contrast timing. The superior sagittal sinus appears to be patent. Anatomic variants: None. Review  of the MIP images confirms the above findings IMPRESSION: 1. Intracranial CTA is negative for large vessel occlusion. There is moderate stenosis of the Right ICA siphon due to calcified plaque. Mild right PCA P2 segment atherosclerosis. 2. No acute intracranial abnormality. Stable CT appearance of the brain since 2017. 3. Right sphenoid sinus inflammation is new since 2017. Electronically Signed: By: Odessa FlemingH  Hall M.D. On: 07/23/2021 05:16   MR ANGIO HEAD WO CONTRAST  Result Date: 07/27/2021 CLINICAL DATA:  Neuro deficit, acute, stroke suspected. EXAM: MRA HEAD WITHOUT CONTRAST TECHNIQUE: Angiographic images of the Circle of Willis were acquired using MRA technique without intravenous contrast. COMPARISON:  No pertinent prior exam. FINDINGS: POSTERIOR CIRCULATION: --Vertebral arteries: Normal --Inferior cerebellar arteries: Normal. --Basilar artery: Normal. --Superior cerebellar arteries: Normal. --Posterior cerebral arteries: Normal. ANTERIOR CIRCULATION: --Intracranial internal carotid arteries: Normal. --Anterior cerebral arteries (ACA): Normal. --Middle cerebral arteries (MCA): Normal. ANATOMIC VARIANTS: None IMPRESSION: Normal intracranial MRA. Electronically Signed   By: Deatra RobinsonKevin  Herman M.D.   On: 07/27/2021 21:55   MR BRAIN WO CONTRAST  Result Date: 07/27/2021 CLINICAL DATA:  Neuro deficit, acute,  stroke suspected EXAM: MRI HEAD WITHOUT CONTRAST TECHNIQUE: Multiplanar, multiecho pulse sequences of the brain and surrounding structures were obtained without intravenous contrast. COMPARISON:  Same-day CT head.  MRI February 27, 2016. FINDINGS: Motion limited study.  Within this limitation: Brain: Multiple small areas mild DWI hyperintensity in the high right frontal lobe (series 2, images 34-37). At least one of these lesions appears to have correlate ADC hypointensity (series 250, image 36) and therefore likely represents a recent infarct. The other areas are either hyperintense or isointense on ADC and may  represent subacute infarcts or artifactual T2 shine through. Mild T2 hyperintensity in these areas without mass effect. Additional mild for age scattered T2 hyperintensities in the supratentorial and pontine white matter, nonspecific but compatible with chronic microvascular ischemic disease. Small remote right cerebellar lacunar infarct. Mild for age atrophy. Vascular: Major arterial flow voids are maintained skull base. Skull and upper cervical spine: Normal marrow signal. Partially imaged upper cervical degenerative change with at least mild-to-moderate canal stenosis. Sinuses/Orbits: Mild paranasal sinus mucosal thickening. Left maxillary sinus retention cyst. No acute orbital findings. Other: No sizable mastoid effusions. IMPRESSION: Motion limited study. 1. Multiple small areas of mild DWI hyperintensity in the high right frontal lobe. At least one of these lesions appears to have correlate ADC hypointensity, suggestive of a recent infarct. The other areas are either hyperintense or isointense on ADC and may represent subacute infarcts and/or artifactual T2 shine through. 2. Mild for age chronic microvascular ischemic disease and atrophy. 3. Upper cervical spine degenerative change with at least mild-to-moderate canal stenosis. An MRI of the cervical spine could further evaluate if clinically indicated. Electronically Signed   By: Feliberto Harts M.D.   On: 07/27/2021 18:00   MR CERVICAL SPINE WO CONTRAST  Result Date: 07/28/2021 CLINICAL DATA:  Cervical radiculopathy EXAM: MRI CERVICAL SPINE WITHOUT CONTRAST TECHNIQUE: Multiplanar, multisequence MR imaging of the cervical spine was performed. No intravenous contrast was administered. COMPARISON:  None. FINDINGS: Alignment: There is 3 mm anterolisthesis of C2 on C3 and C3 on C4, likely degenerative in nature. There is reversal of the normal cervical spine lordosis with focal kyphosis centered at C4-C5. Vertebrae: Vertebral body heights are preserved,  without evidence of acute fracture. There is mild reactive marrow signal abnormality in the left posterior elements at C2 and C3. Marrow signal is otherwise normal. There is a prominent Schmorl's node indenting the superior C5 endplate. Cord: Normal signal and morphology. Posterior Fossa, vertebral arteries, paraspinal tissues: The paraspinal soft tissues are unremarkable. The vertebral artery flow voids are present. Disc levels: There is marked disc space narrowing at C4-C5 and C6-C7. There is mild-to-moderate disc desiccation and narrowing at the remaining levels. Craniocervical junction: There is prominent degenerative pannus about the dens resulting in mild narrowing of the craniocervical junction without mass effect on the underlying cord/cervicomedullary junction. C2-C3: There is a broad-based posterior disc osteophyte complex, ligamentum flavum thickening, and uncovertebral and facet arthropathy resulting in moderate to severe spinal canal stenosis with effacement of the thecal sac and mild mass effect on the cord, and moderate bilateral neural foraminal stenosis. There are small bilateral facet joint effusions. C3-C4: There is a mild posterior disc osteophyte complex, ligamentum flavum thickening, and uncovertebral and facet arthropathy resulting in mild spinal canal stenosis with effacement of the ventral thecal sac and severe bilateral neural foraminal stenosis. C4-C5: There is a posterior disc osteophyte complex with prominent uncovertebral spurring, ligamentum flavum thickening, and bilateral facet arthropathy resulting in mild-to-moderate spinal canal stenosis and  severe bilateral neural foraminal stenosis. C5-C6: There is a posterior disc osteophyte complex with a broad-based left paracentral protrusion, ligamentum flavum thickening, and uncovertebral and facet arthropathy resulting in moderate spinal canal stenosis with effacement of the thecal sac and severe bilateral neural foraminal stenosis C6-C7:  There is a posterior disc osteophyte complex with a broad-based left paracentral protrusion, ligamentum flavum thickening, and uncovertebral and facet arthropathy resulting in moderate spinal canal stenosis with effacement of the thecal sac and severe left worse than right neural foraminal stenosis C7-T1: There is degenerative endplate change and facet arthropathy resulting in moderate left and mild right neural foraminal stenosis without significant spinal canal stenosis. T1-T2: There is a mild disc bulge, degenerative endplate change, and bilateral facet arthropathy resulting in moderate left and mild right neural foraminal stenosis without significant spinal canal stenosis. IMPRESSION: 1. Grade 1 anterolisthesis of C2 on C3 and C3 on C4, likely degenerative in nature. 2. Moderate to severe spinal canal stenosis C2-C3 with mild mass effect on the cord but no evidence of cord signal abnormality. There is also moderate spinal canal stenosis at C5-C6 and C6-C7 without cord compression. 3. Additional advanced multilevel degenerative changes with severe neural foraminal stenosis at numerous levels described in detail above. 4. Mild reactive marrow signal abnormality in the posterior elements on the left C2-C3. Electronically Signed   By: Lesia Hausen M.D.   On: 07/28/2021 10:28   DG Chest Portable 1 View  Result Date: 07/23/2021 CLINICAL DATA:  Dizziness and blurry vision. EXAM: PORTABLE CHEST 1 VIEW COMPARISON:  Chest radiograph dated 04/09/2019. FINDINGS: Bilateral mid to lower lung field hazy densities may represent atelectasis or infiltrate. There is no pleural effusion pneumothorax. Stable cardiac silhouette. Atherosclerotic calcification the aorta. No acute osseous pathology. IMPRESSION: Bilateral mid to lower lung field atelectasis or infiltrate. Electronically Signed   By: Elgie Collard M.D.   On: 07/23/2021 03:07   ECHOCARDIOGRAM COMPLETE  Result Date: 07/28/2021    ECHOCARDIOGRAM REPORT   Patient  Name:   FENNA SEMEL Date of Exam: 07/28/2021 Medical Rec #:  [id number]        Height:       62.0 in Accession #:    [id number]       Weight:       166.7 lb Date of Birth:  06/02/1930        BSA:          1.769 m Patient Age:    89 years         BP:           147/59 mmHg Patient Gender: F                HR:           71 bpm. Exam Location:  Inpatient Procedure: 2D Echo, Cardiac Doppler and Color Doppler Indications:    Stroke I63.9  History:        Patient has prior history of Echocardiogram examinations, most                 recent 04/11/2019. TIA; Risk Factors:Hypertension and                 Dyslipidemia. GERD.  Sonographer:    Leta Jungling RDCS Referring Phys: [id number] ALLISON WOLFE IMPRESSIONS  1. Left ventricular ejection fraction, by estimation, is 60 to 65%. The left ventricle has normal function. The left ventricle has no regional wall motion abnormalities. Left ventricular diastolic parameters are consistent with  Grade I diastolic dysfunction (impaired relaxation).  2. Right ventricular systolic function is normal. The right ventricular size is normal. There is normal pulmonary artery systolic pressure.  3. The mitral valve is normal in structure. Mild mitral valve regurgitation. No evidence of mitral stenosis. Moderate mitral annular calcification.  4. The aortic valve is tricuspid. Aortic valve regurgitation is not visualized. Mild aortic valve sclerosis is present, with no evidence of aortic valve stenosis.  5. The inferior vena cava is normal in size with greater than 50% respiratory variability, suggesting right atrial pressure of 3 mmHg. Comparison(s): No significant change from prior study. Prior images reviewed side by side. Conclusion(s)/Recommendation(s): No intracardiac source of embolism detected on this transthoracic study. A transesophageal echocardiogram is recommended to exclude cardiac source of embolism if clinically indicated. FINDINGS  Left Ventricle: Left ventricular ejection  fraction, by estimation, is 60 to 65%. The left ventricle has normal function. The left ventricle has no regional wall motion abnormalities. The left ventricular internal cavity size was normal in size. There is  no left ventricular hypertrophy. Left ventricular diastolic parameters are consistent with Grade I diastolic dysfunction (impaired relaxation). Right Ventricle: The right ventricular size is normal. No increase in right ventricular wall thickness. Right ventricular systolic function is normal. There is normal pulmonary artery systolic pressure. The tricuspid regurgitant velocity is 2.78 m/s, and  with an assumed right atrial pressure of 3 mmHg, the estimated right ventricular systolic pressure is 33.9 mmHg. Left Atrium: Left atrial size was normal in size. Right Atrium: Right atrial size was normal in size. Pericardium: There is no evidence of pericardial effusion. Mitral Valve: The mitral valve is normal in structure. Moderate mitral annular calcification. Mild mitral valve regurgitation. No evidence of mitral valve stenosis. Tricuspid Valve: The tricuspid valve is normal in structure. Tricuspid valve regurgitation is mild . No evidence of tricuspid stenosis. Aortic Valve: The aortic valve is tricuspid. Aortic valve regurgitation is not visualized. Mild aortic valve sclerosis is present, with no evidence of aortic valve stenosis. Pulmonic Valve: The pulmonic valve was normal in structure. Pulmonic valve regurgitation is not visualized. No evidence of pulmonic stenosis. Aorta: The aortic root is normal in size and structure. Venous: The inferior vena cava is normal in size with greater than 50% respiratory variability, suggesting right atrial pressure of 3 mmHg. IAS/Shunts: No atrial level shunt detected by color flow Doppler.  LEFT VENTRICLE PLAX 2D LVIDd:         4.60 cm  Diastology LVIDs:         3.30 cm  LV e' medial:    4.50 cm/s LV PW:         0.80 cm  LV E/e' medial:  19.3 LV IVS:        0.80 cm  LV  e' lateral:   4.62 cm/s LVOT diam:     1.80 cm  LV E/e' lateral: 18.9 LV SV:         53 LV SV Index:   30 LVOT Area:     2.54 cm  RIGHT VENTRICLE RV S prime:     9.58 cm/s LEFT ATRIUM             Index LA diam:        4.00 cm 2.26 cm/m LA Vol (A2C):   26.3 ml 14.87 ml/m LA Vol (A4C):   22.9 ml 12.94 ml/m LA Biplane Vol: 25.2 ml 14.24 ml/m  AORTIC VALVE LVOT Vmax:   93.80 cm/s LVOT Vmean:  55.000  cm/s LVOT VTI:    0.210 m  AORTA Ao Root diam: 2.80 cm Ao Asc diam:  2.90 cm MITRAL VALVE                TRICUSPID VALVE MV Area (PHT): 3.39 cm     TR Peak grad:   30.9 mmHg MV Decel Time: 224 msec     TR Vmax:        278.00 cm/s MV E velocity: 87.10 cm/s MV A velocity: 110.50 cm/s  SHUNTS MV E/A ratio:  0.79         Systemic VTI:  0.21 m                             Systemic Diam: 1.80 cm Donato Schultz MD Electronically signed by Donato Schultz MD Signature Date/Time: 07/28/2021/1:42:02 PM    Final    CT HEAD CODE STROKE WO CONTRAST  Result Date: 07/27/2021 CLINICAL DATA:  Code stroke. Acute neuro deficit. Rule out stroke. Left-sided weakness EXAM: CT HEAD WITHOUT CONTRAST TECHNIQUE: Contiguous axial images were obtained from the base of the skull through the vertex without intravenous contrast. COMPARISON:  CT head 07/23/2021 FINDINGS: Brain: No evidence of acute infarction, hemorrhage, hydrocephalus, extra-axial collection or mass lesion/mass effect. Mild white matter changes most likely due to chronic microvascular ischemic change. Vascular: Negative for hyperdense vessel Skull: Negative Sinuses/Orbits: Extensive mucosal edema right sphenoid sinus unchanged. Mild mucosal edema maxillary sinus bilaterally. Bilateral cataract extraction. No orbital mass. Other: None ASPECTS ([place] Stroke Program Early CT Score) - Ganglionic level infarction (caudate, lentiform nuclei, internal capsule, insula, M1-M3 cortex): 7 - Supraganglionic infarction (M4-M6 cortex): 3 Total score (0-10 with 10 being normal): 10 IMPRESSION: 1. No  acute intracranial abnormality 2. ASPECTS is 10 3. Code stroke imaging results were communicated on 07/27/2021 at 3:45 pm to provider Pearlean Brownie via text page Electronically Signed   By: Marlan Palau M.D.   On: 07/27/2021 15:46   VAS US CAROTID  Result Date: 07/29/2021 Carotid Arterial Duplex Study Patient Name:  LATESIA NORRINGTON  Date of Exam:   07/29/2021 Medical Rec #: [id number]         Accession #:    [id number] Date of Birth: 07/21/1930         Patient Gender: F Patient Age:   34 years Exam Location:  [facility] Procedure:      VAS US CAROTID Referring Phys: Terrilee Files [facility] --------------------------------------------------------------------------------  Indications:       CVA. Risk Factors:      Hypertension, hyperlipidemia. Comparison Study:  02/28/16 prior Performing Technologist: [place] Ponder RVS  Examination Guidelines: A complete evaluation includes B-mode imaging, spectral Doppler, color Doppler, and power Doppler as needed of all accessible portions of each vessel. Bilateral testing is considered an integral part of a complete examination. Limited examinations for reoccurring indications may be performed as noted.  Right Carotid Findings: +----------+--------+--------+--------+------------------+--------+           PSV cm/sEDV cm/sStenosisPlaque DescriptionComments +----------+--------+--------+--------+------------------+--------+ CCA Prox  71      16              heterogenous               +----------+--------+--------+--------+------------------+--------+ CCA Distal63      15              heterogenous               +----------+--------+--------+--------+------------------+--------+ ICA Prox  61      8       1-39%   heterogenous               +----------+--------+--------+--------+------------------+--------+ ICA Distal59      19                                         +----------+--------+--------+--------+------------------+--------+ ECA       92      11                                          +----------+--------+--------+--------+------------------+--------+ +----------+--------+-------+--------+-------------------+           PSV cm/sEDV cmsDescribeArm Pressure (mmHG) +----------+--------+-------+--------+-------------------+ [id number]                                        +----------+--------+-------+--------+-------------------+ +---------+--------+--------+--------------+ VertebralPSV cm/sEDV cm/sNot identified +---------+--------+--------+--------------+  Left Carotid Findings: +----------+--------+--------+--------+------------------+--------+           PSV cm/sEDV cm/sStenosisPlaque DescriptionComments +----------+--------+--------+--------+------------------+--------+ CCA Prox  82                      heterogenous               +----------+--------+--------+--------+------------------+--------+ CCA Distal64      11              heterogenous               +----------+--------+--------+--------+------------------+--------+ ICA Prox  115     17      1-39%   heterogenous               +----------+--------+--------+--------+------------------+--------+ ICA Distal57      18                                         +----------+--------+--------+--------+------------------+--------+ ECA       124                                                +----------+--------+--------+--------+------------------+--------+ +----------+--------+--------+--------+-------------------+           PSV cm/sEDV cm/sDescribeArm Pressure (mmHG) +----------+--------+--------+--------+-------------------+ [id number]                                          +----------+--------+--------+--------+-------------------+ +---------+--------+--------+--------------+ VertebralPSV cm/sEDV cm/sNot identified +---------+--------+--------+--------------+   Summary: Right Carotid: Velocities in the right ICA are  consistent with a 1-39% stenosis. Left Carotid: Velocities in the left ICA are consistent with a 1-39% stenosis. Vertebrals: Bilateral vertebral arteries were not visualized. *See table(s) above for measurements and observations.     Preliminary      TODAY-DAY OF DISCHARGE:  Subjective:   Mandisa Persinger today has no headache,no chest abdominal pain,no new weakness tingling or numbness, feels much better wants to go home today.   Objective:   Blood pressure 140/63, pulse 83, temperature 99.2 F (37.3  C), temperature source Oral, resp. rate 18, height 5\' 2"  (1.575 m), weight 75.6 kg, SpO2 96 %.  Intake/Output Summary (Last 24 hours) at 07/29/2021 1049 Last data filed at 07/29/2021 0900 Gross per 24 hour  Intake 360 ml  Output 1100 ml  Net -740 ml   Filed Weights   07/27/21 1500 07/28/21 0044  Weight: 76.2 kg 75.6 kg    Exam: Awake Alert, Oriented *3, No new F.N deficits, Normal affect [place].AT,PERRAL Supple Neck,No JVD, No cervical lymphadenopathy appriciated.  Symmetrical Chest wall movement, Good air movement bilaterally, CTAB RRR,No Gallops,Rubs or new Murmurs, No Parasternal Heave +ve B.Sounds, Abd Soft, Non tender, No organomegaly appriciated, No rebound -guarding or rigidity. No Cyanosis, Clubbing or edema, No new Rash or bruise   PERTINENT RADIOLOGIC STUDIES: MR ANGIO HEAD WO CONTRAST  Result Date: 07/27/2021 CLINICAL DATA:  Neuro deficit, acute, stroke suspected. EXAM: MRA HEAD WITHOUT CONTRAST TECHNIQUE: Angiographic images of the Circle of Willis were acquired using MRA technique without intravenous contrast. COMPARISON:  No pertinent prior exam. FINDINGS: POSTERIOR CIRCULATION: --Vertebral arteries: Normal --Inferior cerebellar arteries: Normal. --Basilar artery: Normal. --Superior cerebellar arteries: Normal. --Posterior cerebral arteries: Normal. ANTERIOR CIRCULATION: --Intracranial internal carotid arteries: Normal. --Anterior cerebral arteries (ACA): Normal. --Middle  cerebral arteries (MCA): Normal. ANATOMIC VARIANTS: None IMPRESSION: Normal intracranial MRA. Electronically Signed   By: Deatra Robinson M.D.   On: 07/27/2021 21:55   MR BRAIN WO CONTRAST  Result Date: 07/27/2021 CLINICAL DATA:  Neuro deficit, acute, stroke suspected EXAM: MRI HEAD WITHOUT CONTRAST TECHNIQUE: Multiplanar, multiecho pulse sequences of the brain and surrounding structures were obtained without intravenous contrast. COMPARISON:  Same-day CT head.  MRI February 27, 2016. FINDINGS: Motion limited study.  Within this limitation: Brain: Multiple small areas mild DWI hyperintensity in the high right frontal lobe (series 2, images 34-37). At least one of these lesions appears to have correlate ADC hypointensity (series 250, image 36) and therefore likely represents a recent infarct. The other areas are either hyperintense or isointense on ADC and may represent subacute infarcts or artifactual T2 shine through. Mild T2 hyperintensity in these areas without mass effect. Additional mild for age scattered T2 hyperintensities in the supratentorial and pontine white matter, nonspecific but compatible with chronic microvascular ischemic disease. Small remote right cerebellar lacunar infarct. Mild for age atrophy. Vascular: Major arterial flow voids are maintained skull base. Skull and upper cervical spine: Normal marrow signal. Partially imaged upper cervical degenerative change with at least mild-to-moderate canal stenosis. Sinuses/Orbits: Mild paranasal sinus mucosal thickening. Left maxillary sinus retention cyst. No acute orbital findings. Other: No sizable mastoid effusions. IMPRESSION: Motion limited study. 1. Multiple small areas of mild DWI hyperintensity in the high right frontal lobe. At least one of these lesions appears to have correlate ADC hypointensity, suggestive of a recent infarct. The other areas are either hyperintense or isointense on ADC and may represent subacute infarcts and/or artifactual T2  shine through. 2. Mild for age chronic microvascular ischemic disease and atrophy. 3. Upper cervical spine degenerative change with at least mild-to-moderate canal stenosis. An MRI of the cervical spine could further evaluate if clinically indicated. Electronically Signed   By: Feliberto Harts M.D.   On: 07/27/2021 18:00   MR CERVICAL SPINE WO CONTRAST  Result Date: 07/28/2021 CLINICAL DATA:  Cervical radiculopathy EXAM: MRI CERVICAL SPINE WITHOUT CONTRAST TECHNIQUE: Multiplanar, multisequence MR imaging of the cervical spine was performed. No intravenous contrast was administered. COMPARISON:  None. FINDINGS: Alignment: There is 3 mm anterolisthesis of C2 on  C3 and C3 on C4, likely degenerative in nature. There is reversal of the normal cervical spine lordosis with focal kyphosis centered at C4-C5. Vertebrae: Vertebral body heights are preserved, without evidence of acute fracture. There is mild reactive marrow signal abnormality in the left posterior elements at C2 and C3. Marrow signal is otherwise normal. There is a prominent Schmorl's node indenting the superior C5 endplate. Cord: Normal signal and morphology. Posterior Fossa, vertebral arteries, paraspinal tissues: The paraspinal soft tissues are unremarkable. The vertebral artery flow voids are present. Disc levels: There is marked disc space narrowing at C4-C5 and C6-C7. There is mild-to-moderate disc desiccation and narrowing at the remaining levels. Craniocervical junction: There is prominent degenerative pannus about the dens resulting in mild narrowing of the craniocervical junction without mass effect on the underlying cord/cervicomedullary junction. C2-C3: There is a broad-based posterior disc osteophyte complex, ligamentum flavum thickening, and uncovertebral and facet arthropathy resulting in moderate to severe spinal canal stenosis with effacement of the thecal sac and mild mass effect on the cord, and moderate bilateral neural foraminal  stenosis. There are small bilateral facet joint effusions. C3-C4: There is a mild posterior disc osteophyte complex, ligamentum flavum thickening, and uncovertebral and facet arthropathy resulting in mild spinal canal stenosis with effacement of the ventral thecal sac and severe bilateral neural foraminal stenosis. C4-C5: There is a posterior disc osteophyte complex with prominent uncovertebral spurring, ligamentum flavum thickening, and bilateral facet arthropathy resulting in mild-to-moderate spinal canal stenosis and severe bilateral neural foraminal stenosis. C5-C6: There is a posterior disc osteophyte complex with a broad-based left paracentral protrusion, ligamentum flavum thickening, and uncovertebral and facet arthropathy resulting in moderate spinal canal stenosis with effacement of the thecal sac and severe bilateral neural foraminal stenosis C6-C7: There is a posterior disc osteophyte complex with a broad-based left paracentral protrusion, ligamentum flavum thickening, and uncovertebral and facet arthropathy resulting in moderate spinal canal stenosis with effacement of the thecal sac and severe left worse than right neural foraminal stenosis C7-T1: There is degenerative endplate change and facet arthropathy resulting in moderate left and mild right neural foraminal stenosis without significant spinal canal stenosis. T1-T2: There is a mild disc bulge, degenerative endplate change, and bilateral facet arthropathy resulting in moderate left and mild right neural foraminal stenosis without significant spinal canal stenosis. IMPRESSION: 1. Grade 1 anterolisthesis of C2 on C3 and C3 on C4, likely degenerative in nature. 2. Moderate to severe spinal canal stenosis C2-C3 with mild mass effect on the cord but no evidence of cord signal abnormality. There is also moderate spinal canal stenosis at C5-C6 and C6-C7 without cord compression. 3. Additional advanced multilevel degenerative changes with severe neural  foraminal stenosis at numerous levels described in detail above. 4. Mild reactive marrow signal abnormality in the posterior elements on the left C2-C3. Electronically Signed   By: Lesia Hausen M.D.   On: 07/28/2021 10:28   ECHOCARDIOGRAM COMPLETE  Result Date: 07/28/2021    ECHOCARDIOGRAM REPORT   Patient Name:   ZEMIRAH KRASINSKI Date of Exam: 07/28/2021 Medical Rec #:  [id number]        Height:       62.0 in Accession #:    [id number]       Weight:       166.7 lb Date of Birth:  1930-10-29        BSA:          1.769 m Patient Age:    59 years  BP:           147/59 mmHg Patient Gender: F                HR:           71 bpm. Exam Location:  Inpatient Procedure: 2D Echo, Cardiac Doppler and Color Doppler Indications:    Stroke I63.9  History:        Patient has prior history of Echocardiogram examinations, most                 recent 04/11/2019. TIA; Risk Factors:Hypertension and                 Dyslipidemia. GERD.  Sonographer:    Leta Jungling RDCS Referring Phys: [id number] ALLISON WOLFE IMPRESSIONS  1. Left ventricular ejection fraction, by estimation, is 60 to 65%. The left ventricle has normal function. The left ventricle has no regional wall motion abnormalities. Left ventricular diastolic parameters are consistent with Grade I diastolic dysfunction (impaired relaxation).  2. Right ventricular systolic function is normal. The right ventricular size is normal. There is normal pulmonary artery systolic pressure.  3. The mitral valve is normal in structure. Mild mitral valve regurgitation. No evidence of mitral stenosis. Moderate mitral annular calcification.  4. The aortic valve is tricuspid. Aortic valve regurgitation is not visualized. Mild aortic valve sclerosis is present, with no evidence of aortic valve stenosis.  5. The inferior vena cava is normal in size with greater than 50% respiratory variability, suggesting right atrial pressure of 3 mmHg. Comparison(s): No significant change from prior study.  Prior images reviewed side by side. Conclusion(s)/Recommendation(s): No intracardiac source of embolism detected on this transthoracic study. A transesophageal echocardiogram is recommended to exclude cardiac source of embolism if clinically indicated. FINDINGS  Left Ventricle: Left ventricular ejection fraction, by estimation, is 60 to 65%. The left ventricle has normal function. The left ventricle has no regional wall motion abnormalities. The left ventricular internal cavity size was normal in size. There is  no left ventricular hypertrophy. Left ventricular diastolic parameters are consistent with Grade I diastolic dysfunction (impaired relaxation). Right Ventricle: The right ventricular size is normal. No increase in right ventricular wall thickness. Right ventricular systolic function is normal. There is normal pulmonary artery systolic pressure. The tricuspid regurgitant velocity is 2.78 m/s, and  with an assumed right atrial pressure of 3 mmHg, the estimated right ventricular systolic pressure is 33.9 mmHg. Left Atrium: Left atrial size was normal in size. Right Atrium: Right atrial size was normal in size. Pericardium: There is no evidence of pericardial effusion. Mitral Valve: The mitral valve is normal in structure. Moderate mitral annular calcification. Mild mitral valve regurgitation. No evidence of mitral valve stenosis. Tricuspid Valve: The tricuspid valve is normal in structure. Tricuspid valve regurgitation is mild . No evidence of tricuspid stenosis. Aortic Valve: The aortic valve is tricuspid. Aortic valve regurgitation is not visualized. Mild aortic valve sclerosis is present, with no evidence of aortic valve stenosis. Pulmonic Valve: The pulmonic valve was normal in structure. Pulmonic valve regurgitation is not visualized. No evidence of pulmonic stenosis. Aorta: The aortic root is normal in size and structure. Venous: The inferior vena cava is normal in size with greater than 50% respiratory  variability, suggesting right atrial pressure of 3 mmHg. IAS/Shunts: No atrial level shunt detected by color flow Doppler.  LEFT VENTRICLE PLAX 2D LVIDd:         4.60 cm  Diastology LVIDs:  3.30 cm  LV e' medial:    4.50 cm/s LV PW:         0.80 cm  LV E/e' medial:  19.3 LV IVS:        0.80 cm  LV e' lateral:   4.62 cm/s LVOT diam:     1.80 cm  LV E/e' lateral: 18.9 LV SV:         53 LV SV Index:   30 LVOT Area:     2.54 cm  RIGHT VENTRICLE RV S prime:     9.58 cm/s LEFT ATRIUM             Index LA diam:        4.00 cm 2.26 cm/m LA Vol (A2C):   26.3 ml 14.87 ml/m LA Vol (A4C):   22.9 ml 12.94 ml/m LA Biplane Vol: 25.2 ml 14.24 ml/m  AORTIC VALVE LVOT Vmax:   93.80 cm/s LVOT Vmean:  55.000 cm/s LVOT VTI:    0.210 m  AORTA Ao Root diam: 2.80 cm Ao Asc diam:  2.90 cm MITRAL VALVE                TRICUSPID VALVE MV Area (PHT): 3.39 cm     TR Peak grad:   30.9 mmHg MV Decel Time: 224 msec     TR Vmax:        278.00 cm/s MV E velocity: 87.10 cm/s MV A velocity: 110.50 cm/s  SHUNTS MV E/A ratio:  0.79         Systemic VTI:  0.21 m                             Systemic Diam: 1.80 cm Donato Schultz MD Electronically signed by Donato Schultz MD Signature Date/Time: 07/28/2021/1:42:02 PM    Final    CT HEAD CODE STROKE WO CONTRAST  Result Date: 07/27/2021 CLINICAL DATA:  Code stroke. Acute neuro deficit. Rule out stroke. Left-sided weakness EXAM: CT HEAD WITHOUT CONTRAST TECHNIQUE: Contiguous axial images were obtained from the base of the skull through the vertex without intravenous contrast. COMPARISON:  CT head 07/23/2021 FINDINGS: Brain: No evidence of acute infarction, hemorrhage, hydrocephalus, extra-axial collection or mass lesion/mass effect. Mild white matter changes most likely due to chronic microvascular ischemic change. Vascular: Negative for hyperdense vessel Skull: Negative Sinuses/Orbits: Extensive mucosal edema right sphenoid sinus unchanged. Mild mucosal edema maxillary sinus bilaterally. Bilateral  cataract extraction. No orbital mass. Other: None ASPECTS ([place] Stroke Program Early CT Score) - Ganglionic level infarction (caudate, lentiform nuclei, internal capsule, insula, M1-M3 cortex): 7 - Supraganglionic infarction (M4-M6 cortex): 3 Total score (0-10 with 10 being normal): 10 IMPRESSION: 1. No acute intracranial abnormality 2. ASPECTS is 10 3. Code stroke imaging results were communicated on 07/27/2021 at 3:45 pm to provider Pearlean Brownie via text page Electronically Signed   By: Marlan Palau M.D.   On: 07/27/2021 15:46   VAS US CAROTID  Result Date: 07/29/2021 Carotid Arterial Duplex Study Patient Name:  KENEDIE DIROCCO  Date of Exam:   07/29/2021 Medical Rec #: [id number]         Accession #:    [id number] Date of Birth: 1929/12/13         Patient Gender: F Patient Age:   50 years Exam Location:  [facility] Procedure:      VAS US CAROTID Referring Phys: Terrilee Files [facility] --------------------------------------------------------------------------------  Indications:       CVA.  Risk Factors:      Hypertension, hyperlipidemia. Comparison Study:  02/28/16 prior Performing Technologist: [place] Ponder RVS  Examination Guidelines: A complete evaluation includes B-mode imaging, spectral Doppler, color Doppler, and power Doppler as needed of all accessible portions of each vessel. Bilateral testing is considered an integral part of a complete examination. Limited examinations for reoccurring indications may be performed as noted.  Right Carotid Findings: +----------+--------+--------+--------+------------------+--------+           PSV cm/sEDV cm/sStenosisPlaque DescriptionComments +----------+--------+--------+--------+------------------+--------+ CCA Prox  71      16              heterogenous               +----------+--------+--------+--------+------------------+--------+ CCA Distal63      15              heterogenous                +----------+--------+--------+--------+------------------+--------+ ICA Prox  61      8       1-39%   heterogenous               +----------+--------+--------+--------+------------------+--------+ ICA Distal59      19                                         +----------+--------+--------+--------+------------------+--------+ ECA       92      11                                         +----------+--------+--------+--------+------------------+--------+ +----------+--------+-------+--------+-------------------+           PSV cm/sEDV cmsDescribeArm Pressure (mmHG) +----------+--------+-------+--------+-------------------+ [id number]                                        +----------+--------+-------+--------+-------------------+ +---------+--------+--------+--------------+ VertebralPSV cm/sEDV cm/sNot identified +---------+--------+--------+--------------+  Left Carotid Findings: +----------+--------+--------+--------+------------------+--------+           PSV cm/sEDV cm/sStenosisPlaque DescriptionComments +----------+--------+--------+--------+------------------+--------+ CCA Prox  82                      heterogenous               +----------+--------+--------+--------+------------------+--------+ CCA Distal64      11              heterogenous               +----------+--------+--------+--------+------------------+--------+ ICA Prox  115     17      1-39%   heterogenous               +----------+--------+--------+--------+------------------+--------+ ICA Distal57      18                                         +----------+--------+--------+--------+------------------+--------+ ECA       124                                                +----------+--------+--------+--------+------------------+--------+ +----------+--------+--------+--------+-------------------+  PSV cm/sEDV cm/sDescribeArm Pressure (mmHG)  +----------+--------+--------+--------+-------------------+ [id number]                                          +----------+--------+--------+--------+-------------------+ +---------+--------+--------+--------------+ VertebralPSV cm/sEDV cm/sNot identified +---------+--------+--------+--------------+   Summary: Right Carotid: Velocities in the right ICA are consistent with a 1-39% stenosis. Left Carotid: Velocities in the left ICA are consistent with a 1-39% stenosis. Vertebrals: Bilateral vertebral arteries were not visualized. *See table(s) above for measurements and observations.     Preliminary      PERTINENT LAB RESULTS: CBC: Recent Labs    07/27/21 1530 07/27/21 1535  WBC 7.6  --   HGB 10.0* 10.2*  HCT 32.9* 30.0*  PLT 264  --    CMET CMP     Component Value Date/Time   NA 134 (L) 07/27/2021 1535   K 3.8 07/27/2021 1535   CL 102 07/27/2021 1535   CO2 23 07/27/2021 1530   GLUCOSE 103 (H) 07/27/2021 1535   BUN 31 (H) 07/27/2021 1535   CREATININE 0.90 07/27/2021 1535   CALCIUM 8.2 (L) 07/27/2021 1530   PROT 6.1 (L) 07/27/2021 1530   ALBUMIN 2.9 (L) 07/27/2021 1530   AST 15 07/27/2021 1530   ALT 12 07/27/2021 1530   ALKPHOS 57 07/27/2021 1530   BILITOT 0.6 07/27/2021 1530   GFRNONAA >60 07/27/2021 1530   GFRAA >60 04/12/2019 0543    GFR Estimated Creatinine Clearance: 40.3 mL/min (by C-G formula based on SCr of 0.9 mg/dL). No results for input(s): LIPASE, AMYLASE in the last 72 hours. No results for input(s): CKTOTAL, CKMB, CKMBINDEX, TROPONINI in the last 72 hours. Invalid input(s): POCBNP No results for input(s): DDIMER in the last 72 hours. Recent Labs    07/27/21 1715  HGBA1C 6.5*   Recent Labs    07/27/21 1859  CHOL 148  HDL 45  LDLCALC 62  TRIG 205*  CHOLHDL 3.3   No results for input(s): TSH, T4TOTAL, T3FREE, THYROIDAB in the last 72 hours.  Invalid input(s): FREET3 Recent Labs    07/28/21 0110  FERRITIN 24  TIBC 340  IRON 40    Coags: Recent Labs    07/27/21 1530  INR 1.0   Microbiology: Recent Results (from the past 240 hour(s))  Resp Panel by RT-PCR (Flu A&B, Covid) Nasopharyngeal Swab     Status: None   Collection Time: 07/23/21  2:41 AM   Specimen: Nasopharyngeal Swab; Nasopharyngeal(NP) swabs in vial transport medium  Result Value Ref Range Status   SARS Coronavirus 2 by RT PCR NEGATIVE NEGATIVE Final    Comment: (NOTE) SARS-CoV-2 target nucleic acids are NOT DETECTED.  The SARS-CoV-2 RNA is generally detectable in upper respiratory specimens during the acute phase of infection. The lowest concentration of SARS-CoV-2 viral copies this assay can detect is 138 copies/mL. A negative result does not preclude SARS-Cov-2 infection and should not be used as the sole basis for treatment or other patient management decisions. A negative result may occur with  improper specimen collection/handling, submission of specimen other than nasopharyngeal swab, presence of viral mutation(s) within the areas targeted by this assay, and inadequate number of viral copies(<138 copies/mL). A negative result must be combined with clinical observations, patient history, and epidemiological information. The expected result is Negative.  Fact Sheet for Patients:  BloggerCourse.com  Fact Sheet for Healthcare Providers:  SeriousBroker.it  This test is no t yet approved or  cleared by the [place] and  has been authorized for detection and/or diagnosis of SARS-CoV-2 by FDA under an Emergency Use Authorization (EUA). This EUA will remain  in effect (meaning this test can be used) for the duration of the COVID-19 declaration under Section 564(b)(1) of the Act, 21 U.S.C.section 360bbb-3(b)(1), unless the authorization is terminated  or revoked sooner.       Influenza A by PCR NEGATIVE NEGATIVE Final   Influenza B by PCR NEGATIVE NEGATIVE Final    Comment:  (NOTE) The Xpert Xpress SARS-CoV-2/FLU/RSV plus assay is intended as an aid in the diagnosis of influenza from Nasopharyngeal swab specimens and should not be used as a sole basis for treatment. Nasal washings and aspirates are unacceptable for Xpert Xpress SARS-CoV-2/FLU/RSV testing.  Fact Sheet for Patients: BloggerCourse.com  Fact Sheet for Healthcare Providers: SeriousBroker.it  This test is not yet approved or cleared by the [place] FDA and has been authorized for detection and/or diagnosis of SARS-CoV-2 by FDA under an Emergency Use Authorization (EUA). This EUA will remain in effect (meaning this test can be used) for the duration of the COVID-19 declaration under Section 564(b)(1) of the Act, 21 U.S.C. section 360bbb-3(b)(1), unless the authorization is terminated or revoked.  Performed at [facility], [address] [place], [place] [address]   Urine Culture     Status: Abnormal   Collection Time: 07/27/21  5:17 PM   Specimen: Urine, Clean Catch  Result Value Ref Range Status   Specimen Description URINE, CLEAN CATCH  Final   Special Requests NONE  Final   Culture (A)  Final    <10,000 COLONIES/mL INSIGNIFICANT GROWTH Performed at [facility], [address] [place], [place] [address]    Report Status 07/29/2021 FINAL  Final  Resp Panel by RT-PCR (Flu A&B, Covid) Nasopharyngeal Swab     Status: None   Collection Time: 07/27/21  6:59 PM   Specimen: Nasopharyngeal Swab; Nasopharyngeal(NP) swabs in vial transport medium  Result Value Ref Range Status   SARS Coronavirus 2 by RT PCR NEGATIVE NEGATIVE Final    Comment: (NOTE) SARS-CoV-2 target nucleic acids are NOT DETECTED.  The SARS-CoV-2 RNA is generally detectable in upper respiratory specimens during the acute phase of infection. The lowest concentration of SARS-CoV-2 viral copies this assay can detect is 138 copies/mL. A negative result does  not preclude SARS-Cov-2 infection and should not be used as the sole basis for treatment or other patient management decisions. A negative result may occur with  improper specimen collection/handling, submission of specimen other than nasopharyngeal swab, presence of viral mutation(s) within the areas targeted by this assay, and inadequate number of viral copies(<138 copies/mL). A negative result must be combined with clinical observations, patient history, and epidemiological information. The expected result is Negative.  Fact Sheet for Patients:  BloggerCourse.com  Fact Sheet for Healthcare Providers:  SeriousBroker.it  This test is no t yet approved or cleared by the [place] FDA and  has been authorized for detection and/or diagnosis of SARS-CoV-2 by FDA under an Emergency Use Authorization (EUA). This EUA will remain  in effect (meaning this test can be used) for the duration of the COVID-19 declaration under Section 564(b)(1) of the Act, 21 U.S.C.section 360bbb-3(b)(1), unless the authorization is terminated  or revoked sooner.       Influenza A by PCR NEGATIVE NEGATIVE Final   Influenza B by PCR NEGATIVE NEGATIVE Final    Comment: (NOTE) The Xpert Xpress  SARS-CoV-2/FLU/RSV plus assay is intended as an aid in the diagnosis of influenza from Nasopharyngeal swab specimens and should not be used as a sole basis for treatment. Nasal washings and aspirates are unacceptable for Xpert Xpress SARS-CoV-2/FLU/RSV testing.  Fact Sheet for Patients: BloggerCourse.com  Fact Sheet for Healthcare Providers: SeriousBroker.it  This test is not yet approved or cleared by the [place] FDA and has been authorized for detection and/or diagnosis of SARS-CoV-2 by FDA under an Emergency Use Authorization (EUA). This EUA will remain in effect (meaning this test can be used) for the  duration of the COVID-19 declaration under Section 564(b)(1) of the Act, 21 U.S.C. section 360bbb-3(b)(1), unless the authorization is terminated or revoked.  Performed at [facility], [address] [place], [place] [address]     FURTHER DISCHARGE INSTRUCTIONS:  Get Medicines reviewed and adjusted: Please take all your medications with you for your next visit with your Primary MD  Laboratory/radiological data: Please request your Primary MD to go over all hospital tests and procedure/radiological results at the follow up, please ask your Primary MD to get all Hospital records sent to his/her office.  In some cases, they will be blood work, cultures and biopsy results pending at the time of your discharge. Please request that your primary care M.D. goes through all the records of your hospital data and follows up on these results.  Also Note the following: If you experience worsening of your admission symptoms, develop shortness of breath, life threatening emergency, suicidal or homicidal thoughts you must seek medical attention immediately by calling 911 or calling your MD immediately  if symptoms less severe.  You must read complete instructions/literature along with all the possible adverse reactions/side effects for all the Medicines you take and that have been prescribed to you. Take any new Medicines after you have completely understood and accpet all the possible adverse reactions/side effects.   Do not drive when taking Pain medications or sleeping medications (Benzodaizepines)  Do not take more than prescribed Pain, Sleep and Anxiety Medications. It is not advisable to combine anxiety,sleep and pain medications without talking with your primary care practitioner  Special Instructions: If you have smoked or chewed Tobacco  in the last 2 yrs please stop smoking, stop any regular Alcohol  and or any Recreational drug use.  Wear Seat belts while driving.  Please  note: You were cared for by a hospitalist during your hospital stay. Once you are discharged, your primary care physician will handle any further medical issues. Please note that NO REFILLS for any discharge medications will be authorized once you are discharged, as it is imperative that you return to your primary care physician (or establish a relationship with a primary care physician if you do not have one) for your post hospital discharge needs so that they can reassess your need for medications and monitor your lab values.  Total Time spent coordinating discharge including counseling, education and face to face time equals 35 minutes.  SignedJeoffrey Massed 07/29/2021 10:49 AM

## 2021-07-29 NOTE — Progress Notes (Addendum)
STROKE TEAM PROGRESS NOTE   INTERVAL HISTORY Patient is seen in her room; no family present in the room at this time. She is hard of hearing but is pleasant and cooperative. She complains of generalized arthritic pain, specifically at both lower extremities. Neurological exam is otherwise unchanged.  Vital signs are stable.   Echocardiogram shows normal ejection fraction without cardiac source of embolism.  Carotid ultrasound shows no significant bilateral extracranial carotid stenosis.  Vitals:   07/28/21 2100 07/29/21 0003 07/29/21 0409 07/29/21 0811  BP:  (!) 150/67 (!) 153/73 140/63  Pulse:   74 83  Resp:   19 18  Temp: 98.1 F (36.7 C) 98.2 F (36.8 C) 98.3 F (36.8 C) 99.2 F (37.3 C)  TempSrc: Oral Oral Oral Oral  SpO2:   93% 96%  Weight:      Height:       CBC:  Recent Labs  Lab 07/23/21 0241 07/27/21 1530 07/27/21 1535  WBC 8.6 7.6  --   NEUTROABS 5.5 5.2  --   HGB 11.5* 10.0* 10.2*  HCT 36.6 32.9* 30.0*  MCV 96.8 100.6*  --   PLT 270 264  --     Basic Metabolic Panel:  Recent Labs  Lab 07/23/21 0241 07/27/21 1530 07/27/21 1535  NA 129* 133* 134*  K 4.1 3.9 3.8  CL 91* 103 102  CO2 27 23  --   GLUCOSE 111* 108* 103*  BUN 25* 33* 31*  CREATININE 0.82 0.82 0.90  CALCIUM 10.3 8.2*  --      Lipid Panel:  Recent Labs  Lab 07/27/21 1859  CHOL 148  TRIG 205*  HDL 45  CHOLHDL 3.3  VLDL 41*  LDLCALC 62     HgbA1c:  Recent Labs  Lab 07/27/21 1715  HGBA1C 6.5*    Urine Drug Screen:  Recent Labs  Lab 07/23/21 0454  LABOPIA NONE DETECTED  COCAINSCRNUR NONE DETECTED  LABBENZ NONE DETECTED  AMPHETMU NONE DETECTED  THCU NONE DETECTED  LABBARB NONE DETECTED     Alcohol Level  Recent Labs  Lab 07/23/21 0241  ETH <10     IMAGING past 24 hours VAS US CAROTID  Result Date: 07/29/2021 Carotid Arterial Duplex Study Patient Name:  Jill Hernandez  Date of Exam:   07/29/2021 Medical Rec #: [id number]         Accession #:    [id number] Date of  Birth: 01/28/1930         Patient Gender: F Patient Age:   87 years Exam Location:  [facility] Procedure:      VAS US CAROTID Referring Phys: Terrilee Files [facility] --------------------------------------------------------------------------------  Indications:       CVA. Risk Factors:      Hypertension, hyperlipidemia. Comparison Study:  02/28/16 prior Performing Technologist: Argentina Ponder RVS  Examination Guidelines: A complete evaluation includes B-mode imaging, spectral Doppler, color Doppler, and power Doppler as needed of all accessible portions of each vessel. Bilateral testing is considered an integral part of a complete examination. Limited examinations for reoccurring indications may be performed as noted.  Right Carotid Findings: +----------+--------+--------+--------+------------------+--------+           PSV cm/sEDV cm/sStenosisPlaque DescriptionComments +----------+--------+--------+--------+------------------+--------+ CCA Prox  71      16              heterogenous               +----------+--------+--------+--------+------------------+--------+ CCA Distal63      15  heterogenous               +----------+--------+--------+--------+------------------+--------+ ICA Prox  61      8       1-39%   heterogenous               +----------+--------+--------+--------+------------------+--------+ ICA Distal59      19                                         +----------+--------+--------+--------+------------------+--------+ ECA       92      11                                         +----------+--------+--------+--------+------------------+--------+ +----------+--------+-------+--------+-------------------+           PSV cm/sEDV cmsDescribeArm Pressure (mmHG) +----------+--------+-------+--------+-------------------+ [id number]                                        +----------+--------+-------+--------+-------------------+  +---------+--------+--------+--------------+ VertebralPSV cm/sEDV cm/sNot identified +---------+--------+--------+--------------+  Left Carotid Findings: +----------+--------+--------+--------+------------------+--------+           PSV cm/sEDV cm/sStenosisPlaque DescriptionComments +----------+--------+--------+--------+------------------+--------+ CCA Prox  82                      heterogenous               +----------+--------+--------+--------+------------------+--------+ CCA Distal64      11              heterogenous               +----------+--------+--------+--------+------------------+--------+ ICA Prox  115     17      1-39%   heterogenous               +----------+--------+--------+--------+------------------+--------+ ICA Distal57      18                                         +----------+--------+--------+--------+------------------+--------+ ECA       124                                                +----------+--------+--------+--------+------------------+--------+ +----------+--------+--------+--------+-------------------+           PSV cm/sEDV cm/sDescribeArm Pressure (mmHG) +----------+--------+--------+--------+-------------------+ [id number]                                          +----------+--------+--------+--------+-------------------+ +---------+--------+--------+--------------+ VertebralPSV cm/sEDV cm/sNot identified +---------+--------+--------+--------------+   Summary: Right Carotid: Velocities in the right ICA are consistent with a 1-39% stenosis. Left Carotid: Velocities in the left ICA are consistent with a 1-39% stenosis. Vertebrals: Bilateral vertebral arteries were not visualized. *See table(s) above for measurements and observations.     Preliminary     PHYSICAL EXAM Elderly Caucasian lady not in distress. . Afebrile. Head is nontraumatic. Neck is supple without bruit.    Cardiac exam no murmur or  gallop. Lungs  are clear to auscultation. Distal pulses are well felt.  Neurological Exam ;  Awake  Alert oriented x 3. Normal speech and language.eye movements full without nystagmus.fundi were not visualized. Vision acuity and fields appear normal. Hearing is normal. Palatal movements are normal. Face symmetric. Tongue midline. Normal strength, tone, reflexes and coordination.  Except lower extremity movements limited due to arthritic pain.  Normal sensation.  Except left upper extremity subjective numbness.  Gait deferred.  ASSESSMENT/PLAN Ms. Jill Hernandez is a 87 y.o. female with history of hypertension, hyperlipidemia and multiple TIAs and gastroesophageal flux disease.  She lives in independent living facility and states that she had sudden onset of left upper extremity numbness and EMS was called and called a code stroke.  The patient in fact states that she been having symptoms off and on for the last 2 to 4 days.  Her daughter who arrived later corroborated her story that she has been complaining of dizziness, bilateral blurred vision as well as some numbness in the left side off and on.  She was in fact evaluated in the ER for the symptoms on 07/23/2021 and had noncontrast CT scan of the head which showed age-related atrophy and no acute abnormalities.  CT angiogram was also obtained which showed no large vessel occlusion and showed moderate stenosis of the right ICA siphon due to calcified plaque and mild right P2 atherosclerosis.  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  The patient was seen by me upon arrival and complained of left upper extremity numbness and heaviness but her NIH stroke scale was positive only for this and she had no other deficits.  Emergent CT scan of the head was obtained which showed aspect score of 10 with no acute abnormality.   IV thrombolysis considered no deficits to mild and symptoms ongoing for several days. MRI brain shows multiple small areas of  mild DWI hyperintensity in right frontal lobe, acute to subacute appearing. MRI cervical spine showed posterior disc osteophyte complex with mild spinal canal stenosis and severe bilateral neural foraminal stenosis seen at C4-C5, as well as additional advanced multilevel degenerative changes at numerous levels, explaining the left arm numbness.   Plan to continue with aspirin 81mg  and add plavix 75mg  x 3 weeks then continue with aspirin alone.  Follow up with Dr. as outpatient in 2 months.  We will sign off at this time, but remain available to assist.   Stroke :  Right frontal lobe subacute infarcts likely secondary to  small vessel disease .  Subjective left upper extremity numbness likely from compressive cervical myelopathy from degenerative cervical spine disease. Code Stroke  CT head No acute abnormality.  Small vessel disease. Atrophy.  ASPECTS 10.     MRI  BRAIN: Multiple small areas of mild DWI hyperintensity in the high right frontal lobe. At least one of these lesions appears to have correlate ADC hypointensity, suggestive of a recent infarct. The other areas are either hyperintense or isointense on ADC and may represent subacute infarcts and/or artifactual T2 shine through. 2. Mild for age chronic microvascular ischemic disease and atrophy. 3. Upper cervical spine degenerative change with at least mild-to-moderate canal stenosis  MRA  normal intracranial MrRA Carotid Doppler: right carotid: 1-39% stenosis, Left carotid: 1-39% stenosis  2D Echo ejection fraction 60 to 65%.  No cardiac source of embolism. LDL 62 HgbA1c 6.5 VTE prophylaxis - scd    Diet   DIET DYS 3  Room service appropriate? Yes; Fluid consistency: Thin   aspirin 81 mg daily prior to admission, now on aspirin 81 mg daily and clopidogrel 75 mg daily.   Therapy recommendations:  none yet Disposition:  snf-vs-home  Hypertension Home meds:  amlodipine Stable Permissive hypertension (OK if < 220/120) but  gradually normalize in 5-7 days Long-term BP goal normotensive  Hyperlipidemia Home meds:  lovastatin 20mg , pravastatin started in hospital LDL 62, goal <   Continue statin at discharge  HgbA1c 6.5, goal < 7.0 CBGs Recent Labs    07/27/21 1530  GLUCAP 98     SSI  Other Stroke Risk Factors  Advanced Age >/= 82  Obesity, Body mass index is 30.48 kg/m., BMI >/= 30 associated with increased stroke risk, recommend weight loss, diet and exercise as appropriate    Other Active Problems  Anemia Around her baseline hemoglobin 9.7-11.5 -no recent  iron studies so these are pending    Knee osteoarthritis -Uses Tylenol as needed and I have ordered this -Uses Biofreeze -Do Voltaren gel as needed while here    GERD (gastroesophageal reflux disease) -Continue Prilosec daily    Continue aspirin and Plavix for 3 weeks followed by Plavix alone.  Continue aggressive risk factor modification.  Stroke team will sign off.  Kindly call for questions.  Follow-up as an outpatient stroke clinic in 2 months discussed with Dr. 04-12-1977.Greater than 50% time during this 25-minute visit was spent on counseling and coordination of care and discussion with care team. Jerral Ralph, MD    To contact Stroke Continuity provider, please refer to Delia Heady. After hours, contact General Neurology

## 2021-07-29 NOTE — Progress Notes (Signed)
Called to give report to [facility], placed on hold. Called back and report given to [facility]

## 2021-07-29 NOTE — Plan of Care (Signed)
  Problem: Education: Goal: Knowledge of General Education information will improve Description: Including pain rating scale, medication(s)/side effects and non-pharmacologic comfort measures Outcome: Progressing   Problem: Health Behavior/Discharge Planning: Goal: Ability to manage health-related needs will improve Outcome: Progressing   Problem: Clinical Measurements: Goal: Ability to maintain clinical measurements within normal limits will improve Outcome: Progressing Goal: Will remain free from infection Outcome: Progressing   Problem: Activity: Goal: Risk for activity intolerance will decrease Outcome: Progressing   Problem: Nutrition: Goal: Adequate nutrition will be maintained Outcome: Progressing   Problem: Coping: Goal: Will verbalize positive feelings about self Outcome: Progressing   Problem: Health Behavior/Discharge Planning: Goal: Ability to manage health-related needs will improve Outcome: Progressing   Problem: Self-Care: Goal: Ability to participate in self-care as condition permits will improve Outcome: Progressing

## 2021-07-29 NOTE — Progress Notes (Signed)
Carotid duplex has been completed.   Preliminary results in CV Proc.   Erna Brossard Areyana Leoni 07/29/2021 10:15 AM

## 2021-07-30 DIAGNOSIS — H43813 Vitreous degeneration, bilateral: Secondary | ICD-10-CM | POA: Diagnosis not present

## 2021-07-30 DIAGNOSIS — I10 Essential (primary) hypertension: Secondary | ICD-10-CM | POA: Diagnosis not present

## 2021-07-30 DIAGNOSIS — Z20818 Contact with and (suspected) exposure to other bacterial communicable diseases: Secondary | ICD-10-CM | POA: Diagnosis not present

## 2021-07-30 DIAGNOSIS — R2681 Unsteadiness on feet: Secondary | ICD-10-CM | POA: Diagnosis not present

## 2021-07-30 DIAGNOSIS — K5901 Slow transit constipation: Secondary | ICD-10-CM | POA: Diagnosis not present

## 2021-07-30 DIAGNOSIS — J069 Acute upper respiratory infection, unspecified: Secondary | ICD-10-CM | POA: Diagnosis not present

## 2021-07-30 DIAGNOSIS — R41841 Cognitive communication deficit: Secondary | ICD-10-CM | POA: Diagnosis not present

## 2021-07-30 DIAGNOSIS — I69391 Dysphagia following cerebral infarction: Secondary | ICD-10-CM | POA: Diagnosis not present

## 2021-07-30 DIAGNOSIS — E785 Hyperlipidemia, unspecified: Secondary | ICD-10-CM | POA: Diagnosis not present

## 2021-07-30 DIAGNOSIS — I951 Orthostatic hypotension: Secondary | ICD-10-CM | POA: Diagnosis not present

## 2021-07-30 DIAGNOSIS — M4802 Spinal stenosis, cervical region: Secondary | ICD-10-CM | POA: Diagnosis not present

## 2021-07-30 DIAGNOSIS — E1151 Type 2 diabetes mellitus with diabetic peripheral angiopathy without gangrene: Secondary | ICD-10-CM | POA: Diagnosis not present

## 2021-07-30 DIAGNOSIS — I63541 Cerebral infarction due to unspecified occlusion or stenosis of right cerebellar artery: Secondary | ICD-10-CM | POA: Diagnosis not present

## 2021-07-30 DIAGNOSIS — M6281 Muscle weakness (generalized): Secondary | ICD-10-CM | POA: Diagnosis not present

## 2021-07-30 DIAGNOSIS — M2550 Pain in unspecified joint: Secondary | ICD-10-CM | POA: Diagnosis not present

## 2021-07-30 DIAGNOSIS — I63231 Cerebral infarction due to unspecified occlusion or stenosis of right carotid arteries: Secondary | ICD-10-CM | POA: Diagnosis not present

## 2021-07-30 DIAGNOSIS — G959 Disease of spinal cord, unspecified: Secondary | ICD-10-CM | POA: Diagnosis not present

## 2021-07-30 DIAGNOSIS — Z7401 Bed confinement status: Secondary | ICD-10-CM | POA: Diagnosis not present

## 2021-07-30 DIAGNOSIS — K219 Gastro-esophageal reflux disease without esophagitis: Secondary | ICD-10-CM | POA: Diagnosis not present

## 2021-07-30 DIAGNOSIS — I639 Cerebral infarction, unspecified: Secondary | ICD-10-CM | POA: Diagnosis not present

## 2021-07-30 DIAGNOSIS — I679 Cerebrovascular disease, unspecified: Secondary | ICD-10-CM | POA: Diagnosis not present

## 2021-07-30 DIAGNOSIS — D649 Anemia, unspecified: Secondary | ICD-10-CM | POA: Diagnosis not present

## 2021-07-30 DIAGNOSIS — R279 Unspecified lack of coordination: Secondary | ICD-10-CM | POA: Diagnosis not present

## 2021-07-30 DIAGNOSIS — R2689 Other abnormalities of gait and mobility: Secondary | ICD-10-CM | POA: Diagnosis not present

## 2021-07-30 DIAGNOSIS — R1310 Dysphagia, unspecified: Secondary | ICD-10-CM | POA: Diagnosis not present

## 2021-07-30 NOTE — Progress Notes (Signed)
Patient discharged via ambulance service to rehab facility. VSS. Pt in no acute distress. Denies pain. Daughter [name] at bedside- [name] took all personal belongings and is heading to the rehab facility. All questions answered.

## 2021-08-02 ENCOUNTER — Encounter: Payer: Self-pay | Admitting: Adult Health

## 2021-08-02 ENCOUNTER — Non-Acute Institutional Stay (SKILLED_NURSING_FACILITY): Payer: Medicare Other | Admitting: Adult Health

## 2021-08-02 DIAGNOSIS — I63231 Cerebral infarction due to unspecified occlusion or stenosis of right carotid arteries: Secondary | ICD-10-CM | POA: Diagnosis not present

## 2021-08-02 DIAGNOSIS — I6350 Cerebral infarction due to unspecified occlusion or stenosis of unspecified cerebral artery: Secondary | ICD-10-CM

## 2021-08-02 DIAGNOSIS — K219 Gastro-esophageal reflux disease without esophagitis: Secondary | ICD-10-CM | POA: Diagnosis not present

## 2021-08-02 DIAGNOSIS — K5901 Slow transit constipation: Secondary | ICD-10-CM | POA: Diagnosis not present

## 2021-08-02 DIAGNOSIS — I951 Orthostatic hypotension: Secondary | ICD-10-CM | POA: Diagnosis not present

## 2021-08-02 DIAGNOSIS — G959 Disease of spinal cord, unspecified: Secondary | ICD-10-CM

## 2021-08-02 DIAGNOSIS — I10 Essential (primary) hypertension: Secondary | ICD-10-CM

## 2021-08-02 NOTE — Progress Notes (Signed)
Location:  [facility] Room Number: 309 A Place of Service:  SNF (31) Provider:  Kenard Gower, DNP, FNP-BC  Patient Care Team: [name] as PCP - General (Family Medicine)  Extended Emergency Contact Information Primary Emergency Contact: [name] Height [place] of [place] Mobile Phone: [phone] Relation: Daughter Secondary Emergency Contact: parrot,edna Mobile Phone: [phone] Relation: Relative  Code Status:  Full Code  Goals of care: Advanced Directive information Advanced Directives 07/28/2021  Does Patient Have a Medical Advance Directive? No  Type of Advance Directive -  Would patient like information on creating a medical advance directive? No - Patient declined  Pre-existing out of facility DNR order (yellow form or pink MOST form) -     Chief Complaint  Patient presents with   Hospitalization Follow-up    HPI:  Pt is a 87 y.o. female who was admitted to [facility] on 07/30/21 post hospital admission 07/27/2021 to 07/30/21.  She has a PMH of hypertension, hyperlipidemia, GERD and multiple TIAs.  She presented to the ED with left upper extremity numbness and blurred vision.  She is eating an independent living facility and stated that she had sudden onset of left upper extremity numbness and EMS was called and called a code stroke. Daughter reported that she has been complaining of dizziness, bilateral blurred vision and numbness in the left side off and on.  On further evaluation, she was found to have acute CVA.  CT head showed age-related atrophy and no acute abnormalities.  MRI scan shows few tiny subacute right frontal subcortical infarcts.  MRI scan cervical spine shows multifocal spinal stenosis with severe left-sided foraminal narrowing at C4-5 with likely root impingement.  CT angiogram showed no large vessel occlusion and showed moderate stenosis of the right ICA siphon due to calcified  plaque and mild right P2 atherosclerosis.  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  Neurology recommended aspirin 81 mg with Plavix 75 mg x 3 weeks then continue with aspirin alone.  She will need to follow-up with neurology in 2 months.  She was seen in the room today.  She complained of constipation.  Earlier, during her PT/OT, she complained of dizziness. BP while sitting was 98/60 and standing 87/56.  SBPs ranging from 124-138, with outlier 155.  She is currently taking Norvasc 2.5 mg in the afternoon and Coreg 12.5 mg twice a day for hypertension.   Past Medical History:  Diagnosis Date   GERD (gastroesophageal reflux disease)    Hyperlipemia    Hypertension    TIA (transient ischemic attack)    Past Surgical History:  Procedure Laterality Date   ABDOMINAL HYSTERECTOMY     TONSILLECTOMY      Allergies  Allergen Reactions   Tape Other (See Comments)    SKIN IS THIN- WILL BRUISE AND TEAR EASILY!!   Sulfa Antibiotics Hives    Outpatient Encounter Medications as of 08/02/2021  Medication Sig   acetaminophen (TYLENOL) 650 MG CR tablet Take 1,300 mg by mouth every 8 (eight) hours.   amLODipine (NORVASC) 2.5 MG tablet Take 2.5 mg by mouth daily in the afternoon.   ASPERCREME ARTHRITIS PAIN 1 % GEL Apply 2-4 g topically 4 (four) times daily as needed (for pain).   aspirin 81 MG chewable tablet Chew 81 mg by mouth daily at 3 pm.    BIOFREEZE 4 % GEL Apply 1 application topically 2 (two) times daily as needed (to painful areas).  carvedilol (COREG) 12.5 MG tablet Take 12.5 mg by mouth 2 (two) times daily with a meal.   clopidogrel (PLAVIX) 75 MG tablet Take 1 tablet (75 mg total) by mouth daily.   lovastatin (MEVACOR) 20 MG tablet Take 20 mg by mouth daily after supper.   pantoprazole (PROTONIX) 40 MG tablet Take 1 tablet (40 mg total) by mouth daily.   No facility-administered encounter medications on file as of 08/02/2021.    Review of  Systems  GENERAL: No change in appetite, no fatigue, no weight changes, no fever or chills  MOUTH and THROAT: Denies oral discomfort, gingival pain or bleeding RESPIRATORY: no cough, SOB, DOE, wheezing, hemoptysis CARDIAC: No chest pain, edema or palpitations GI: + Constipation GU: Denies dysuria, frequency, hematuria or discharge NEUROLOGICAL: +dizziness PSYCHIATRIC: Denies feelings of depression or anxiety. No report of hallucinations, insomnia, paranoia, or agitation    There is no immunization history on file for this patient. Pertinent  Health Maintenance Due  Topic Date Due   DEXA SCAN  Never done   PNA vac Low Risk Adult (1 of 2 - PCV13) Never done   INFLUENZA VACCINE  06/27/2021   No flowsheet data found.   Vitals:   08/02/21 1000  BP: 126/67  Pulse: 70  Resp: 20  Temp: (!) 97.3 F (36.3 C)  Weight: 160 lb (72.6 kg)  Height:  (1.575 m)   Body mass index is 29.26 kg/m.  Physical Exam  GENERAL APPEARANCE: Well nourished. In no acute distress.  Obese SKIN:  Skin is warm and dry.  MOUTH and THROAT: Lips are without lesions. Oral mucosa is moist and without lesions.  RESPIRATORY: Breathing is even & unlabored, BS CTAB CARDIAC: RRR, no murmur,no extra heart sounds, no edema GI: Abdomen soft, normal BS, no masses, no tenderness NEUROLOGICAL: There is no tremor. Speech is slurred.  Left-sided weakness. Alert and oriented X 3. PSYCHIATRIC:  Affect and behavior are appropriate  Labs reviewed: Recent Labs    07/23/21 0241 07/27/21 1530 07/27/21 1535  NA 129* 133* 134*  K 4.1 3.9 3.8  CL 91* 103 102  CO2 27 23  --   GLUCOSE 111* 108* 103*  BUN 25* 33* 31*  CREATININE 0.82 0.82 0.90  CALCIUM 10.3 8.2*  --    Recent Labs    07/23/21 0241 07/27/21 1530  AST 17 15  ALT 14 12  ALKPHOS 75 57  BILITOT 0.9 0.6  PROT 7.0 6.1*  ALBUMIN 3.3* 2.9*   Recent Labs    07/23/21 0241 07/27/21 1530 07/27/21 1535  WBC 8.6 7.6  --   NEUTROABS 5.5 5.2  --    HGB 11.5* 10.0* 10.2*  HCT 36.6 32.9* 30.0*  MCV 96.8 100.6*  --   PLT 270 264  --    Lab Results  Component Value Date   TSH 0.739 04/09/2019   Lab Results  Component Value Date   HGBA1C 6.5 (H) 07/27/2021   Lab Results  Component Value Date   CHOL 148 07/27/2021   HDL 45 07/27/2021   LDLCALC 62 07/27/2021   TRIG 205 (H) 07/27/2021   CHOLHDL 3.3 07/27/2021    Significant Diagnostic Results in last 30 days:  CT Angio Head W or Wo Contrast  Addendum Date: 07/23/2021   ADDENDUM REPORT: 07/23/2021 08:58 ADDENDUM: Upon re-review of this CTA performed at 0503 hours today, the following findings are noted: 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 But with patent left ophthalmic artery confirmed just proximal to this lesion. This addition was discussed by telephone with Dr Lorre Nick on 07/23/2021 at 0846 hours. We discussed that given patient age and the small size of this lesion, surveillance vessel imaging (e.g. annual with either MRA Head without contrast, or repeat CTA with contrast) may suffice. Electronically Signed   By: Odessa Fleming M.D.   On: 07/23/2021 08:58   Result Date: 07/23/2021 CLINICAL DATA:  87 year old female with dizziness and blurred vision. EXAM: CT ANGIOGRAPHY HEAD TECHNIQUE: Multidetector CT imaging of the head was performed using the standard protocol during bolus administration of intravenous contrast. Multiplanar CT image reconstructions and MIPs were obtained to evaluate the vascular anatomy. CONTRAST:  75mL OMNIPAQUE IOHEXOL 350 MG/ML SOLN COMPARISON:  Brain MRI 03/23/2016.  Head CT 03/08/2016. FINDINGS: CT HEAD Brain: Cerebral volume is stable and within normal limits for age. No midline shift, ventriculomegaly, mass effect, evidence of mass lesion, intracranial hemorrhage or evidence of cortically based acute infarction. Chronic small dystrophic calcifications in the right  cerebellum are stable. A small area of chronic white matter hypodensity in the inferior right frontal gyrus is stable. Gray-white matter differentiation otherwise within normal limits for age. Calvarium and skull base: No acute osseous abnormality identified. Paranasal sinuses: Subtotal new right sphenoid sinus mucosal thickening and opacification. Chronic left maxillary retention cysts. Other Visualized paranasal sinuses and mastoids are stable and well aerated. Tympanic cavities remain clear. Orbits: Postoperative changes to both globes since 2017. Visualized scalp soft tissues are within normal limits. CTA HEAD Posterior circulation: Codominant distal vertebral arteries are patent to the basilar. No distal vertebral plaque or stenosis. Normal PICA origins. Patent basilar artery without stenosis. Normal SCA and PCA origins. Left posterior communicating artery is present and the right is diminutive or absent. Left PCA branches are within normal limits. There is mild right PCA P2 irregularity and stenosis on series 15, image 20. Anterior circulation: Distal cervical ICAs are patent, the right is tortuous. Both ICA siphons are patent. Bilateral distal siphon calcified plaque. No significant stenosis on the left. Normal left ophthalmic and posterior communicating artery origins. On the right there is moderate anterior genu and proximal supraclinoid stenosis. Normal right ophthalmic artery origin. Patent carotid termini, MCA and ACA origins. Anterior communicating artery and bilateral ACA branches are within normal limits. Left MCA M1 segment and trifurcation are patent without stenosis. Right MCA M1 segment and bifurcation are patent without stenosis. Bilateral MCA branches are within normal limits. Venous sinuses: Early contrast timing. The superior sagittal sinus appears to be patent. Anatomic variants: None. Review of the MIP images confirms the above findings IMPRESSION: 1. Intracranial CTA is negative for large  vessel occlusion. There is moderate stenosis of the Right ICA siphon due to calcified plaque. Mild right PCA P2 segment atherosclerosis. 2. No acute intracranial abnormality. Stable CT appearance of the brain since 2017. 3. Right sphenoid sinus inflammation is new since 2017. Electronically Signed: By: Odessa Fleming M.D. On: 07/23/2021 05:16   MR ANGIO HEAD WO CONTRAST  Result Date: 07/27/2021 CLINICAL DATA:  Neuro deficit, acute, stroke suspected. EXAM: MRA HEAD WITHOUT CONTRAST TECHNIQUE: Angiographic images of the Circle of Willis were acquired using MRA technique without intravenous contrast. COMPARISON:  No pertinent prior exam. FINDINGS: POSTERIOR CIRCULATION: --Vertebral arteries: Normal --Inferior cerebellar arteries: Normal. --Basilar artery: Normal. --Superior cerebellar arteries: Normal. --Posterior cerebral arteries: Normal. ANTERIOR CIRCULATION: --Intracranial internal carotid arteries: Normal. --Anterior cerebral arteries (ACA): Normal. --Middle cerebral  arteries (MCA): Normal. ANATOMIC VARIANTS: None IMPRESSION: Normal intracranial MRA. Electronically Signed   By: Deatra Robinson M.D.   On: 07/27/2021 21:55   MR BRAIN WO CONTRAST  Result Date: 07/27/2021 CLINICAL DATA:  Neuro deficit, acute, stroke suspected EXAM: MRI HEAD WITHOUT CONTRAST TECHNIQUE: Multiplanar, multiecho pulse sequences of the brain and surrounding structures were obtained without intravenous contrast. COMPARISON:  Same-day CT head.  MRI February 27, 2016. FINDINGS: Motion limited study.  Within this limitation: Brain: Multiple small areas mild DWI hyperintensity in the high right frontal lobe (series 2, images 34-37). At least one of these lesions appears to have correlate ADC hypointensity (series 250, image 36) and therefore likely represents a recent infarct. The other areas are either hyperintense or isointense on ADC and may represent subacute infarcts or artifactual T2 shine through. Mild T2 hyperintensity in these areas without  mass effect. Additional mild for age scattered T2 hyperintensities in the supratentorial and pontine white matter, nonspecific but compatible with chronic microvascular ischemic disease. Small remote right cerebellar lacunar infarct. Mild for age atrophy. Vascular: Major arterial flow voids are maintained skull base. Skull and upper cervical spine: Normal marrow signal. Partially imaged upper cervical degenerative change with at least mild-to-moderate canal stenosis. Sinuses/Orbits: Mild paranasal sinus mucosal thickening. Left maxillary sinus retention cyst. No acute orbital findings. Other: No sizable mastoid effusions. IMPRESSION: Motion limited study. 1. Multiple small areas of mild DWI hyperintensity in the high right frontal lobe. At least one of these lesions appears to have correlate ADC hypointensity, suggestive of a recent infarct. The other areas are either hyperintense or isointense on ADC and may represent subacute infarcts and/or artifactual T2 shine through. 2. Mild for age chronic microvascular ischemic disease and atrophy. 3. Upper cervical spine degenerative change with at least mild-to-moderate canal stenosis. An MRI of the cervical spine could further evaluate if clinically indicated. Electronically Signed   By: Feliberto Harts M.D.   On: 07/27/2021 18:00   MR CERVICAL SPINE WO CONTRAST  Result Date: 07/28/2021 CLINICAL DATA:  Cervical radiculopathy EXAM: MRI CERVICAL SPINE WITHOUT CONTRAST TECHNIQUE: Multiplanar, multisequence MR imaging of the cervical spine was performed. No intravenous contrast was administered. COMPARISON:  None. FINDINGS: Alignment: There is 3 mm anterolisthesis of C2 on C3 and C3 on C4, likely degenerative in nature. There is reversal of the normal cervical spine lordosis with focal kyphosis centered at C4-C5. Vertebrae: Vertebral body heights are preserved, without evidence of acute fracture. There is mild reactive marrow signal abnormality in the left posterior  elements at C2 and C3. Marrow signal is otherwise normal. There is a prominent Schmorl's node indenting the superior C5 endplate. Cord: Normal signal and morphology. Posterior Fossa, vertebral arteries, paraspinal tissues: The paraspinal soft tissues are unremarkable. The vertebral artery flow voids are present. Disc levels: There is marked disc space narrowing at C4-C5 and C6-C7. There is mild-to-moderate disc desiccation and narrowing at the remaining levels. Craniocervical junction: There is prominent degenerative pannus about the dens resulting in mild narrowing of the craniocervical junction without mass effect on the underlying cord/cervicomedullary junction. C2-C3: There is a broad-based posterior disc osteophyte complex, ligamentum flavum thickening, and uncovertebral and facet arthropathy resulting in moderate to severe spinal canal stenosis with effacement of the thecal sac and mild mass effect on the cord, and moderate bilateral neural foraminal stenosis. There are small bilateral facet joint effusions. C3-C4: There is a mild posterior disc osteophyte complex, ligamentum flavum thickening, and uncovertebral and facet arthropathy resulting in mild spinal canal  stenosis with effacement of the ventral thecal sac and severe bilateral neural foraminal stenosis. C4-C5: There is a posterior disc osteophyte complex with prominent uncovertebral spurring, ligamentum flavum thickening, and bilateral facet arthropathy resulting in mild-to-moderate spinal canal stenosis and severe bilateral neural foraminal stenosis. C5-C6: There is a posterior disc osteophyte complex with a broad-based left paracentral protrusion, ligamentum flavum thickening, and uncovertebral and facet arthropathy resulting in moderate spinal canal stenosis with effacement of the thecal sac and severe bilateral neural foraminal stenosis C6-C7: There is a posterior disc osteophyte complex with a broad-based left paracentral protrusion, ligamentum  flavum thickening, and uncovertebral and facet arthropathy resulting in moderate spinal canal stenosis with effacement of the thecal sac and severe left worse than right neural foraminal stenosis C7-T1: There is degenerative endplate change and facet arthropathy resulting in moderate left and mild right neural foraminal stenosis without significant spinal canal stenosis. T1-T2: There is a mild disc bulge, degenerative endplate change, and bilateral facet arthropathy resulting in moderate left and mild right neural foraminal stenosis without significant spinal canal stenosis. IMPRESSION: 1. Grade 1 anterolisthesis of C2 on C3 and C3 on C4, likely degenerative in nature. 2. Moderate to severe spinal canal stenosis C2-C3 with mild mass effect on the cord but no evidence of cord signal abnormality. There is also moderate spinal canal stenosis at C5-C6 and C6-C7 without cord compression. 3. Additional advanced multilevel degenerative changes with severe neural foraminal stenosis at numerous levels described in detail above. 4. Mild reactive marrow signal abnormality in the posterior elements on the left C2-C3. Electronically Signed   By: Lesia Hausen M.D.   On: 07/28/2021 10:28   DG Chest Portable 1 View  Result Date: 07/23/2021 CLINICAL DATA:  Dizziness and blurry vision. EXAM: PORTABLE CHEST 1 VIEW COMPARISON:  Chest radiograph dated 04/09/2019. FINDINGS: Bilateral mid to lower lung field hazy densities may represent atelectasis or infiltrate. There is no pleural effusion pneumothorax. Stable cardiac silhouette. Atherosclerotic calcification the aorta. No acute osseous pathology. IMPRESSION: Bilateral mid to lower lung field atelectasis or infiltrate. Electronically Signed   By: Elgie Collard M.D.   On: 07/23/2021 03:07   ECHOCARDIOGRAM COMPLETE  Result Date: 07/28/2021    ECHOCARDIOGRAM REPORT   Patient Name:   Jill Hernandez Date of Exam: 07/28/2021 Medical Rec #:  [id number]        Height:       62.0 in  Accession #:    [id number]       Weight:       166.7 lb Date of Birth:  January 17, 1930        BSA:          1.769 m Patient Age:    89 years         BP:           147/59 mmHg Patient Gender: F                HR:           71 bpm. Exam Location:  Inpatient Procedure: 2D Echo, Cardiac Doppler and Color Doppler Indications:    Stroke I63.9  History:        Patient has prior history of Echocardiogram examinations, most                 recent 04/11/2019. TIA; Risk Factors:Hypertension and                 Dyslipidemia. GERD.  Sonographer:    Leta Jungling RDCS  Referring Phys: [id number] ALLISON WOLFE IMPRESSIONS  1. Left ventricular ejection fraction, by estimation, is 60 to 65%. The left ventricle has normal function. The left ventricle has no regional wall motion abnormalities. Left ventricular diastolic parameters are consistent with Grade I diastolic dysfunction (impaired relaxation).  2. Right ventricular systolic function is normal. The right ventricular size is normal. There is normal pulmonary artery systolic pressure.  3. The mitral valve is normal in structure. Mild mitral valve regurgitation. No evidence of mitral stenosis. Moderate mitral annular calcification.  4. The aortic valve is tricuspid. Aortic valve regurgitation is not visualized. Mild aortic valve sclerosis is present, with no evidence of aortic valve stenosis.  5. The inferior vena cava is normal in size with greater than 50% respiratory variability, suggesting right atrial pressure of 3 mmHg. Comparison(s): No significant change from prior study. Prior images reviewed side by side. Conclusion(s)/Recommendation(s): No intracardiac source of embolism detected on this transthoracic study. A transesophageal echocardiogram is recommended to exclude cardiac source of embolism if clinically indicated. FINDINGS  Left Ventricle: Left ventricular ejection fraction, by estimation, is 60 to 65%. The left ventricle has normal function. The left ventricle has no  regional wall motion abnormalities. The left ventricular internal cavity size was normal in size. There is  no left ventricular hypertrophy. Left ventricular diastolic parameters are consistent with Grade I diastolic dysfunction (impaired relaxation). Right Ventricle: The right ventricular size is normal. No increase in right ventricular wall thickness. Right ventricular systolic function is normal. There is normal pulmonary artery systolic pressure. The tricuspid regurgitant velocity is 2.78 m/s, and  with an assumed right atrial pressure of 3 mmHg, the estimated right ventricular systolic pressure is 33.9 mmHg. Left Atrium: Left atrial size was normal in size. Right Atrium: Right atrial size was normal in size. Pericardium: There is no evidence of pericardial effusion. Mitral Valve: The mitral valve is normal in structure. Moderate mitral annular calcification. Mild mitral valve regurgitation. No evidence of mitral valve stenosis. Tricuspid Valve: The tricuspid valve is normal in structure. Tricuspid valve regurgitation is mild . No evidence of tricuspid stenosis. Aortic Valve: The aortic valve is tricuspid. Aortic valve regurgitation is not visualized. Mild aortic valve sclerosis is present, with no evidence of aortic valve stenosis. Pulmonic Valve: The pulmonic valve was normal in structure. Pulmonic valve regurgitation is not visualized. No evidence of pulmonic stenosis. Aorta: The aortic root is normal in size and structure. Venous: The inferior vena cava is normal in size with greater than 50% respiratory variability, suggesting right atrial pressure of 3 mmHg. IAS/Shunts: No atrial level shunt detected by color flow Doppler.  LEFT VENTRICLE PLAX 2D LVIDd:         4.60 cm  Diastology LVIDs:         3.30 cm  LV e' medial:    4.50 cm/s LV PW:         0.80 cm  LV E/e' medial:  19.3 LV IVS:        0.80 cm  LV e' lateral:   4.62 cm/s LVOT diam:     1.80 cm  LV E/e' lateral: 18.9 LV SV:         53 LV SV Index:   30  LVOT Area:     2.54 cm  RIGHT VENTRICLE RV S prime:     9.58 cm/s LEFT ATRIUM             Index LA diam:        4.00 cm  2.26 cm/m LA Vol (A2C):   26.3 ml 14.87 ml/m LA Vol (A4C):   22.9 ml 12.94 ml/m LA Biplane Vol: 25.2 ml 14.24 ml/m  AORTIC VALVE LVOT Vmax:   93.80 cm/s LVOT Vmean:  55.000 cm/s LVOT VTI:    0.210 m  AORTA Ao Root diam: 2.80 cm Ao Asc diam:  2.90 cm MITRAL VALVE                TRICUSPID VALVE MV Area (PHT): 3.39 cm     TR Peak grad:   30.9 mmHg MV Decel Time: 224 msec     TR Vmax:        278.00 cm/s MV E velocity: 87.10 cm/s MV A velocity: 110.50 cm/s  SHUNTS MV E/A ratio:  0.79         Systemic VTI:  0.21 m                             Systemic Diam: 1.80 cm Donato Schultz MD Electronically signed by Donato Schultz MD Signature Date/Time: 07/28/2021/1:42:02 PM    Final    CT HEAD CODE STROKE WO CONTRAST  Result Date: 07/27/2021 CLINICAL DATA:  Code stroke. Acute neuro deficit. Rule out stroke. Left-sided weakness EXAM: CT HEAD WITHOUT CONTRAST TECHNIQUE: Contiguous axial images were obtained from the base of the skull through the vertex without intravenous contrast. COMPARISON:  CT head 07/23/2021 FINDINGS: Brain: No evidence of acute infarction, hemorrhage, hydrocephalus, extra-axial collection or mass lesion/mass effect. Mild white matter changes most likely due to chronic microvascular ischemic change. Vascular: Negative for hyperdense vessel Skull: Negative Sinuses/Orbits: Extensive mucosal edema right sphenoid sinus unchanged. Mild mucosal edema maxillary sinus bilaterally. Bilateral cataract extraction. No orbital mass. Other: None ASPECTS ([place]berta Stroke Program Early CT Score) - Ganglionic level infarction (caudate, lentiform nuclei, internal capsule, insula, M1-M3 cortex): 7 - Supraganglionic infarction (M4-M6 cortex): 3 Total score (0-10 with 10 being normal): 10 IMPRESSION: 1. No acute intracranial abnormality 2. ASPECTS is 10 3. Code stroke imaging results were communicated on  07/27/2021 at 3:45 pm to provider Pearlean Brownie via text page Electronically Signed   By: Marlan Palau M.D.   On: 07/27/2021 15:46   VAS US CAROTID  Result Date: 07/29/2021 Carotid Arterial Duplex Study Patient Name:  SAMANTH MIRKIN  Date of Exam:   07/29/2021 Medical Rec #: [id number]         Accession #:    [id number] Date of Birth: [birth year]         Patient Gender: F Patient Age:   83 years Exam Location:  [facility] Procedure:      VAS US CAROTID Referring Phys: Terrilee Files [facility] --------------------------------------------------------------------------------  Indications:       CVA. Risk Factors:      Hypertension, hyperlipidemia. Comparison Study:  02/28/16 prior Performing Technologist: [place] Ponder RVS  Examination Guidelines: A complete evaluation includes B-mode imaging, spectral Doppler, color Doppler, and power Doppler as needed of all accessible portions of each vessel. Bilateral testing is considered an integral part of a complete examination. Limited examinations for reoccurring indications may be performed as noted.  Right Carotid Findings: +----------+--------+--------+--------+------------------+--------+           PSV cm/sEDV cm/sStenosisPlaque DescriptionComments +----------+--------+--------+--------+------------------+--------+ CCA Prox  71      16              heterogenous               +----------+--------+--------+--------+------------------+--------+  CCA Distal63      15              heterogenous               +----------+--------+--------+--------+------------------+--------+ ICA Prox  61      8       1-39%   heterogenous               +----------+--------+--------+--------+------------------+--------+ ICA Distal59      19                                         +----------+--------+--------+--------+------------------+--------+ ECA       92      11                                          +----------+--------+--------+--------+------------------+--------+ +----------+--------+-------+--------+-------------------+           PSV cm/sEDV cmsDescribeArm Pressure (mmHG) +----------+--------+-------+--------+-------------------+ [id number]                                        +----------+--------+-------+--------+-------------------+ +---------+--------+--------+--------------+ VertebralPSV cm/sEDV cm/sNot identified +---------+--------+--------+--------------+  Left Carotid Findings: +----------+--------+--------+--------+------------------+--------+           PSV cm/sEDV cm/sStenosisPlaque DescriptionComments +----------+--------+--------+--------+------------------+--------+ CCA Prox  82                      heterogenous               +----------+--------+--------+--------+------------------+--------+ CCA Distal64      11              heterogenous               +----------+--------+--------+--------+------------------+--------+ ICA Prox  115     17      1-39%   heterogenous               +----------+--------+--------+--------+------------------+--------+ ICA Distal57      18                                         +----------+--------+--------+--------+------------------+--------+ ECA       124                                                +----------+--------+--------+--------+------------------+--------+ +----------+--------+--------+--------+-------------------+           PSV cm/sEDV cm/sDescribeArm Pressure (mmHG) +----------+--------+--------+--------+-------------------+ [id number]                                          +----------+--------+--------+--------+-------------------+ +---------+--------+--------+--------------+ VertebralPSV cm/sEDV cm/sNot identified +---------+--------+--------+--------------+   Summary: Right Carotid: Velocities in the right ICA are consistent with a 1-39% stenosis. Left Carotid:  Velocities in the left ICA are consistent with a 1-39% stenosis. Vertebrals: Bilateral vertebral arteries were not visualized. *See table(s) above for measurements and observations.  Electronically signed by Delia Heady MD on 07/29/2021 at 5:17:54 PM.  Final     Assessment/Plan  1. Cerebrovascular accident (CVA) due to occlusion of right carotid artery (HCC) -   Continue aspirin and Plavix x3 weeks followed by aspirin alone -   Follow-up with neurology, Dr. Delia Heady, in 2 months -    Continue PT, OT and ST  2. Cervical myelopathy [facility]) -    Neurology discussed with patient/family and plans are to manage this with conservative measures, family does not want to pursue surgical decompression  3. Primary hypertension -    Continue amlodipine and Coreg  4. Gastroesophageal reflux disease without esophagitis -   Continue Protonix  5. Orthostatic hypotension -   will start on bilateral TED hose on in a.m. and off at bedtime  6.  Slow transit constipation -   Start Senna-S 8.6/50 mg 2 tabs twice a day and MiraLAX 17 g daily   Family/ staff Communication: Discussed plan of care with resident and charge nurse.  Labs/tests ordered:    CBC and CMP  Goals of care:   Short-term care   Kenard Gower, DNP, MSN, FNP-BC [facility] [phone] (Monday-Friday 8:00 a.m. - 5:00 p.m.) [phone] (after hours)

## 2021-08-03 ENCOUNTER — Non-Acute Institutional Stay (SKILLED_NURSING_FACILITY): Payer: Medicare Other | Admitting: Internal Medicine

## 2021-08-03 ENCOUNTER — Encounter: Payer: Self-pay | Admitting: Internal Medicine

## 2021-08-03 DIAGNOSIS — E785 Hyperlipidemia, unspecified: Secondary | ICD-10-CM

## 2021-08-03 DIAGNOSIS — I639 Cerebral infarction, unspecified: Secondary | ICD-10-CM | POA: Diagnosis not present

## 2021-08-03 DIAGNOSIS — E1151 Type 2 diabetes mellitus with diabetic peripheral angiopathy without gangrene: Secondary | ICD-10-CM | POA: Diagnosis not present

## 2021-08-03 DIAGNOSIS — D649 Anemia, unspecified: Secondary | ICD-10-CM | POA: Diagnosis not present

## 2021-08-03 DIAGNOSIS — M4802 Spinal stenosis, cervical region: Secondary | ICD-10-CM | POA: Diagnosis not present

## 2021-08-03 DIAGNOSIS — I951 Orthostatic hypotension: Secondary | ICD-10-CM | POA: Insufficient documentation

## 2021-08-03 LAB — HEPATIC FUNCTION PANEL
ALT: 8 (ref 7–35)
AST: 14 (ref 13–35)
Alkaline Phosphatase: 75 (ref 25–125)
Bilirubin, Total: 0.5

## 2021-08-03 LAB — CBC: RBC: 3.43 — AB (ref 3.87–5.11)

## 2021-08-03 LAB — BASIC METABOLIC PANEL
BUN: 25 — AB (ref 4–21)
CO2: 27 — AB (ref 13–22)
Chloride: 96 — AB (ref 99–108)
Creatinine: 0.9 (ref 0.5–1.1)
Glucose: 91
Potassium: 4.9 (ref 3.4–5.3)
Sodium: 135 — AB (ref 137–147)

## 2021-08-03 LAB — CBC AND DIFFERENTIAL
HCT: 32 — AB (ref 36–46)
Hemoglobin: 10.6 — AB (ref 12.0–16.0)
Platelets: 269 (ref 150–399)
WBC: 7.8

## 2021-08-03 LAB — COMPREHENSIVE METABOLIC PANEL
Albumin: 3.5 (ref 3.5–5.0)
Calcium: 9.4 (ref 8.7–10.7)
GFR calc Af Amer: 65.89
GFR calc non Af Amer: 56.85
Globulin: 2.6

## 2021-08-03 NOTE — Assessment & Plan Note (Addendum)
Because of PMH  ( CVA) & co-morbidities ( HTN)  Current LDL 62; goal is < 70. No change indicated.

## 2021-08-03 NOTE — Progress Notes (Signed)
NURSING HOME LOCATION:  [facility] ROOM NUMBER:  [id number]  CODE STATUS:  Full Code  PCP:  Jarrett Soho PA-C  This is a comprehensive admission note to this SNFperformed on this date less than 30 days from date of admission. Included are preadmission medical/surgical history; reconciled medication list; family history; social history and comprehensive review of systems.  Corrections and additions to the records were documented. Comprehensive physical exam was also performed. Additionally a clinical summary was entered for each active diagnosis pertinent to this admission in the Problem List to enhance continuity of care.  HPI: She was hospitalized 8/31 - 07/29/2021 presenting with left upper extremity numbness.  Head CT revealed no acute intracranial abnormality.  CTA of the head revealed an incidental left ICA paraophthalmic artery aneurysm.  MRI revealed subacute infarcts in the right frontal lobe.  MRA of the brain revealed no significant stenoses.  Carotid Doppler also revealed no significant stenoses.  MRI of the C-spine revealed moderate to severe spinal canal stenosis at C2-C3 with mild mass-effect on the cord but without evidence of cord signal abnormality.  Moderate spinal canal stenosis was found at C5-C6 and C6-C7. LDL was 62 and A1c 6.5%. Neurosurgery consulted.  Surgical decompression of the spinal canal stenoses was not recommended.  It was questioned this might be related to her intermittent left upper extremity numbness.   Neurology recommended dual therapy with aspirin and Plavix x3 weeks followed by aspirin alone. The incidentally noted left ICA paraophthalmic aneurysm was to be monitored in the outpatient setting. Blood pressure was stable; permissive hypertension in the setting of CVA was allowed.  Low-dose amlodipine and carvedilol were to be resumed at discharge. She was discharged to the SNF for rehab.  Past medical and surgical history: Includes  GERD, dyslipidemia, essential hypertension, and TIA. Surgical procedures include abdominal hysterectomy.  Social history: She is a former smoker; she is a nondrinker.  She is retired [employer] fundraiser.  Family history: Noncontributory due to advanced age.   Review of systems: She understands while she was hospitalized.  She realizes that she is here for PT/OT rehab.  She describes having a "rough morning" related to being lightheaded with blurred vision when she stood to participate in PT/OT.  She states that the blurred vision is a frequent issue, almost daily.  She validates that when she stood her blood pressure was 90 over?Marland Kitchen   She also describes indigestion as well as chronic constipation and "gas".  She states that she has "rheumatoid arthritis" in the knees and neck.  She thinks the spinal stenosis is related to rheumatoid arthritis.   She describes chronic numbness and tingling in feet.  She states she has been told she snores but denies any apnea.   Despite the slightly progressive anemia; she denies any bleeding dyscrasias.  Constitutional: No fever, significant weight change,  Eyes: No redness, discharge, pain ENT/mouth: No nasal congestion, purulent discharge, earache, change in hearing, sore throat  Cardiovascular: No chest pain, palpitations, paroxysmal nocturnal dyspnea, claudication, edema  Respiratory: No cough, sputum production, hemoptysis, DOE Gastrointestinal: No  dysphagia, abdominal pain, nausea /vomiting, rectal bleeding, melena Genitourinary: No dysuria, hematuria, pyuria, incontinence, nocturia Dermatologic: No rash, pruritus, change in appearance of skin Neurologic: No headache, syncope, seizures Psychiatric: No significant anxiety, depression, insomnia, anorexia Endocrine: No change in hair/skin/nails, excessive thirst, excessive hunger, excessive urination  Hematologic/lymphatic: No significant bruising, lymphadenopathy, abnormal bleeding Allergy/immunology: No  itchy/watery eyes, significant sneezing, urticaria, angioedema  Physical exam:  Pertinent  or positive findings: Ptosis is present, greater on the right.  Anisocoria is also present with the right pupil larger than the left.  The left nasolabial fold is decreased.  She is edentulous.  Speech is slightly slurred.  There is fine hirsutism over the chin.  Breath sounds are somewhat decreased.  Abdomen is protuberant.  Dorsalis pedis pulses are stronger than posterior tibial pulses.  She has trace edema at the sock line.  She is weak to opposition especially in the lower extremities.  She has fusiform change of the knees, greater on the left than the right.  She has patchy scaly dermatitis of the left forearm and left lower extremity.  General appearance: Adequately nourished; no acute distress, increased work of breathing is present.   Lymphatic: No lymphadenopathy about the head, neck, axilla. Eyes: No conjunctival inflammation or lid edema is present. There is no scleral icterus. Ears:  External ear exam shows no significant lesions or deformities.   Nose:  External nasal examination shows no deformity or inflammation. Nasal mucosa are pink and moist without lesions, exudates Neck:  No thyromegaly, masses, tenderness noted.    Heart:  Normal rate and regular rhythm. S1 and S2 normal without gallop, murmur, click, rub.  Lungs: without wheezes, rhonchi, rales, rubs. Abdomen: Bowel sounds are normal.  Abdomen is soft and nontender with no organomegaly, hernias, masses. GU: Deferred  Extremities:  No cyanosis, clubbing Neurologic exam: Balance, Rhomberg, finger to nose testing could not be completed due to clinical state Skin: Warm & dry w/o tenting.  See clinical summary under each active problem in the Problem List with associated updated therapeutic plan

## 2021-08-03 NOTE — Assessment & Plan Note (Addendum)
Current H/H 10/30 H/H  Indices both normocytic & macrocytic Anemia stable  ROS negative for bleeding dyscrasias & none reported by Staff Monitor CBC ; iron panel &  B12 level if progressive

## 2021-08-03 NOTE — Patient Instructions (Signed)
See assessment and plan under each diagnosis in the problem list and acutely for this visit 

## 2021-08-03 NOTE — Assessment & Plan Note (Addendum)
Isometric exercises discussed; to perform  4- 5 times prior to standing if having been seated or supine  for a period of time. TED hose may be of value for persistent symptoms.

## 2021-08-03 NOTE — Assessment & Plan Note (Addendum)
DM with neurovascular complications Glucose range as IP : 103-111 Current A1c: 6.5% A1c goal : <8% No hypoglycemia No change indicated

## 2021-08-03 NOTE — Assessment & Plan Note (Signed)
PT/OT @ SNF as tolerated. 

## 2021-08-03 NOTE — Assessment & Plan Note (Signed)
Monitor for neuropathic symptom progression

## 2021-08-08 DIAGNOSIS — H43813 Vitreous degeneration, bilateral: Secondary | ICD-10-CM | POA: Diagnosis not present

## 2021-08-10 ENCOUNTER — Encounter: Payer: Self-pay | Admitting: Adult Health

## 2021-08-10 ENCOUNTER — Non-Acute Institutional Stay (SKILLED_NURSING_FACILITY): Payer: Medicare Other | Admitting: Adult Health

## 2021-08-10 DIAGNOSIS — I10 Essential (primary) hypertension: Secondary | ICD-10-CM

## 2021-08-10 DIAGNOSIS — I63541 Cerebral infarction due to unspecified occlusion or stenosis of right cerebellar artery: Secondary | ICD-10-CM

## 2021-08-10 DIAGNOSIS — E1151 Type 2 diabetes mellitus with diabetic peripheral angiopathy without gangrene: Secondary | ICD-10-CM | POA: Diagnosis not present

## 2021-08-10 NOTE — Progress Notes (Signed)
Location:  [facility] of Service:  SNF (31) Provider:  Kenard Gower, DNP, FNP-BC  Patient Care Team: [name] as PCP - General (Family Medicine)  Extended Emergency Contact Information Primary Emergency Contact: [name] Height [place] of [place] Mobile Phone: [phone] Relation: Daughter Secondary Emergency Contact: parrot,edna Mobile Phone: [phone] Relation: Relative  Code Status: Full code   Goals of care: Advanced Directive information Advanced Directives 08/10/2021  Does Patient Have a Medical Advance Directive? No  Type of Advance Directive -  Would patient like information on creating a medical advance directive? No - Patient declined  Pre-existing out of facility DNR order (yellow form or pink MOST form) -     Chief Complaint  Patient presents with   Acute Visit    Short term rehabilitation     HPI:  Pt is a 87 y.o. female seen today for medical management of chronic diseases. She was admitted to [facility] on 07/30/21 post hospital admission 07/27/2021 to 07/30/21.  She has a PMH of hypertension, hyperlipidemia, GERD and multiple TIAs.  She presented to the ED with left upper extremity numbness and blurred vision.  She lives in an independent living facility and stated that she had a sudden onset of left upper extremity numbness and EMS was called and called a code stroke.  Daughter reported that she has been complaining of dizziness, bilateral blurred vision and numbness in the left side off and on.  On further evaluation, he was found to have acute CVA.  CT head showed age-related atrophy and no acute abnormalities.  MRI scan showed few tiny subacute right frontal subcortical infarcts.  MRI scan cervical spine showed multifocal spinal stenosis with severe left-sided foraminal narrowing at C4-5 with likely root impingement.  CT angiogram showed no large vessel occlusion and showed moderate  stenosis of the right ICA siphon due to calcified plaque and mild right P2 atherosclerosis.  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  Neurology recommended aspirin 81 mg with Plavix 75 mg x 3 weeks then continue with aspirin alone.  At [facility], she is currently having PT and OT. SBPs ranging from 128 to 138, with outlier 150. She takes Coreg 12.5 mg BID and Norvasc 2.5 mg daily for hypertension. She takes  ASA 81 mg daily and Plavix 75 mg daily for CVA. She takes Lovastatin 20 mg daily for hyperlipidemia   Past Medical History:  Diagnosis Date   GERD (gastroesophageal reflux disease)    Hyperlipemia    Hypertension    TIA (transient ischemic attack)    Past Surgical History:  Procedure Laterality Date   ABDOMINAL HYSTERECTOMY     TONSILLECTOMY      Allergies  Allergen Reactions   Tape Other (See Comments)    SKIN IS THIN- WILL BRUISE AND TEAR EASILY!!   Sulfa Antibiotics Hives    Outpatient Encounter Medications as of 08/10/2021  Medication Sig   acetaminophen (TYLENOL) 650 MG CR tablet Take 1,300 mg by mouth every 8 (eight) hours.   amLODipine (NORVASC) 2.5 MG tablet Take 2.5 mg by mouth daily in the afternoon.   ASPERCREME ARTHRITIS PAIN 1 % GEL Apply 2-4 g topically 4 (four) times daily as needed (for pain).   aspirin 81 MG chewable tablet Chew 81 mg by mouth daily at 3 pm.    BIOFREEZE 4 % GEL Apply 1 application topically 2 (two) times daily as needed (to painful areas).   bisacodyl (  DULCOLAX) 10 MG suppository as needed. If not relieved by MOM, give 10 mg Bisacodyl suppositiory rectally X 1 dose in 24 hours as needed   carvedilol (COREG) 12.5 MG tablet Take 12.5 mg by mouth 2 (two) times daily with a meal.   clopidogrel (PLAVIX) 75 MG tablet Take 1 tablet (75 mg total) by mouth daily.   Lidocaine-Glycerin (PREPARATION H EX) Apply topically. Apply cream topically to external Hemorrhoids twice daily PRN for Irriation   lovastatin (MEVACOR) 20 MG  tablet Take 20 mg by mouth daily after supper.   magnesium hydroxide (MILK OF MAGNESIA) 400 MG/5ML suspension If no BM in 3 days, give 30 cc Milk of Magnesium p.o. x 1 dose in 24 hours as needed (Do not use standing constipation orders for residents with renal failure CFR less than 30. Contact MD for orders) (Physician Order)   NON FORMULARY diet clarification - pureed Heart healthy diet   Nutritional Supplements (ENSURE PO) Take 237 mLs by mouth in the morning and at bedtime.   pantoprazole (PROTONIX) 40 MG tablet Take 1 tablet (40 mg total) by mouth daily.   sennosides-docusate sodium (SENOKOT-S) 8.6-50 MG tablet Take 2 tablets by mouth 2 (two) times daily.   Sodium Phosphates (RA SALINE ENEMA RE) Place rectally. If not relieved by Biscodyl suppository, give disposable Saline Enema rectally X 1 dose/24 hrs as needed   No facility-administered encounter medications on file as of 08/10/2021.    Review of Systems  GENERAL: No change in appetite, no fatigue, no weight changes, no fever, chills or weakness MOUTH and THROAT: Denies oral discomfort, gingival pain or bleeding RESPIRATORY: no cough, SOB, DOE, wheezing, hemoptysis CARDIAC: No chest pain, edema or palpitations GI: No abdominal pain, diarrhea, constipation, heart burn, nausea or vomiting GU: Denies dysuria, frequency, hematuria, incontinence, or discharge NEUROLOGICAL: Denies dizziness, syncope, numbness, or headache PSYCHIATRIC: Denies feelings of depression or anxiety. No report of hallucinations, insomnia, paranoia, or agitation   Immunization History  Administered Date(s) Administered   Influenza-Unspecified 08/27/2020   Pertinent  Health Maintenance Due  Topic Date Due   FOOT EXAM  Never done   OPHTHALMOLOGY EXAM  Never done   URINE MICROALBUMIN  Never done   DEXA SCAN  Never done   INFLUENZA VACCINE  06/27/2021   HEMOGLOBIN A1C  01/24/2022   No flowsheet data found.   Vitals:   08/10/21 1543  BP: 137/67  Pulse:  76  Resp: 20  Temp: (!) 97.5 F (36.4 C)  Weight: 160 lb 9.6 oz (72.8 kg)  Height: 5\' 2"  (1.575 m)   Body mass index is 29.37 kg/m.  Physical Exam  GENERAL APPEARANCE: Well nourished. In no acute distress. Obese. SKIN:  Skin is warm and dry.  MOUTH and THROAT: Lips are without lesions. Oral mucosa is moist and without lesions.  RESPIRATORY: Breathing is even & unlabored, BS CTAB CARDIAC: RRR, no murmur,no extra heart sounds, no edema GI: Abdomen soft, normal BS, no masses, no tenderness NEUROLOGICAL: There is no tremor. Speech is clear. Alert and oriented X 3 PSYCHIATRIC:  Affect and behavior are appropriate  Labs reviewed: Recent Labs    07/23/21 0241 07/27/21 1530 07/27/21 1535 08/03/21 0000  NA 129* 133* 134* 135*  K 4.1 3.9 3.8 4.9  CL 91* 103 102 96*  CO2 27 23  --  27*  GLUCOSE 111* 108* 103*  --   BUN 25* 33* 31* 25*  CREATININE 0.82 0.82 0.90 0.9  CALCIUM 10.3 8.2*  --  9.4  Recent Labs    07/23/21 0241 07/27/21 1530 08/03/21 0000  AST ALT ALKPHOS 75 57 75  BILITOT 0.9 0.6  --   PROT 7.0 6.1*  --   ALBUMIN 3.3* 2.9* 3.5   Recent Labs    07/23/21 0241 07/27/21 1530 07/27/21 1535 08/03/21 0000  WBC 8.6 7.6  --  7.8  NEUTROABS 5.5 5.2  --   --   HGB 11.5* 10.0* 10.2* 10.6*  HCT 36.6 32.9* 30.0* 32*  MCV 96.8 100.6*  --   --   PLT 270 264  --  269   Lab Results  Component Value Date   TSH 0.739 04/09/2019   Lab Results  Component Value Date   HGBA1C 6.5 (H) 07/27/2021   Lab Results  Component Value Date   CHOL 148 07/27/2021   HDL 45 07/27/2021   LDLCALC 62 07/27/2021   TRIG 205 (H) 07/27/2021   CHOLHDL 3.3 07/27/2021    Significant Diagnostic Results in last 30 days:  MR ANGIO HEAD WO CONTRAST  Result Date: 07/27/2021 CLINICAL DATA:  Neuro deficit, acute, stroke suspected. EXAM: MRA HEAD WITHOUT CONTRAST TECHNIQUE: Angiographic images of the Circle of Willis were acquired using MRA technique without intravenous  contrast. COMPARISON:  No pertinent prior exam. FINDINGS: POSTERIOR CIRCULATION: --Vertebral arteries: Normal --Inferior cerebellar arteries: Normal. --Basilar artery: Normal. --Superior cerebellar arteries: Normal. --Posterior cerebral arteries: Normal. ANTERIOR CIRCULATION: --Intracranial internal carotid arteries: Normal. --Anterior cerebral arteries (ACA): Normal. --Middle cerebral arteries (MCA): Normal. ANATOMIC VARIANTS: None IMPRESSION: Normal intracranial MRA. Electronically Signed   By: Deatra Robinson M.D.   On: 07/27/2021 21:55   MR BRAIN WO CONTRAST  Result Date: 07/27/2021 CLINICAL DATA:  Neuro deficit, acute, stroke suspected EXAM: MRI HEAD WITHOUT CONTRAST TECHNIQUE: Multiplanar, multiecho pulse sequences of the brain and surrounding structures were obtained without intravenous contrast. COMPARISON:  Same-day CT head.  MRI February 27, 2016. FINDINGS: Motion limited study.  Within this limitation: Brain: Multiple small areas mild DWI hyperintensity in the high right frontal lobe (series 2, images 34-37). At least one of these lesions appears to have correlate ADC hypointensity (series 250, image 36) and therefore likely represents a recent infarct. The other areas are either hyperintense or isointense on ADC and may represent subacute infarcts or artifactual T2 shine through. Mild T2 hyperintensity in these areas without mass effect. Additional mild for age scattered T2 hyperintensities in the supratentorial and pontine white matter, nonspecific but compatible with chronic microvascular ischemic disease. Small remote right cerebellar lacunar infarct. Mild for age atrophy. Vascular: Major arterial flow voids are maintained skull base. Skull and upper cervical spine: Normal marrow signal. Partially imaged upper cervical degenerative change with at least mild-to-moderate canal stenosis. Sinuses/Orbits: Mild paranasal sinus mucosal thickening. Left maxillary sinus retention cyst. No acute orbital findings.  Other: No sizable mastoid effusions. IMPRESSION: Motion limited study. 1. Multiple small areas of mild DWI hyperintensity in the high right frontal lobe. At least one of these lesions appears to have correlate ADC hypointensity, suggestive of a recent infarct. The other areas are either hyperintense or isointense on ADC and may represent subacute infarcts and/or artifactual T2 shine through. 2. Mild for age chronic microvascular ischemic disease and atrophy. 3. Upper cervical spine degenerative change with at least mild-to-moderate canal stenosis. An MRI of the cervical spine could further evaluate if clinically indicated. Electronically Signed   By: Feliberto Harts M.D.   On: 07/27/2021 18:00   MR CERVICAL SPINE  WO CONTRAST  Result Date: 07/28/2021 CLINICAL DATA:  Cervical radiculopathy EXAM: MRI CERVICAL SPINE WITHOUT CONTRAST TECHNIQUE: Multiplanar, multisequence MR imaging of the cervical spine was performed. No intravenous contrast was administered. COMPARISON:  None. FINDINGS: Alignment: There is 3 mm anterolisthesis of C2 on C3 and C3 on C4, likely degenerative in nature. There is reversal of the normal cervical spine lordosis with focal kyphosis centered at C4-C5. Vertebrae: Vertebral body heights are preserved, without evidence of acute fracture. There is mild reactive marrow signal abnormality in the left posterior elements at C2 and C3. Marrow signal is otherwise normal. There is a prominent Schmorl's node indenting the superior C5 endplate. Cord: Normal signal and morphology. Posterior Fossa, vertebral arteries, paraspinal tissues: The paraspinal soft tissues are unremarkable. The vertebral artery flow voids are present. Disc levels: There is marked disc space narrowing at C4-C5 and C6-C7. There is mild-to-moderate disc desiccation and narrowing at the remaining levels. Craniocervical junction: There is prominent degenerative pannus about the dens resulting in mild narrowing of the craniocervical  junction without mass effect on the underlying cord/cervicomedullary junction. C2-C3: There is a broad-based posterior disc osteophyte complex, ligamentum flavum thickening, and uncovertebral and facet arthropathy resulting in moderate to severe spinal canal stenosis with effacement of the thecal sac and mild mass effect on the cord, and moderate bilateral neural foraminal stenosis. There are small bilateral facet joint effusions. C3-C4: There is a mild posterior disc osteophyte complex, ligamentum flavum thickening, and uncovertebral and facet arthropathy resulting in mild spinal canal stenosis with effacement of the ventral thecal sac and severe bilateral neural foraminal stenosis. C4-C5: There is a posterior disc osteophyte complex with prominent uncovertebral spurring, ligamentum flavum thickening, and bilateral facet arthropathy resulting in mild-to-moderate spinal canal stenosis and severe bilateral neural foraminal stenosis. C5-C6: There is a posterior disc osteophyte complex with a broad-based left paracentral protrusion, ligamentum flavum thickening, and uncovertebral and facet arthropathy resulting in moderate spinal canal stenosis with effacement of the thecal sac and severe bilateral neural foraminal stenosis C6-C7: There is a posterior disc osteophyte complex with a broad-based left paracentral protrusion, ligamentum flavum thickening, and uncovertebral and facet arthropathy resulting in moderate spinal canal stenosis with effacement of the thecal sac and severe left worse than right neural foraminal stenosis C7-T1: There is degenerative endplate change and facet arthropathy resulting in moderate left and mild right neural foraminal stenosis without significant spinal canal stenosis. T1-T2: There is a mild disc bulge, degenerative endplate change, and bilateral facet arthropathy resulting in moderate left and mild right neural foraminal stenosis without significant spinal canal stenosis. IMPRESSION: 1.  Grade 1 anterolisthesis of C2 on C3 and C3 on C4, likely degenerative in nature. 2. Moderate to severe spinal canal stenosis C2-C3 with mild mass effect on the cord but no evidence of cord signal abnormality. There is also moderate spinal canal stenosis at C5-C6 and C6-C7 without cord compression. 3. Additional advanced multilevel degenerative changes with severe neural foraminal stenosis at numerous levels described in detail above. 4. Mild reactive marrow signal abnormality in the posterior elements on the left C2-C3. Electronically Signed   By: Lesia Hausen M.D.   On: 07/28/2021 10:28   ECHOCARDIOGRAM COMPLETE  Result Date: 07/28/2021    ECHOCARDIOGRAM REPORT   Patient Name:   Jill Hernandez Date of Exam: 07/28/2021 Medical Rec #:  [id number]        Height:       62.0 in Accession #:    [id number]  Weight:       166.7 lb Date of Birth:  1929-12-27        BSA:          1.769 m Patient Age:    89 years         BP:           147/59 mmHg Patient Gender: F                HR:           71 bpm. Exam Location:  Inpatient Procedure: 2D Echo, Cardiac Doppler and Color Doppler Indications:    Stroke I63.9  History:        Patient has prior history of Echocardiogram examinations, most                 recent 04/11/2019. TIA; Risk Factors:Hypertension and                 Dyslipidemia. GERD.  Sonographer:    Leta Jungling RDCS Referring Phys: [id number] ALLISON WOLFE IMPRESSIONS  1. Left ventricular ejection fraction, by estimation, is 60 to 65%. The left ventricle has normal function. The left ventricle has no regional wall motion abnormalities. Left ventricular diastolic parameters are consistent with Grade I diastolic dysfunction (impaired relaxation).  2. Right ventricular systolic function is normal. The right ventricular size is normal. There is normal pulmonary artery systolic pressure.  3. The mitral valve is normal in structure. Mild mitral valve regurgitation. No evidence of mitral stenosis. Moderate mitral  annular calcification.  4. The aortic valve is tricuspid. Aortic valve regurgitation is not visualized. Mild aortic valve sclerosis is present, with no evidence of aortic valve stenosis.  5. The inferior vena cava is normal in size with greater than 50% respiratory variability, suggesting right atrial pressure of 3 mmHg. Comparison(s): No significant change from prior study. Prior images reviewed side by side. Conclusion(s)/Recommendation(s): No intracardiac source of embolism detected on this transthoracic study. A transesophageal echocardiogram is recommended to exclude cardiac source of embolism if clinically indicated. FINDINGS  Left Ventricle: Left ventricular ejection fraction, by estimation, is 60 to 65%. The left ventricle has normal function. The left ventricle has no regional wall motion abnormalities. The left ventricular internal cavity size was normal in size. There is  no left ventricular hypertrophy. Left ventricular diastolic parameters are consistent with Grade I diastolic dysfunction (impaired relaxation). Right Ventricle: The right ventricular size is normal. No increase in right ventricular wall thickness. Right ventricular systolic function is normal. There is normal pulmonary artery systolic pressure. The tricuspid regurgitant velocity is 2.78 m/s, and  with an assumed right atrial pressure of 3 mmHg, the estimated right ventricular systolic pressure is 33.9 mmHg. Left Atrium: Left atrial size was normal in size. Right Atrium: Right atrial size was normal in size. Pericardium: There is no evidence of pericardial effusion. Mitral Valve: The mitral valve is normal in structure. Moderate mitral annular calcification. Mild mitral valve regurgitation. No evidence of mitral valve stenosis. Tricuspid Valve: The tricuspid valve is normal in structure. Tricuspid valve regurgitation is mild . No evidence of tricuspid stenosis. Aortic Valve: The aortic valve is tricuspid. Aortic valve regurgitation is not  visualized. Mild aortic valve sclerosis is present, with no evidence of aortic valve stenosis. Pulmonic Valve: The pulmonic valve was normal in structure. Pulmonic valve regurgitation is not visualized. No evidence of pulmonic stenosis. Aorta: The aortic root is normal in size and structure. Venous: The inferior vena cava is normal in  size with greater than 50% respiratory variability, suggesting right atrial pressure of 3 mmHg. IAS/Shunts: No atrial level shunt detected by color flow Doppler.  LEFT VENTRICLE PLAX 2D LVIDd:         4.60 cm  Diastology LVIDs:         3.30 cm  LV e' medial:    4.50 cm/s LV PW:         0.80 cm  LV E/e' medial:  19.3 LV IVS:        0.80 cm  LV e' lateral:   4.62 cm/s LVOT diam:     1.80 cm  LV E/e' lateral: 18.9 LV SV:         53 LV SV Index:   30 LVOT Area:     2.54 cm  RIGHT VENTRICLE RV S prime:     9.58 cm/s LEFT ATRIUM             Index LA diam:        4.00 cm 2.26 cm/m LA Vol (A2C):   26.3 ml 14.87 ml/m LA Vol (A4C):   22.9 ml 12.94 ml/m LA Biplane Vol: 25.2 ml 14.24 ml/m  AORTIC VALVE LVOT Vmax:   93.80 cm/s LVOT Vmean:  55.000 cm/s LVOT VTI:    0.210 m  AORTA Ao Root diam: 2.80 cm Ao Asc diam:  2.90 cm MITRAL VALVE                TRICUSPID VALVE MV Area (PHT): 3.39 cm     TR Peak grad:   30.9 mmHg MV Decel Time: 224 msec     TR Vmax:        278.00 cm/s MV E velocity: 87.10 cm/s MV A velocity: 110.50 cm/s  SHUNTS MV E/A ratio:  0.79         Systemic VTI:  0.21 m                             Systemic Diam: 1.80 cm Donato Schultz MD Electronically signed by Donato Schultz MD Signature Date/Time: 07/28/2021/1:42:02 PM    Final    CT HEAD CODE STROKE WO CONTRAST  Result Date: 07/27/2021 CLINICAL DATA:  Code stroke. Acute neuro deficit. Rule out stroke. Left-sided weakness EXAM: CT HEAD WITHOUT CONTRAST TECHNIQUE: Contiguous axial images were obtained from the base of the skull through the vertex without intravenous contrast. COMPARISON:  CT head 07/23/2021 FINDINGS: Brain: No  evidence of acute infarction, hemorrhage, hydrocephalus, extra-axial collection or mass lesion/mass effect. Mild white matter changes most likely due to chronic microvascular ischemic change. Vascular: Negative for hyperdense vessel Skull: Negative Sinuses/Orbits: Extensive mucosal edema right sphenoid sinus unchanged. Mild mucosal edema maxillary sinus bilaterally. Bilateral cataract extraction. No orbital mass. Other: None ASPECTS ([place]berta Stroke Program Early CT Score) - Ganglionic level infarction (caudate, lentiform nuclei, internal capsule, insula, M1-M3 cortex): 7 - Supraganglionic infarction (M4-M6 cortex): 3 Total score (0-10 with 10 being normal): 10 IMPRESSION: 1. No acute intracranial abnormality 2. ASPECTS is 10 3. Code stroke imaging results were communicated on 07/27/2021 at 3:45 pm to provider Pearlean Brownie via text page Electronically Signed   By: Marlan Palau M.D.   On: 07/27/2021 15:46   VAS US CAROTID  Result Date: 07/29/2021 Carotid Arterial Duplex Study Patient Name:  KAMANI MAGNUSSEN  Date of Exam:   07/29/2021 Medical Rec #: [id number]         Accession #:  [id number] Date of Birth: 10/02/1930         Patient Gender: F Patient Age:   9 years Exam Location:  [facility] Procedure:      VAS US CAROTID Referring Phys: Terrilee Files [facility] --------------------------------------------------------------------------------  Indications:       CVA. Risk Factors:      Hypertension, hyperlipidemia. Comparison Study:  02/28/16 prior Performing Technologist: [place] Ponder RVS  Examination Guidelines: A complete evaluation includes B-mode imaging, spectral Doppler, color Doppler, and power Doppler as needed of all accessible portions of each vessel. Bilateral testing is considered an integral part of a complete examination. Limited examinations for reoccurring indications may be performed as noted.  Right Carotid Findings: +----------+--------+--------+--------+------------------+--------+           PSV  cm/sEDV cm/sStenosisPlaque DescriptionComments +----------+--------+--------+--------+------------------+--------+ CCA Prox  71      16              heterogenous               +----------+--------+--------+--------+------------------+--------+ CCA Distal63      15              heterogenous               +----------+--------+--------+--------+------------------+--------+ ICA Prox  61      8       1-39%   heterogenous               +----------+--------+--------+--------+------------------+--------+ ICA Distal59      19                                         +----------+--------+--------+--------+------------------+--------+ ECA       92      11                                         +----------+--------+--------+--------+------------------+--------+ +----------+--------+-------+--------+-------------------+           PSV cm/sEDV cmsDescribeArm Pressure (mmHG) +----------+--------+-------+--------+-------------------+ [id number]                                        +----------+--------+-------+--------+-------------------+ +---------+--------+--------+--------------+ VertebralPSV cm/sEDV cm/sNot identified +---------+--------+--------+--------------+  Left Carotid Findings: +----------+--------+--------+--------+------------------+--------+           PSV cm/sEDV cm/sStenosisPlaque DescriptionComments +----------+--------+--------+--------+------------------+--------+ CCA Prox  82                      heterogenous               +----------+--------+--------+--------+------------------+--------+ CCA Distal64      11              heterogenous               +----------+--------+--------+--------+------------------+--------+ ICA Prox  115     17      1-39%   heterogenous               +----------+--------+--------+--------+------------------+--------+ ICA Distal57      18                                          +----------+--------+--------+--------+------------------+--------+ ECA  124                                                +----------+--------+--------+--------+------------------+--------+ +----------+--------+--------+--------+-------------------+           PSV cm/sEDV cm/sDescribeArm Pressure (mmHG) +----------+--------+--------+--------+-------------------+ [id number]                                          +----------+--------+--------+--------+-------------------+ +---------+--------+--------+--------------+ VertebralPSV cm/sEDV cm/sNot identified +---------+--------+--------+--------------+   Summary: Right Carotid: Velocities in the right ICA are consistent with a 1-39% stenosis. Left Carotid: Velocities in the left ICA are consistent with a 1-39% stenosis. Vertebrals: Bilateral vertebral arteries were not visualized. *See table(s) above for measurements and observations.  Electronically signed by Delia Heady MD on 07/29/2021 at 5:17:54 PM.    Final     Assessment/Plan  1. Primary hypertension -  BPs stable, continue Norvas and Coreg  2. Cerebrovascular accident (CVA) due to stenosis of right cerebellar artery (HCC) -  stable, continue  Lovastatin,Plavix and ASA -  continue PT, and OT, for therapeutic strengthening exercises  3. DM (diabetes mellitus), type 2 with peripheral vascular complications [facility]) Lab Results  Component Value Date   HGBA1C 6.5 (H) 07/27/2021   -   diet-controlled     Family/ Hernandez Communication:   Discussed plan of care with resident and charge nurse.  Labs/tests ordered:  None  Goals of care:   Short-term care   Kenard Gower, DNP, MSN, FNP-BC [facility] [phone] (Monday-Friday 8:00 a.m. - 5:00 p.m.) [phone] (after hours)

## 2021-09-05 ENCOUNTER — Encounter: Payer: Self-pay | Admitting: Adult Health

## 2021-09-05 ENCOUNTER — Non-Acute Institutional Stay (SKILLED_NURSING_FACILITY): Payer: Medicare Other | Admitting: Adult Health

## 2021-09-05 DIAGNOSIS — Z8673 Personal history of transient ischemic attack (TIA), and cerebral infarction without residual deficits: Secondary | ICD-10-CM

## 2021-09-05 DIAGNOSIS — I129 Hypertensive chronic kidney disease with stage 1 through stage 4 chronic kidney disease, or unspecified chronic kidney disease: Secondary | ICD-10-CM | POA: Diagnosis not present

## 2021-09-05 DIAGNOSIS — D649 Anemia, unspecified: Secondary | ICD-10-CM

## 2021-09-05 DIAGNOSIS — E1151 Type 2 diabetes mellitus with diabetic peripheral angiopathy without gangrene: Secondary | ICD-10-CM

## 2021-09-05 NOTE — Progress Notes (Signed)
Location:  [facility] Room Number: 127-A Place of Service:  SNF (31) Provider:  Kenard Gower, DNP, FNP-BC  Patient Care Team: [name] as PCP - General (Family Medicine)  Extended Emergency Contact Information Primary Emergency Contact: [name] Height [place] Mobile Phone: [phone] Relation: Daughter Secondary Emergency Contact: parrot,edna Mobile Phone: [phone] Relation: Relative  Code Status: Full code   Goals of care: Advanced Directive information Advanced Directives 09/05/2021  Does Patient Have a Medical Advance Directive? No  Type of Advance Directive -  Would patient like information on creating a medical advance directive? No - Patient declined  Pre-existing out of facility DNR order (yellow form or pink MOST form) -     Chief Complaint  Patient presents with   Acute Visit    Short term rehabilitation     HPI:  Pt is a 87 y.o. Hernandez seen today for short-term rehabilitation visit. She continues to have PT, OT and ST. Latest hgbA1c 6.5, 07/27/21. She is not taking any medications for diabetes mellitus. Latest hgb/hct 10.6/31.9.  SBPs ranging from 108 to 137, with an outlier 93 and 150.  She takes Coreg 12.5 twice a day and Norvasc 2.5 mg in the afternoon.  Past Medical History:  Diagnosis Date   GERD (gastroesophageal reflux disease)    Hyperlipemia    Hypertension    TIA (transient ischemic attack)    Past Surgical History:  Procedure Laterality Date   ABDOMINAL HYSTERECTOMY     TONSILLECTOMY      Allergies  Allergen Reactions   Tape Other (See Comments)    SKIN IS THIN- WILL BRUISE AND TEAR EASILY!!   Sulfa Antibiotics Hives    Outpatient Encounter Medications as of 09/05/2021  Medication Sig   acetaminophen (TYLENOL) 650 MG CR tablet Take 1,300 mg by mouth every 8 (eight) hours.   amLODipine (NORVASC) 2.5 MG tablet Take 2.5 mg by mouth daily in the afternoon.   ASPERCREME  ARTHRITIS PAIN 1 % GEL Apply 2-4 g topically 4 (four) times daily as needed (for pain).   aspirin 81 MG chewable tablet Chew 81 mg by mouth daily at 3 pm.    BIOFREEZE 4 % GEL Apply 1 application topically 2 (two) times daily as needed (to painful areas).   bisacodyl (DULCOLAX) 10 MG suppository as needed. If not relieved by MOM, give 10 mg Bisacodyl suppositiory rectally X 1 dose in 24 hours as needed   carvedilol (COREG) 12.5 MG tablet Take 12.5 mg by mouth 2 (two) times daily with a meal.   clopidogrel (PLAVIX) 75 MG tablet Take 1 tablet (75 mg total) by mouth daily.   Lidocaine-Glycerin (PREPARATION H EX) Apply topically. Apply cream topically to external Hemorrhoids twice daily PRN for Irriation   lovastatin (MEVACOR) 20 MG tablet Take 20 mg by mouth daily after supper.   magnesium hydroxide (MILK OF MAGNESIA) 400 MG/5ML suspension If no BM in 3 days, give 30 cc Milk of Magnesium p.o. x 1 dose in 24 hours as needed (Do not use standing constipation orders for residents with renal failure CFR less than 30. Contact MD for orders) (Physician Order)   NON FORMULARY diet clarification - pureed Heart healthy diet   Nutritional Supplements (ENSURE PO) Take 237 mLs by mouth in the morning and at bedtime.   pantoprazole (PROTONIX) 40 MG tablet Take 1 tablet (40 mg total) by mouth daily.   sennosides-docusate sodium (SENOKOT-S) 8.6-50 MG tablet Take 2 tablets by mouth 2 (  two) times daily.   Sodium Phosphates (RA SALINE ENEMA RE) Place rectally. If not relieved by Biscodyl suppository, give disposable Saline Enema rectally X 1 dose/24 hrs as needed   No facility-administered encounter medications on file as of 09/05/2021.    Review of Systems  GENERAL: No change in appetite, no fatigue, no weight changes, no fever, chills or weakness MOUTH and THROAT: Denies oral discomfort, gingival pain or bleeding, pain from teeth or hoarseness   RESPIRATORY: no cough, SOB, DOE, wheezing, hemoptysis CARDIAC: No  chest pain, edema or palpitations GI: No abdominal pain, diarrhea, constipation, heart burn, nausea or vomiting GU: Denies dysuria, frequency, hematuria or discharge NEUROLOGICAL: Denies dizziness, syncope, numbness, or headache PSYCHIATRIC: Denies feelings of depression or anxiety. No report of hallucinations, insomnia, paranoia, or agitation    Immunization History  Administered Date(s) Administered   Influenza-Unspecified 08/27/2020   Pertinent  Health Maintenance Due  Topic Date Due   FOOT EXAM  Never done   OPHTHALMOLOGY EXAM  Never done   URINE MICROALBUMIN  Never done   DEXA SCAN  Never done   INFLUENZA VACCINE  06/27/2021   HEMOGLOBIN A1C  01/24/2022   No flowsheet data found.   Vitals:   09/05/21 1046  BP: (!) 108/59  Pulse: 70  Resp: 18  Temp: (!) 97.2 F (36.2 C)  Weight: 163 lb 9.6 oz (74.2 kg)  Height: 5\' 2"  (1.575 m)   Body mass index is 29.92 kg/m.  Physical Exam  GENERAL APPEARANCE: Well nourished. In no acute distress. Obese. SKIN:  Skin is warm and dry.  MOUTH and THROAT: Lips are without lesions. Oral mucosa is moist and without lesions.  RESPIRATORY: Breathing is even & unlabored, BS CTAB CARDIAC: RRR, no murmur,no extra heart sounds, no edema GI: Abdomen soft, normal BS, no masses, no tenderness NEUROLOGICAL: There is no tremor. Speech is clear. Alert and oriented X 3. PSYCHIATRIC:  Affect and behavior are appropriate  Labs reviewed: Recent Labs    07/23/21 0241 07/27/21 1530 07/27/21 1535 08/03/21 0000  NA 129* 133* 134* 135*  K 4.1 3.9 3.8 4.9  CL Jill* 103 102 96*  CO2 27 23  --  27*  GLUCOSE 111* 108* 103*  --   BUN 25* 33* 31* 25*  CREATININE 0.82 0.82 0.90 0.9  CALCIUM 10.3 8.2*  --  9.4   Recent Labs    07/23/21 0241 07/27/21 1530 08/03/21 0000  AST 17 15 14   ALT 14 12 8   ALKPHOS 75 57 75  BILITOT 0.9 0.6  --   PROT 7.0 6.1*  --   ALBUMIN 3.3* 2.9* 3.5   Recent Labs    07/23/21 0241 07/27/21 1530 07/27/21 1535  08/03/21 0000  WBC 8.6 7.6  --  7.8  NEUTROABS 5.5 5.2  --   --   HGB 11.5* 10.0* 10.2* 10.6*  HCT 36.6 32.9* 30.0* 32*  MCV 96.8 100.6*  --   --   PLT 270 264  --  269   Lab Results  Component Value Date   TSH 0.739 04/09/2019   Lab Results  Component Value Date   HGBA1C 6.5 (H) 07/27/2021   Lab Results  Component Value Date   CHOL 148 07/27/2021   HDL 45 07/27/2021   LDLCALC 62 07/27/2021   TRIG 205 (H) 07/27/2021   CHOLHDL 3.3 07/27/2021    Significant Diagnostic Results in last 30 days:  No results found.  Assessment/Plan  1. DM (diabetes mellitus), type 2 with peripheral  vascular complications [facility]) Lab Results  Component Value Date   HGBA1C 6.5 (H) 07/27/2021   -  diet-controlled  2. Normocytic anemia Lab Results  Component Value Date   WBC 7.8 08/03/2021   HGB 10.6 (A) 08/03/2021   HCT 32 (A) 08/03/2021   MCV 100.6 (H) 07/27/2021   PLT 269 08/03/2021   -  stable  3. Benign hypertension with chronic kidney disease -  BPs stable,   continue amlodipine and Coreg  4. History of CVA (cerebrovascular accident) -   Denies left upper extremity numbness, resolved -Stable, aspirin and lovastatin -Continue PT OT, for therapeutic strengthening exercises     Family/ staff Communication:   Discussed plan of care with resident and charge nurse.  Labs/tests ordered: None  Goals of care:   Short-term care   Kenard Gower, DNP, MSN, FNP-BC [facility] and [facility] [phone] (Monday-Friday 8:00 a.m. - 5:00 p.m.) [phone] (after hours)

## 2021-09-07 DIAGNOSIS — Z23 Encounter for immunization: Secondary | ICD-10-CM | POA: Diagnosis not present

## 2021-09-15 ENCOUNTER — Ambulatory Visit: Payer: Medicare Other | Admitting: Internal Medicine

## 2021-09-21 DIAGNOSIS — R2681 Unsteadiness on feet: Secondary | ICD-10-CM | POA: Diagnosis not present

## 2021-09-21 DIAGNOSIS — M6281 Muscle weakness (generalized): Secondary | ICD-10-CM | POA: Diagnosis not present

## 2021-09-22 DIAGNOSIS — M6281 Muscle weakness (generalized): Secondary | ICD-10-CM | POA: Diagnosis not present

## 2021-09-22 DIAGNOSIS — R2681 Unsteadiness on feet: Secondary | ICD-10-CM | POA: Diagnosis not present

## 2021-09-24 DIAGNOSIS — M6281 Muscle weakness (generalized): Secondary | ICD-10-CM | POA: Diagnosis not present

## 2021-09-24 DIAGNOSIS — R2681 Unsteadiness on feet: Secondary | ICD-10-CM | POA: Diagnosis not present

## 2021-09-26 DIAGNOSIS — M6281 Muscle weakness (generalized): Secondary | ICD-10-CM | POA: Diagnosis not present

## 2021-09-26 DIAGNOSIS — R2681 Unsteadiness on feet: Secondary | ICD-10-CM | POA: Diagnosis not present

## 2021-09-27 DIAGNOSIS — R2681 Unsteadiness on feet: Secondary | ICD-10-CM | POA: Diagnosis not present

## 2021-09-27 DIAGNOSIS — I639 Cerebral infarction, unspecified: Secondary | ICD-10-CM | POA: Diagnosis not present

## 2021-09-27 DIAGNOSIS — M6281 Muscle weakness (generalized): Secondary | ICD-10-CM | POA: Diagnosis not present

## 2021-09-28 DIAGNOSIS — R2681 Unsteadiness on feet: Secondary | ICD-10-CM | POA: Diagnosis not present

## 2021-09-28 DIAGNOSIS — M6281 Muscle weakness (generalized): Secondary | ICD-10-CM | POA: Diagnosis not present

## 2021-09-28 DIAGNOSIS — I639 Cerebral infarction, unspecified: Secondary | ICD-10-CM | POA: Diagnosis not present

## 2021-09-29 DIAGNOSIS — I639 Cerebral infarction, unspecified: Secondary | ICD-10-CM | POA: Diagnosis not present

## 2021-09-29 DIAGNOSIS — M6281 Muscle weakness (generalized): Secondary | ICD-10-CM | POA: Diagnosis not present

## 2021-09-29 DIAGNOSIS — R2681 Unsteadiness on feet: Secondary | ICD-10-CM | POA: Diagnosis not present

## 2021-09-30 ENCOUNTER — Non-Acute Institutional Stay (SKILLED_NURSING_FACILITY): Payer: Medicare Other | Admitting: Adult Health

## 2021-09-30 ENCOUNTER — Encounter: Payer: Self-pay | Admitting: Adult Health

## 2021-09-30 DIAGNOSIS — N1831 Chronic kidney disease, stage 3a: Secondary | ICD-10-CM

## 2021-09-30 DIAGNOSIS — I10 Essential (primary) hypertension: Secondary | ICD-10-CM

## 2021-09-30 DIAGNOSIS — I639 Cerebral infarction, unspecified: Secondary | ICD-10-CM | POA: Diagnosis not present

## 2021-09-30 DIAGNOSIS — M6281 Muscle weakness (generalized): Secondary | ICD-10-CM | POA: Diagnosis not present

## 2021-09-30 DIAGNOSIS — R2681 Unsteadiness on feet: Secondary | ICD-10-CM | POA: Diagnosis not present

## 2021-09-30 DIAGNOSIS — Z8673 Personal history of transient ischemic attack (TIA), and cerebral infarction without residual deficits: Secondary | ICD-10-CM

## 2021-09-30 DIAGNOSIS — D649 Anemia, unspecified: Secondary | ICD-10-CM | POA: Diagnosis not present

## 2021-09-30 NOTE — Progress Notes (Signed)
Location:  [facility] Room Number: 126-B Place of Service:  SNF (31) Provider:  Kenard Gower, DNP, FNP-BC  Patient Care Team: [name] as PCP - General (Family Medicine)  Extended Emergency Contact Information Primary Emergency Contact: [name] Height [place] Mobile Phone: [phone] Relation: Daughter Secondary Emergency Contact: parrot,edna Mobile Phone: [phone] Relation: Relative  Code Status: Full code   Goals of care: Advanced Directive information Advanced Directives 09/30/2021  Does Patient Have a Medical Advance Directive? No  Type of Advance Directive -  Would patient like information on creating a medical advance directive? No - Patient declined  Pre-existing out of facility DNR order (yellow form or pink MOST form) -     Chief Complaint  Patient presents with   Medical Management of Chronic Issues    Routine    HPI:  Pt is a 87 y.o. female seen today for medical management of chronic diseases. She is now a long-term care resident of [facility].. She has a PMH of hypertension, hyperlipidemia, GERD and multiple TIAs.  She was seen in the room today.  Latest GFR 56.85, taken 08/03/2021.  Latest hemoglobin 10.6, taken 08/03/2021.  SBPs ranging from 116-141, with outlier 159.  He takes Coreg 12.5 mg twice a day for hypertension.    Past Medical History:  Diagnosis Date   GERD (gastroesophageal reflux disease)    Hyperlipemia    Hypertension    TIA (transient ischemic attack)    Past Surgical History:  Procedure Laterality Date   ABDOMINAL HYSTERECTOMY     TONSILLECTOMY      Allergies  Allergen Reactions   Tape Other (See Comments)    SKIN IS THIN- WILL BRUISE AND TEAR EASILY!!   Sulfa Antibiotics Hives    Outpatient Encounter Medications as of 09/30/2021  Medication Sig   acetaminophen (TYLENOL) 650 MG CR tablet Take 1,300 mg by mouth every 8 (eight) hours.    amLODipine (NORVASC) 2.5 MG tablet Take 2.5 mg by mouth daily in the afternoon.   ASPERCREME ARTHRITIS PAIN 1 % GEL Apply 2-4 g topically 4 (four) times daily as needed (for pain).   aspirin 81 MG chewable tablet Chew 81 mg by mouth daily at 3 pm.    BIOFREEZE 4 % GEL Apply 1 application topically 2 (two) times daily as needed (to painful areas).   bisacodyl (DULCOLAX) 10 MG suppository as needed. If not relieved by MOM, give 10 mg Bisacodyl suppositiory rectally X 1 dose in 24 hours as needed   carvedilol (COREG) 12.5 MG tablet Take 12.5 mg by mouth 2 (two) times daily with a meal.   Lidocaine-Glycerin (PREPARATION H EX) Apply topically. Apply cream topically to external Hemorrhoids twice daily PRN for Irriation   Loperamide HCl (IMODIUM A-D PO) Take by mouth. 4 mg p.o. after 1st loose stool as needed.   lovastatin (MEVACOR) 20 MG tablet Take 20 mg by mouth daily after supper.   magnesium hydroxide (MILK OF MAGNESIA) 400 MG/5ML suspension If no BM in 3 days, give 30 cc Milk of Magnesium p.o. x 1 dose in 24 hours as needed (Do not use standing constipation orders for residents with renal failure CFR less than 30. Contact MD for orders) (Physician Order)   NON FORMULARY diet clarification - pureed Heart healthy diet   Nutritional Supplements (ENSURE PO) Take 237 mLs by mouth in the morning and at bedtime.   pantoprazole (PROTONIX) 40 MG tablet Take 1 tablet (40 mg total)  by mouth daily.   sennosides-docusate sodium (SENOKOT-S) 8.6-50 MG tablet Take 2 tablets by mouth 2 (two) times daily.   Sodium Phosphates (RA SALINE ENEMA RE) Place rectally. If not relieved by Biscodyl suppository, give disposable Saline Enema rectally X 1 dose/24 hrs as needed   [DISCONTINUED] clopidogrel (PLAVIX) 75 MG tablet Take 1 tablet (75 mg total) by mouth daily.   No facility-administered encounter medications on file as of 09/30/2021.    Review of Systems  GENERAL: No change in appetite,  no weight changes, no fever  or chills MOUTH and THROAT: Denies oral discomfort, gingival pain or bleeding RESPIRATORY: no cough, SOB, DOE, wheezing, hemoptysis CARDIAC: No chest pain, edema or palpitations GI: No abdominal pain, diarrhea, constipation, heart burn, nausea or vomiting GU: Denies dysuria, frequency, hematuria or discharge NEUROLOGICAL: Denies dizziness, syncope, numbness, or headache PSYCHIATRIC: Denies feelings of depression or anxiety. No report of hallucinations, insomnia, paranoia, or agitation   Immunization History  Administered Date(s) Administered   Influenza-Unspecified 08/27/2020   Pertinent  Health Maintenance Due  Topic Date Due   FOOT EXAM  Never done   OPHTHALMOLOGY EXAM  Never done   URINE MICROALBUMIN  Never done   DEXA SCAN  Never done   INFLUENZA VACCINE  06/27/2021   HEMOGLOBIN A1C  01/24/2022   Fall Risk 07/28/2021 07/28/2021 07/28/2021 07/29/2021 07/29/2021  Patient Fall Risk Level High fall risk High fall risk Moderate fall risk Moderate fall risk Moderate fall risk     Vitals:   09/30/21 1518  BP: 125/77  Pulse: 76  Resp: 20  Temp: (!) 97.5 F (36.4 C)  Weight: 167 lb 3.2 oz (75.8 kg)  Height: 5\' 2"  (1.575 m)   Body mass index is 30.58 kg/m.  Physical Exam  GENERAL APPEARANCE: Well nourished. In no acute distress. Obese. SKIN:  Skin is warm and dry.  MOUTH and THROAT: Lips are without lesions. Oral mucosa is moist and without lesions.  RESPIRATORY: Breathing is even & unlabored, BS CTAB CARDIAC: RRR, no murmur,no extra heart sounds, no edema GI: Abdomen soft, normal BS, no masses, no tenderness EXTREMITIES:  Able to move X 4 extremities NEUROLOGICAL: There is no tremor. Speech is clear. Alert and oriented X 3. PSYCHIATRIC:  Affect and behavior are appropriate  Labs reviewed: Recent Labs    07/23/21 0241 07/27/21 1530 07/27/21 1535 08/03/21 0000  NA 129* 133* 134* 135*  K 4.1 3.9 3.8 4.9  CL 91* 103 102 96*  CO2 27 23  --  27*  GLUCOSE 111* 108* 103*  --    BUN 25* 33* 31* 25*  CREATININE 0.82 0.82 0.90 0.9  CALCIUM 10.3 8.2*  --  9.4   Recent Labs    07/23/21 0241 07/27/21 1530 08/03/21 0000  AST 17 15 14   ALT 14 12 8   ALKPHOS 75 57 75  BILITOT 0.9 0.6  --   PROT 7.0 6.1*  --   ALBUMIN 3.3* 2.9* 3.5   Recent Labs    07/23/21 0241 07/27/21 1530 07/27/21 1535 08/03/21 0000  WBC 8.6 7.6  --  7.8  NEUTROABS 5.5 5.2  --   --   HGB 11.5* 10.0* 10.2* 10.6*  HCT 36.6 32.9* 30.0* 32*  MCV 96.8 100.6*  --   --   PLT 270 264  --  269   Lab Results  Component Value Date   TSH 0.739 04/09/2019   Lab Results  Component Value Date   HGBA1C 6.5 (H) 07/27/2021   Lab  Results  Component Value Date   CHOL 148 07/27/2021   HDL 45 07/27/2021   LDLCALC 62 07/27/2021   TRIG 205 (H) 07/27/2021   CHOLHDL 3.3 07/27/2021    Significant Diagnostic Results in last 30 days:  No results found.  Assessment/Plan  1. Stage 3a chronic kidney disease (HCC) Lab Results  Component Value Date   NA 135 (A) 08/03/2021   K 4.9 08/03/2021   CO2 27 (A) 08/03/2021   GLUCOSE 103 (H) 07/27/2021   BUN 25 (A) 08/03/2021   CREATININE 0.9 08/03/2021   CALCIUM 9.4 08/03/2021   GFRNONAA 56.85 08/03/2021    -  stable  2. Normocytic anemia CBC Latest Ref Rng & Units 08/03/2021 07/27/2021 07/27/2021  WBC - 7.8 - 7.6  Hemoglobin 12.0 - 16.0 10.6(A) 10.2(L) 10.0(L)  Hematocrit 36 - 46 32(A) 30.0(L) 32.9(L)  Platelets 150 - 399 269 - 264   -  stable   3. Primary hypertension -  BPs stable, continue  Coreg 12.5 mg BID -  monitor BPs  4. History of CVA (cerebrovascular accident) -  stable, continue ASA 81 mg daily and Lovastatin 20 mg at supper    Family/ staff Communication:   Discussed plan of care with resident and charge nurse.  Labs/tests ordered:  None  Goals of care:   Long-term care   Durenda Age, DNP, MSN, FNP-BC [facility] and [facility] [phone] (Monday-Friday 8:00 a.m. - 5:00 p.m.) [phone] (after  hours)

## 2021-10-03 DIAGNOSIS — M6281 Muscle weakness (generalized): Secondary | ICD-10-CM | POA: Diagnosis not present

## 2021-10-03 DIAGNOSIS — R2681 Unsteadiness on feet: Secondary | ICD-10-CM | POA: Diagnosis not present

## 2021-10-03 DIAGNOSIS — I639 Cerebral infarction, unspecified: Secondary | ICD-10-CM | POA: Diagnosis not present

## 2021-10-04 DIAGNOSIS — I639 Cerebral infarction, unspecified: Secondary | ICD-10-CM | POA: Diagnosis not present

## 2021-10-04 DIAGNOSIS — R2681 Unsteadiness on feet: Secondary | ICD-10-CM | POA: Diagnosis not present

## 2021-10-04 DIAGNOSIS — M6281 Muscle weakness (generalized): Secondary | ICD-10-CM | POA: Diagnosis not present

## 2021-10-05 DIAGNOSIS — M6281 Muscle weakness (generalized): Secondary | ICD-10-CM | POA: Diagnosis not present

## 2021-10-05 DIAGNOSIS — R2681 Unsteadiness on feet: Secondary | ICD-10-CM | POA: Diagnosis not present

## 2021-10-05 DIAGNOSIS — I639 Cerebral infarction, unspecified: Secondary | ICD-10-CM | POA: Diagnosis not present

## 2021-10-06 DIAGNOSIS — I639 Cerebral infarction, unspecified: Secondary | ICD-10-CM | POA: Diagnosis not present

## 2021-10-06 DIAGNOSIS — R2681 Unsteadiness on feet: Secondary | ICD-10-CM | POA: Diagnosis not present

## 2021-10-06 DIAGNOSIS — M6281 Muscle weakness (generalized): Secondary | ICD-10-CM | POA: Diagnosis not present

## 2021-10-09 DIAGNOSIS — M6281 Muscle weakness (generalized): Secondary | ICD-10-CM | POA: Diagnosis not present

## 2021-10-09 DIAGNOSIS — I639 Cerebral infarction, unspecified: Secondary | ICD-10-CM | POA: Diagnosis not present

## 2021-10-09 DIAGNOSIS — R2681 Unsteadiness on feet: Secondary | ICD-10-CM | POA: Diagnosis not present

## 2021-10-10 DIAGNOSIS — R2681 Unsteadiness on feet: Secondary | ICD-10-CM | POA: Diagnosis not present

## 2021-10-10 DIAGNOSIS — M6281 Muscle weakness (generalized): Secondary | ICD-10-CM | POA: Diagnosis not present

## 2021-10-10 DIAGNOSIS — I639 Cerebral infarction, unspecified: Secondary | ICD-10-CM | POA: Diagnosis not present

## 2021-10-11 DIAGNOSIS — M6281 Muscle weakness (generalized): Secondary | ICD-10-CM | POA: Diagnosis not present

## 2021-10-11 DIAGNOSIS — R2681 Unsteadiness on feet: Secondary | ICD-10-CM | POA: Diagnosis not present

## 2021-10-11 DIAGNOSIS — I639 Cerebral infarction, unspecified: Secondary | ICD-10-CM | POA: Diagnosis not present

## 2021-10-12 DIAGNOSIS — R2681 Unsteadiness on feet: Secondary | ICD-10-CM | POA: Diagnosis not present

## 2021-10-12 DIAGNOSIS — I639 Cerebral infarction, unspecified: Secondary | ICD-10-CM | POA: Diagnosis not present

## 2021-10-12 DIAGNOSIS — M6281 Muscle weakness (generalized): Secondary | ICD-10-CM | POA: Diagnosis not present

## 2021-10-13 DIAGNOSIS — R2681 Unsteadiness on feet: Secondary | ICD-10-CM | POA: Diagnosis not present

## 2021-10-13 DIAGNOSIS — I639 Cerebral infarction, unspecified: Secondary | ICD-10-CM | POA: Diagnosis not present

## 2021-10-13 DIAGNOSIS — M6281 Muscle weakness (generalized): Secondary | ICD-10-CM | POA: Diagnosis not present

## 2021-10-14 DIAGNOSIS — M6281 Muscle weakness (generalized): Secondary | ICD-10-CM | POA: Diagnosis not present

## 2021-10-14 DIAGNOSIS — R2681 Unsteadiness on feet: Secondary | ICD-10-CM | POA: Diagnosis not present

## 2021-10-14 DIAGNOSIS — I639 Cerebral infarction, unspecified: Secondary | ICD-10-CM | POA: Diagnosis not present

## 2021-10-15 DIAGNOSIS — I639 Cerebral infarction, unspecified: Secondary | ICD-10-CM | POA: Diagnosis not present

## 2021-10-15 DIAGNOSIS — M6281 Muscle weakness (generalized): Secondary | ICD-10-CM | POA: Diagnosis not present

## 2021-10-15 DIAGNOSIS — R2681 Unsteadiness on feet: Secondary | ICD-10-CM | POA: Diagnosis not present

## 2021-10-17 DIAGNOSIS — R2681 Unsteadiness on feet: Secondary | ICD-10-CM | POA: Diagnosis not present

## 2021-10-17 DIAGNOSIS — I639 Cerebral infarction, unspecified: Secondary | ICD-10-CM | POA: Diagnosis not present

## 2021-10-17 DIAGNOSIS — M6281 Muscle weakness (generalized): Secondary | ICD-10-CM | POA: Diagnosis not present

## 2021-10-19 DIAGNOSIS — M6281 Muscle weakness (generalized): Secondary | ICD-10-CM | POA: Diagnosis not present

## 2021-10-19 DIAGNOSIS — R2681 Unsteadiness on feet: Secondary | ICD-10-CM | POA: Diagnosis not present

## 2021-10-19 DIAGNOSIS — I639 Cerebral infarction, unspecified: Secondary | ICD-10-CM | POA: Diagnosis not present

## 2021-10-22 DIAGNOSIS — M6281 Muscle weakness (generalized): Secondary | ICD-10-CM | POA: Diagnosis not present

## 2021-10-22 DIAGNOSIS — I639 Cerebral infarction, unspecified: Secondary | ICD-10-CM | POA: Diagnosis not present

## 2021-10-22 DIAGNOSIS — R2681 Unsteadiness on feet: Secondary | ICD-10-CM | POA: Diagnosis not present

## 2021-10-24 DIAGNOSIS — R2681 Unsteadiness on feet: Secondary | ICD-10-CM | POA: Diagnosis not present

## 2021-10-24 DIAGNOSIS — M6281 Muscle weakness (generalized): Secondary | ICD-10-CM | POA: Diagnosis not present

## 2021-10-24 DIAGNOSIS — I639 Cerebral infarction, unspecified: Secondary | ICD-10-CM | POA: Diagnosis not present

## 2021-10-25 DIAGNOSIS — R2681 Unsteadiness on feet: Secondary | ICD-10-CM | POA: Diagnosis not present

## 2021-10-25 DIAGNOSIS — I639 Cerebral infarction, unspecified: Secondary | ICD-10-CM | POA: Diagnosis not present

## 2021-10-25 DIAGNOSIS — M6281 Muscle weakness (generalized): Secondary | ICD-10-CM | POA: Diagnosis not present

## 2021-10-26 ENCOUNTER — Encounter: Payer: Self-pay | Admitting: Neurology

## 2021-10-26 ENCOUNTER — Ambulatory Visit: Payer: Medicare Other | Admitting: Neurology

## 2021-10-26 DIAGNOSIS — M6281 Muscle weakness (generalized): Secondary | ICD-10-CM | POA: Diagnosis not present

## 2021-10-26 DIAGNOSIS — R2681 Unsteadiness on feet: Secondary | ICD-10-CM | POA: Diagnosis not present

## 2021-10-26 DIAGNOSIS — I639 Cerebral infarction, unspecified: Secondary | ICD-10-CM | POA: Diagnosis not present

## 2021-10-27 DIAGNOSIS — I639 Cerebral infarction, unspecified: Secondary | ICD-10-CM | POA: Diagnosis not present

## 2021-10-27 DIAGNOSIS — M6281 Muscle weakness (generalized): Secondary | ICD-10-CM | POA: Diagnosis not present

## 2021-10-27 DIAGNOSIS — R2681 Unsteadiness on feet: Secondary | ICD-10-CM | POA: Diagnosis not present

## 2021-10-28 DIAGNOSIS — I639 Cerebral infarction, unspecified: Secondary | ICD-10-CM | POA: Diagnosis not present

## 2021-10-28 DIAGNOSIS — R2681 Unsteadiness on feet: Secondary | ICD-10-CM | POA: Diagnosis not present

## 2021-10-28 DIAGNOSIS — M6281 Muscle weakness (generalized): Secondary | ICD-10-CM | POA: Diagnosis not present

## 2021-10-31 DIAGNOSIS — R2681 Unsteadiness on feet: Secondary | ICD-10-CM | POA: Diagnosis not present

## 2021-10-31 DIAGNOSIS — I639 Cerebral infarction, unspecified: Secondary | ICD-10-CM | POA: Diagnosis not present

## 2021-10-31 DIAGNOSIS — M6281 Muscle weakness (generalized): Secondary | ICD-10-CM | POA: Diagnosis not present

## 2021-11-01 ENCOUNTER — Non-Acute Institutional Stay (SKILLED_NURSING_FACILITY): Payer: Medicare Other | Admitting: Internal Medicine

## 2021-11-01 ENCOUNTER — Encounter: Payer: Self-pay | Admitting: Internal Medicine

## 2021-11-01 DIAGNOSIS — Z8673 Personal history of transient ischemic attack (TIA), and cerebral infarction without residual deficits: Secondary | ICD-10-CM

## 2021-11-01 DIAGNOSIS — I10 Essential (primary) hypertension: Secondary | ICD-10-CM | POA: Diagnosis not present

## 2021-11-01 DIAGNOSIS — E1151 Type 2 diabetes mellitus with diabetic peripheral angiopathy without gangrene: Secondary | ICD-10-CM | POA: Diagnosis not present

## 2021-11-01 DIAGNOSIS — I639 Cerebral infarction, unspecified: Secondary | ICD-10-CM | POA: Diagnosis not present

## 2021-11-01 DIAGNOSIS — E441 Mild protein-calorie malnutrition: Secondary | ICD-10-CM

## 2021-11-01 DIAGNOSIS — R2681 Unsteadiness on feet: Secondary | ICD-10-CM | POA: Diagnosis not present

## 2021-11-01 DIAGNOSIS — E871 Hypo-osmolality and hyponatremia: Secondary | ICD-10-CM

## 2021-11-01 DIAGNOSIS — M6281 Muscle weakness (generalized): Secondary | ICD-10-CM | POA: Diagnosis not present

## 2021-11-01 DIAGNOSIS — R5383 Other fatigue: Secondary | ICD-10-CM | POA: Diagnosis not present

## 2021-11-01 DIAGNOSIS — E46 Unspecified protein-calorie malnutrition: Secondary | ICD-10-CM | POA: Insufficient documentation

## 2021-11-01 NOTE — Assessment & Plan Note (Addendum)
She remains on aspirin prophylaxis alone.She denies numbness & tingling at present.  No GI symptoms or bleeding dyscrasias reported on  low dose ASA. Copy [facility] as to any needed follow up.

## 2021-11-01 NOTE — Progress Notes (Signed)
NURSING HOME LOCATION:  [facility] ROOM NUMBER:    CODE STATUS:  Full Code  PCP:  Jarrett Soho PA-C  This is a nursing facility follow up visit of chronic medical diagnoses & to document compliance with Regulation 483.30 (c) in The Long Term Care Survey Manual Phase 2 which mandates caregiver visit ( visits can alternate among physician, PA or NP as per statutes) within 10 days of 30 days / 60 days/ 90 days post admission to SNF date    Interim medical record and care since last SNF visit was updated with review of diagnostic studies and change in clinical status since last visit were documented.  HPI: She has been a resident of this facility since her hospitalization 8/31 - 07/29/2021 for LUE numbness.  MRI revealed subacute infarcts in the right frontal lobe.  Moderate-severe spinal cord stenosis at C2-C3 was present on MRI of the C-spine with mild mass-effect on the cord but without evidence of cord signal abnormality.  There was moderate spinal canal stenosis at C5-C6 and C6-C7.  Neurosurgery did not recommend surgical decompression . Neurology recommended 3 weeks of dual therapy with Plavix and aspirin followed by aspirin alone.  An incidental finding was a left ICA paraophthalmic aneurysm which was to be monitored post discharge.  Neurology follow-up with [facility] was scheduled for 11/30; I see no visit documentation. Other medical issues include GERD, dyslipidemia, essential hypertension, and history of TIA.  She is a former smoker. The most recent labs were performed here at the SNF and revealed mild hyponatremia with a sodium of 135 and minimal azotemia with a BUN of 25.  Creatinine was 0.9 and GFR 56.85 indicating CKD stage IIIa. Review of serial labs indicates that her total protein was 6.1 on 07/27/2021.  Albumin was 2.9 at that time.  On 9/7 albumin had improved to 3.5.  Total protein has not been repeated.  Most recent A1c was on 8/31 revealing a  well-controlled value of 6.5%.  She is on no diabetic therapy.  Review of systems: She denies any numbness in any extremities despite history as noted above.  She states that she is doing "fair".  She states that she has no energy some days.  She also describes chronic constipation and chronic indigestion.  To prevent dysphagia all food is pured and she eats it with applesauce.  She describes ongoing joint discomfort in her knees and ankles.  She describes polyuria but no polydipsia or polyphagia.  Constitutional: No fever, significant weight change  Eyes: No redness, discharge, pain, vision change ENT/mouth: No nasal congestion,  purulent discharge, earache, change in hearing, sore throat  Cardiovascular: No chest pain, palpitations, paroxysmal nocturnal dyspnea, claudication, edema  Respiratory: No cough, sputum production, hemoptysis, DOE, significant snoring, apnea   Gastrointestinal: No abdominal pain, nausea /vomiting, rectal bleeding, melena Genitourinary: No dysuria, hematuria, pyuria Dermatologic: No rash, pruritus, change in appearance of skin Neurologic: No dizziness, headache, syncope, seizures Psychiatric: No significant anxiety, depression, insomnia, anorexia Endocrine: No change in hair/skin/nails Hematologic/lymphatic: No significant bruising, lymphadenopathy, abnormal bleeding Allergy/immunology: No itchy/watery eyes, significant sneezing, urticaria, angioedema  Physical exam:  Pertinent or positive findings: She appears her stated age.  Hair is well groomed.  Ptosis is present more on the right than the left.  She is not wearing any dental plates.  The corner of the mouth sags on the left.  There is accentuation of the upper thoracic curvature.  Abdomen is protuberant.  She has nonpitting  edema of the feet and ankles.  Pedal pulses are decreased but palpable.  There is a shallow ulcer over the right shin with eschar.  She has multiple small venous nevi over the shins.  There is  a cyst at the medial base of the left thumb.  Fusiform changes of the knees are present.  The distal shins and ankles are tender to palpation.  General appearance: Adequately nourished; no acute distress, increased work of breathing is present.   Lymphatic: No lymphadenopathy about the head, neck, axilla. Eyes: No conjunctival inflammation or lid edema is present. There is no scleral icterus. Ears:  External ear exam shows no significant lesions or deformities.   Nose:  External nasal examination shows no deformity or inflammation. Nasal mucosa are pink and moist without lesions, exudates Oral exam:  Lips and gums are healthy appearing. There is no oropharyngeal erythema or exudate. Neck:  No thyromegaly, masses, tenderness noted.    Heart:  Normal rate and regular rhythm. S1 and S2 normal without gallop, murmur, click, rub .  Lungs: Chest clear to auscultation without wheezes, rhonchi, rales, rubs. Abdomen: Bowel sounds are normal. Abdomen is soft and nontender with no organomegaly, hernias, masses. GU: Deferred  Extremities:  No cyanosis, clubbing  Neurologic exam :Balance, Rhomberg, finger to nose testing could not be completed due to clinical state Skin: Warm & dry w/o tenting.  See summary under each active problem in the Problem List with associated updated therapeutic plan

## 2021-11-01 NOTE — Assessment & Plan Note (Signed)
On 07/27/2021 total protein was 6.1 and albumin 2.9.  On 9/7 albumin improved to 3.5.  Total protein has not been rechecked but can be performed with her A1c follow-up.

## 2021-11-01 NOTE — Assessment & Plan Note (Addendum)
Periodic monitoring of renal function indicated because of hyponatremia &  need for dual antihypertensive therapy

## 2021-11-01 NOTE — Assessment & Plan Note (Addendum)
She is on a calcium channel blocker( amlodipine) and carvedilol.  Blood pressures were reviewed and ranged from 110-129/52-65.  No change indicated unless there is progressive edema or she is having postural hypotension symptoms.

## 2021-11-01 NOTE — Assessment & Plan Note (Addendum)
Serially anemia has been stable; it will be updated because of fatigue. B12 level if indicesstill  macrocytic

## 2021-11-01 NOTE — Patient Instructions (Signed)
See assessment and plan under each diagnosis in the problem list and acutely for this visit 

## 2021-11-01 NOTE — Assessment & Plan Note (Signed)
Update of A1c indicated; she is on no diabetic therapy at this time.

## 2021-11-01 NOTE — Assessment & Plan Note (Signed)
Update TSH and CBC.

## 2021-11-02 DIAGNOSIS — E559 Vitamin D deficiency, unspecified: Secondary | ICD-10-CM | POA: Diagnosis not present

## 2021-11-02 DIAGNOSIS — D519 Vitamin B12 deficiency anemia, unspecified: Secondary | ICD-10-CM | POA: Diagnosis not present

## 2021-11-02 DIAGNOSIS — I10 Essential (primary) hypertension: Secondary | ICD-10-CM | POA: Diagnosis not present

## 2021-11-02 LAB — BASIC METABOLIC PANEL
BUN: 29 — AB (ref 4–21)
CO2: 22 (ref 13–22)
Chloride: 97 — AB (ref 99–108)
Creatinine: 1 (ref 0.5–1.1)
Glucose: 99
Potassium: 4.7 (ref 3.4–5.3)
Sodium: 140 (ref 137–147)

## 2021-11-02 LAB — TSH: TSH: 2.75 (ref 0.41–5.90)

## 2021-11-02 LAB — HEPATIC FUNCTION PANEL
ALT: 8 (ref 7–35)
AST: 12 — AB (ref 13–35)
Alkaline Phosphatase: 78 (ref 25–125)

## 2021-11-02 LAB — CBC AND DIFFERENTIAL
HCT: 34 — AB (ref 36–46)
Hemoglobin: 10.5 — AB (ref 12.0–16.0)
Platelets: 287 (ref 150–399)
WBC: 7.7

## 2021-11-02 LAB — COMPREHENSIVE METABOLIC PANEL
Albumin: 3.5 (ref 3.5–5.0)
Calcium: 9.7 (ref 8.7–10.7)
GFR calc Af Amer: 60.84
GFR calc non Af Amer: 52.49
Globulin: 2.5

## 2021-11-02 LAB — CBC: RBC: 3.52 — AB (ref 3.87–5.11)

## 2021-11-02 LAB — VITAMIN B12: Vitamin B-12: 454

## 2021-11-09 ENCOUNTER — Non-Acute Institutional Stay (SKILLED_NURSING_FACILITY): Payer: Medicare Other | Admitting: Adult Health

## 2021-11-09 ENCOUNTER — Encounter: Payer: Self-pay | Admitting: Adult Health

## 2021-11-09 DIAGNOSIS — Z7189 Other specified counseling: Secondary | ICD-10-CM

## 2021-11-09 DIAGNOSIS — D649 Anemia, unspecified: Secondary | ICD-10-CM

## 2021-11-09 DIAGNOSIS — I10 Essential (primary) hypertension: Secondary | ICD-10-CM | POA: Diagnosis not present

## 2021-11-09 DIAGNOSIS — Z8673 Personal history of transient ischemic attack (TIA), and cerebral infarction without residual deficits: Secondary | ICD-10-CM

## 2021-11-09 NOTE — Progress Notes (Signed)
Location:  [facility] Room Number: 126 B Place of Service:  SNF (31) Provider:  Kenard Gower, DNP, FNP-BC  Patient Care Team: [name] as PCP - General (Family Medicine)  Extended Emergency Contact Information Primary Emergency Contact: [name] Height [place] Mobile Phone: [phone] Relation: Daughter Secondary Emergency Contact: parrot,edna Mobile Phone: [phone] Relation: Relative  Code Status:  FULL CODE  Goals of care: Advanced Directive information Advanced Directives 11/09/2021  Does Patient Have a Medical Advance Directive? No  Type of Advance Directive -  Would patient like information on creating a medical advance directive? No - Patient declined  Pre-existing out of facility DNR order (yellow form or pink MOST form) -     Chief Complaint  Patient presents with   Acute Visit    Acute visit for Care plan meeting    HPI:  Pt is a 87 y.o. female seen today for care plan meeting. She is a long-term care resident of [facility]. The meeting was attended by resident and her daughter, NP and Child psychotherapist. She remains to be full code. Discussed medications, vital signs an weights. She sometimes refuses to participate in restorative exercises. Latest weight is 174 lbs, stable. Resident and daughter gave consent for COVID-19 booster. Resident has been stable for the past 3 months. The daughter's and resident's queries regarding care were answered. The meeting lasted for 15 minutes.   Past Medical History:  Diagnosis Date   GERD (gastroesophageal reflux disease)    Hyperlipemia    Hypertension    TIA (transient ischemic attack)    Past Surgical History:  Procedure Laterality Date   ABDOMINAL HYSTERECTOMY     TONSILLECTOMY      Allergies  Allergen Reactions   Tape Other (See Comments)    SKIN IS THIN- WILL BRUISE AND TEAR EASILY!!   Sulfa Antibiotics Hives     Outpatient Encounter Medications as of 11/09/2021  Medication Sig   acetaminophen (TYLENOL) 650 MG CR tablet Take 1,300 mg by mouth every 8 (eight) hours.   acetaminophen (TYLENOL) 650 MG CR tablet Take 650 mg by mouth 2 (two) times daily as needed for pain.   ASPERCREME ARTHRITIS PAIN 1 % GEL Apply 2-4 g topically 4 (four) times daily as needed (for pain).   aspirin 81 MG chewable tablet Chew 81 mg by mouth daily at 3 pm.    BIOFREEZE 4 % GEL Apply 1 application topically 2 (two) times daily as needed (to painful areas).   bisacodyl (DULCOLAX) 10 MG suppository as needed. If not relieved by MOM, give 10 mg Bisacodyl suppositiory rectally X 1 dose in 24 hours as needed   carvedilol (COREG) 12.5 MG tablet Take 12.5 mg by mouth 2 (two) times daily with a meal.   Lidocaine-Glycerin (PREPARATION H EX) Apply topically. Apply cream topically to external Hemorrhoids twice daily PRN for Irriation   Loperamide HCl (IMODIUM A-D PO) Take by mouth. 4 mg p.o. after 1st loose stool as needed.   lovastatin (MEVACOR) 20 MG tablet Take 20 mg by mouth daily after supper.   magnesium hydroxide (MILK OF MAGNESIA) 400 MG/5ML suspension If no BM in 3 days, give 30 cc Milk of Magnesium p.o. x 1 dose in 24 hours as needed (Do not use standing constipation orders for residents with renal failure CFR less than 30. Contact MD for orders) (Physician Order)   NON FORMULARY diet clarification - pureed Heart healthy diet   Nutritional Supplements (ENSURE  PO) Take 237 mLs by mouth in the morning and at bedtime.   pantoprazole (PROTONIX) 40 MG tablet Take 1 tablet (40 mg total) by mouth daily.   polyethylene glycol (MIRALAX / GLYCOLAX) 17 g packet Take 17 g by mouth daily. 4-8 OZ OF WATER AND DRINK BY MOUTH ONCE DAILY AS NEEDED FOR CONSTIPATION   sennosides-docusate sodium (SENOKOT-S) 8.6-50 MG tablet Take 2 tablets by mouth 2 (two) times daily.   Sodium Phosphates (RA SALINE ENEMA RE) Place rectally. If not relieved by  Biscodyl suppository, give disposable Saline Enema rectally X 1 dose/24 hrs as needed   [DISCONTINUED] amLODipine (NORVASC) 2.5 MG tablet Take 2.5 mg by mouth daily in the afternoon.   No facility-administered encounter medications on file as of 11/09/2021.    Review of Systems  GENERAL: No change in appetite, no fatigue, no weight changes, no fever or chills  MOUTH and THROAT: Denies oral discomfort, gingival pain or bleeding RESPIRATORY: no cough, SOB, DOE, wheezing, hemoptysis CARDIAC: No chest pain, edema or palpitations GI: No abdominal pain, diarrhea, constipation, heart burn, nausea or vomiting GU: Denies dysuria, frequency, hematuria or discharge NEUROLOGICAL: Denies dizziness, syncope, numbness, or headache PSYCHIATRIC: Denies feelings of depression or anxiety. No report of hallucinations, insomnia, paranoia, or agitation   Immunization History  Administered Date(s) Administered   Influenza, High Dose Seasonal PF 08/27/2018   Influenza-Unspecified 08/27/2020   Pertinent  Health Maintenance Due  Topic Date Due   FOOT EXAM  Never done   OPHTHALMOLOGY EXAM  Never done   URINE MICROALBUMIN  Never done   DEXA SCAN  Never done   INFLUENZA VACCINE  06/27/2021   HEMOGLOBIN A1C  01/24/2022   Fall Risk 07/28/2021 07/28/2021 07/28/2021 07/29/2021 07/29/2021  Patient Fall Risk Level High fall risk High fall risk Moderate fall risk Moderate fall risk Moderate fall risk     Vitals:   11/09/21 1411  BP: 126/65  Pulse: 67  Resp: 20  Temp: 98.4 F (36.9 C)  Weight: 174 lb (78.9 kg)   Body mass index is 31.83 kg/m.  Physical Exam  GENERAL APPEARANCE: Well nourished. In no acute distress. Obese. SKIN:  Skin is warm and dry.  MOUTH and THROAT: Lips are without lesions. Oral mucosa is moist and without lesions. Tongue is normal in shape, size, and color and without lesions RESPIRATORY: Breathing is even & unlabored, BS CTAB CARDIAC: RRR, no murmur,no extra heart sounds, no edema GI:  Abdomen soft, normal BS, no masses, no tenderness, NEUROLOGICAL: There is no tremor. Speech is clear. Alert and oriented X 3. PSYCHIATRIC:  Affect and behavior are appropriate  Labs reviewed: Recent Labs    07/23/21 0241 07/27/21 1530 07/27/21 1535 08/03/21 0000 11/02/21 0000  NA 129* 133* 134* 135* 140  K 4.1 3.9 3.8 4.9 4.7  CL 91* 103 102 96* 97*  CO2 27 23  --  27* 22  GLUCOSE 111* 108* 103*  --   --   BUN 25* 33* 31* 25* 29*  CREATININE 0.82 0.82 0.90 0.9 1.0  CALCIUM 10.3 8.2*  --  9.4 9.7   Recent Labs    07/23/21 0241 07/27/21 1530 08/03/21 0000 11/02/21 0000  AST 17 15 14  12*  ALT 14 12 8 8   ALKPHOS 75 57 75 78  BILITOT 0.9 0.6  --   --   PROT 7.0 6.1*  --   --   ALBUMIN 3.3* 2.9* 3.5 3.5   Recent Labs    07/23/21 0241 07/27/21 1530  07/27/21 1535 08/03/21 0000 11/02/21 0000  WBC 8.6 7.6  --  7.8 7.7  NEUTROABS 5.5 5.2  --   --   --   HGB 11.5* 10.0* 10.2* 10.6* 10.5*  HCT 36.6 32.9* 30.0* 32* 34*  MCV 96.8 100.6*  --   --   --   PLT 270 264  --  269 287   Lab Results  Component Value Date   TSH 2.75 11/02/2021   Lab Results  Component Value Date   HGBA1C 6.5 (H) 07/27/2021   Lab Results  Component Value Date   CHOL 148 07/27/2021   HDL 45 07/27/2021   LDLCALC 62 07/27/2021   TRIG 205 (H) 07/27/2021   CHOLHDL 3.3 07/27/2021    Significant Diagnostic Results in last 30 days:  No results found.  Assessment/Plan  1. ACP (advance care planning) -   remains to be full code -   discussed medications, vital signs and weights  2. History of CVA (cerebrovascular accident) -  stable, continue Asa and Lovastatin  3. Primary hypertension -  BPs stable, continue Coreg  4. Normocytic anemia Lab Results  Component Value Date   HGB 10.5 (A) 11/02/2021    -   stable    Family/ staff Communication:   Discussed plan of care with resident, daughter and IDT  Labs/tests ordered:   None  Goals of care:   Long-term care   Durenda Age, DNP, MSN, FNP-BC [facility] [phone] (Monday-Friday 8:00 a.m. - 5:00 p.m.) [phone] (after hours)

## 2021-11-23 DIAGNOSIS — E119 Type 2 diabetes mellitus without complications: Secondary | ICD-10-CM | POA: Diagnosis not present

## 2021-11-23 LAB — HEMOGLOBIN A1C: Hemoglobin A1C: 5.8

## 2021-11-23 LAB — LIPID PANEL
Cholesterol: 140 (ref 0–200)
HDL: 44 (ref 35–70)
LDL Cholesterol: 68
Triglycerides: 139 (ref 40–160)

## 2021-12-01 ENCOUNTER — Non-Acute Institutional Stay (SKILLED_NURSING_FACILITY): Payer: Medicare Other | Admitting: Adult Health

## 2021-12-01 ENCOUNTER — Encounter: Payer: Self-pay | Admitting: Adult Health

## 2021-12-01 DIAGNOSIS — E1151 Type 2 diabetes mellitus with diabetic peripheral angiopathy without gangrene: Secondary | ICD-10-CM

## 2021-12-01 DIAGNOSIS — D638 Anemia in other chronic diseases classified elsewhere: Secondary | ICD-10-CM

## 2021-12-01 DIAGNOSIS — I10 Essential (primary) hypertension: Secondary | ICD-10-CM | POA: Diagnosis not present

## 2021-12-01 DIAGNOSIS — Z8673 Personal history of transient ischemic attack (TIA), and cerebral infarction without residual deficits: Secondary | ICD-10-CM

## 2021-12-01 NOTE — Progress Notes (Signed)
Location:  [facility] Room Number: 126 B Place of Service:  SNF (31) Provider:  Kenard GowerMedina-Vargas, Slyvia Lartigue, DNP, FNP-BC  Patient Care Team: Loreen FreudWharton, Courtney, PA-C as PCP - General (Family Medicine)  Extended Emergency Contact Information Primary Emergency Contact: [name] HeightEdwards,Sharon  [place] Mobile Phone: [phone] Relation: Daughter Secondary Emergency Contact: parrot,edna Mobile Phone: [phone] Relation: Relative  Code Status:  Full Code  Goals of care: Advanced Directive information Advanced Directives 11/09/2021  Does Patient Have a Medical Advance Directive? No  Type of Advance Directive -  Would patient like information on creating a medical advance directive? No - Patient declined  Pre-existing out of facility DNR order (yellow form or pink MOST form) -     Chief Complaint  Patient presents with   Medical Management of Chronic Issues    Routine Visit    HPI:  Pt is a 88 y.o. female seen today for medical management of chronic diseases. She is a long-term care resident of [facility]. She has a PMH of hypertension, hyperlipidemia, GERD and multiple TIAs.  Primary hypertension  -  SBPs ranging from 180-133, with outlier 140.  She takes Coreg 12.5 mg twice a day.  History of CVA (cerebrovascular accident) -stable, takes aspirin 81 mg daily and lovastatin 20 mg 1 tab after supper  DM (diabetes mellitus), type 2 with peripheral vascular complications (HCC) -  last A1c 5.8, diet-controlled  Anemia of chronic disease -  hgb 10.5, 11/02/21    Past Medical History:  Diagnosis Date   GERD (gastroesophageal reflux disease)    Hyperlipemia    Hypertension    TIA (transient ischemic attack)    Past Surgical History:  Procedure Laterality Date   ABDOMINAL HYSTERECTOMY     TONSILLECTOMY      Allergies  Allergen Reactions   Tape Other (See Comments)    SKIN IS THIN- WILL BRUISE AND TEAR EASILY!!    Sulfa Antibiotics Hives    Outpatient Encounter Medications as of 12/01/2021  Medication Sig   acetaminophen (TYLENOL) 650 MG CR tablet Take 1,300 mg by mouth every 8 (eight) hours.   acetaminophen (TYLENOL) 650 MG CR tablet Take 650 mg by mouth 2 (two) times daily as needed for pain.   ASPERCREME ARTHRITIS PAIN 1 % GEL Apply 2-4 g topically 4 (four) times daily as needed (for pain).   aspirin 81 MG chewable tablet Chew 81 mg by mouth daily at 3 pm.    BIOFREEZE 4 % GEL Apply 1 application topically 2 (two) times daily as needed (to painful areas).   bisacodyl (DULCOLAX) 10 MG suppository as needed. If not relieved by MOM, give 10 mg Bisacodyl suppositiory rectally X 1 dose in 24 hours as needed   carvedilol (COREG) 12.5 MG tablet Take 12.5 mg by mouth 2 (two) times daily with a meal.   Lidocaine-Glycerin (PREPARATION H EX) Apply topically. Apply cream topically to external Hemorrhoids twice daily PRN for Irriation   Loperamide HCl (IMODIUM A-D PO) Take by mouth. 4 mg p.o. after 1st loose stool as needed.   lovastatin (MEVACOR) 20 MG tablet Take 20 mg by mouth daily after supper.   magnesium hydroxide (MILK OF MAGNESIA) 400 MG/5ML suspension If no BM in 3 days, give 30 cc Milk of Magnesium p.o. x 1 dose in 24 hours as needed (Do not use standing constipation orders for residents with renal failure CFR less than 30. Contact MD for orders) (Physician Order)   NON FORMULARY diet  clarification - pureed Heart healthy diet   Nutritional Supplements (ENSURE PO) Take 237 mLs by mouth in the morning and at bedtime.   pantoprazole (PROTONIX) 40 MG tablet Take 1 tablet (40 mg total) by mouth daily.   polyethylene glycol (MIRALAX / GLYCOLAX) 17 g packet Take 17 g by mouth daily. 4-8 OZ OF WATER AND DRINK BY MOUTH ONCE DAILY AS NEEDED FOR CONSTIPATION   sennosides-docusate sodium (SENOKOT-S) 8.6-50 MG tablet Take 2 tablets by mouth 2 (two) times daily.   Sodium Phosphates (RA SALINE ENEMA RE) Place rectally.  If not relieved by Biscodyl suppository, give disposable Saline Enema rectally X 1 dose/24 hrs as needed   No facility-administered encounter medications on file as of 12/01/2021.    Review of Systems  Constitutional:  Negative for appetite change, chills and fever.  HENT:  Negative for congestion, hearing loss, rhinorrhea and sore throat.   Eyes: Negative.   Respiratory:  Negative for cough, shortness of breath and wheezing.   Cardiovascular:  Negative for chest pain, palpitations and leg swelling.  Gastrointestinal:  Negative for abdominal pain, constipation, diarrhea, nausea and vomiting.  Genitourinary:  Negative for dysuria.  Musculoskeletal:  Negative for arthralgias, back pain and myalgias.  Skin:  Negative for color change, rash and wound.  Neurological:  Negative for dizziness, weakness and headaches.  Psychiatric/Behavioral:  Negative for behavioral problems. The patient is not nervous/anxious.       Immunization History  Administered Date(s) Administered   Influenza, High Dose Seasonal PF 08/27/2018   Influenza-Unspecified 08/27/2020   Pertinent  Health Maintenance Due  Topic Date Due   FOOT EXAM  Never done   OPHTHALMOLOGY EXAM  Never done   URINE MICROALBUMIN  Never done   DEXA SCAN  Never done   INFLUENZA VACCINE  06/27/2021   HEMOGLOBIN A1C  01/24/2022   Fall Risk 07/28/2021 07/28/2021 07/28/2021 07/29/2021 07/29/2021  Patient Fall Risk Level High fall risk High fall risk Moderate fall risk Moderate fall risk Moderate fall risk     Vitals:   12/01/21 1300  BP: 133/74  Pulse: 80  Resp: 18  Temp: (!) 97.3 F (36.3 C)  Weight: 171 lb 6.4 oz (77.7 kg)  Height: 5\' 2"  (1.575 m)   Body mass index is 31.35 kg/m.  Physical Exam Constitutional:      Appearance: She is obese.  HENT:     Head: Normocephalic and atraumatic.     Nose: Nose normal.     Mouth/Throat:     Mouth: Mucous membranes are moist.  Eyes:     Conjunctiva/sclera: Conjunctivae normal.   Cardiovascular:     Rate and Rhythm: Normal rate and regular rhythm.  Pulmonary:     Effort: Pulmonary effort is normal.     Breath sounds: Normal breath sounds.  Abdominal:     General: Bowel sounds are normal.     Palpations: Abdomen is soft.  Musculoskeletal:        General: Normal range of motion.     Cervical back: Normal range of motion.  Skin:    General: Skin is warm and dry.  Neurological:     General: No focal deficit present.     Mental Status: She is alert and oriented to person, place, and time.  Psychiatric:        Mood and Affect: Mood normal.        Behavior: Behavior normal.        Thought Content: Thought content normal.  Judgment: Judgment normal.       Labs reviewed: Recent Labs    07/23/21 0241 07/27/21 1530 07/27/21 1535 08/03/21 0000 11/02/21 0000  NA 129* 133* 134* 135* 140  K 4.1 3.9 3.8 4.9 4.7  CL 91* 103 102 96* 97*  CO2 27 23  --  27* 22  GLUCOSE 111* 108* 103*  --   --   BUN 25* 33* 31* 25* 29*  CREATININE 0.82 0.82 0.90 0.9 1.0  CALCIUM 10.3 8.2*  --  9.4 9.7   Recent Labs    07/23/21 0241 07/27/21 1530 08/03/21 0000 11/02/21 0000  AST 17 15 14  12*  ALT 14 12 8 8   ALKPHOS 75 57 75 78  BILITOT 0.9 0.6  --   --   PROT 7.0 6.1*  --   --   ALBUMIN 3.3* 2.9* 3.5 3.5   Recent Labs    07/23/21 0241 07/27/21 1530 07/27/21 1535 08/03/21 0000 11/02/21 0000  WBC 8.6 7.6  --  7.8 7.7  NEUTROABS 5.5 5.2  --   --   --   HGB 11.5* 10.0* 10.2* 10.6* 10.5*  HCT 36.6 32.9* 30.0* 32* 34*  MCV 96.8 100.6*  --   --   --   PLT 270 264  --  269 287   Lab Results  Component Value Date   TSH 2.75 11/02/2021   Lab Results  Component Value Date   HGBA1C 6.5 (H) 07/27/2021   Lab Results  Component Value Date   CHOL 148 07/27/2021   HDL 45 07/27/2021   LDLCALC 62 07/27/2021   TRIG 205 (H) 07/27/2021   CHOLHDL 3.3 07/27/2021    Significant Diagnostic Results in last 30 days:  No results found.  Assessment/Plan  1.  Primary hypertension -  -Blood pressure well controlled Continue current medications  2. History of CVA (cerebrovascular accident) -Stable, continue aspirin and lovastatin  3. DM (diabetes mellitus), type 2 with peripheral vascular complications (HCC) -  A1c 5.8, 11/23/21 -  diet-controlled  4. Anemia of chronic disease -  hgb 10.5, 11/02/21 -   stable    Family/ staff Communication: Discussed plan of care with resident and charge nurse  Labs/tests ordered:   None    11/25/21, DNP, MSN, FNP-BC [facility] [phone] (Monday-Friday 8:00 a.m. - 5:00 p.m.) [phone] (after hours)

## 2021-12-07 ENCOUNTER — Encounter: Payer: Self-pay | Admitting: Adult Health

## 2021-12-07 ENCOUNTER — Non-Acute Institutional Stay (SKILLED_NURSING_FACILITY): Payer: Medicare Other | Admitting: Adult Health

## 2021-12-07 DIAGNOSIS — E1151 Type 2 diabetes mellitus with diabetic peripheral angiopathy without gangrene: Secondary | ICD-10-CM

## 2021-12-07 DIAGNOSIS — M159 Polyosteoarthritis, unspecified: Secondary | ICD-10-CM | POA: Diagnosis not present

## 2021-12-07 DIAGNOSIS — H6123 Impacted cerumen, bilateral: Secondary | ICD-10-CM | POA: Diagnosis not present

## 2021-12-07 NOTE — Progress Notes (Signed)
Location:  [facility] Room Number: 222-A Place of Service:  SNF (31) Provider:  Kenard Gower, DNP, FNP-BC  Patient Care Team: [name] as PCP - General (Family Medicine)  Extended Emergency Contact Information Primary Emergency Contact: [name] Height [place] of [place] Mobile Phone: [phone] Relation: Daughter Secondary Emergency Contact: parrot,edna Mobile Phone: [phone] Relation: Relative  Code Status:  Full Code   Goals of care: Advanced Directive information Advanced Directives 12/07/2021  Does Patient Have a Medical Advance Directive? No  Type of Advance Directive -  Would patient like information on creating a medical advance directive? No - Patient declined  Pre-existing out of facility DNR order (yellow form or pink MOST form) -     Chief Complaint  Patient presents with   Acute Visit    Feeling of ear fullness     HPI:  Pt is a 88 y.o. female seen today for an acute visit.  She is a long-term care resident of [facility]. She complains of having fullness on bilateral ears. Bilateral ears noted to have moderate amount of dry brown cerumen. She complains of decreased hearing on both ears for the past 3 weeks. She denies ear pain. She, also, complains of bilateral lower leg pain, from knees to ankle. Biofreeze gel is currently applied topically to bilateral knees for osteoarthritic pain.   Past Medical History:  Diagnosis Date   GERD (gastroesophageal reflux disease)    Hyperlipemia    Hypertension    TIA (transient ischemic attack)    Past Surgical History:  Procedure Laterality Date   ABDOMINAL HYSTERECTOMY     TONSILLECTOMY      Allergies  Allergen Reactions   Tape Other (See Comments)    SKIN IS THIN- WILL BRUISE AND TEAR EASILY!!   Sulfa Antibiotics Hives    Outpatient Encounter Medications as of 12/07/2021  Medication Sig   acetaminophen (TYLENOL) 650 MG CR  tablet Take 1,300 mg by mouth daily. Every morning   acetaminophen (TYLENOL) 650 MG CR tablet Take 650 mg by mouth 2 (two) times daily as needed for pain.   ASPERCREME ARTHRITIS PAIN 1 % GEL Apply 2-4 g topically 4 (four) times daily as needed (for pain).   aspirin 81 MG chewable tablet Chew 81 mg by mouth daily at 3 pm.    BIOFREEZE 4 % GEL Apply 1 application topically in the morning, at noon, and at bedtime.   bisacodyl (DULCOLAX) 10 MG suppository as needed. If not relieved by MOM, give 10 mg Bisacodyl suppositiory rectally X 1 dose in 24 hours as needed   carvedilol (COREG) 12.5 MG tablet Take 12.5 mg by mouth 2 (two) times daily with a meal.   Lidocaine-Glycerin (PREPARATION H EX) Apply topically. Apply cream topically to external Hemorrhoids twice daily PRN for Irriation   Loperamide HCl (IMODIUM A-D PO) Take by mouth. 4 mg p.o. after 1st loose stool as needed.   lovastatin (MEVACOR) 20 MG tablet Take 20 mg by mouth daily after supper.   magnesium hydroxide (MILK OF MAGNESIA) 400 MG/5ML suspension If no BM in 3 days, give 30 cc Milk of Magnesium p.o. x 1 dose in 24 hours as needed (Do not use standing constipation orders for residents with renal failure CFR less than 30. Contact MD for orders) (Physician Order)   NON FORMULARY diet clarification - pureed Heart healthy diet   Nutritional Supplements (ENSURE PO) Take 237 mLs by mouth in the morning and at bedtime.  pantoprazole (PROTONIX) 40 MG tablet Take 1 tablet (40 mg total) by mouth daily.   polyethylene glycol (MIRALAX / GLYCOLAX) 17 g packet Take 17 g by mouth daily. 4-8 OZ OF WATER AND DRINK BY MOUTH ONCE DAILY AS NEEDED FOR CONSTIPATION   sennosides-docusate sodium (SENOKOT-S) 8.6-50 MG tablet Take 2 tablets by mouth 2 (two) times daily.   Sodium Phosphates (RA SALINE ENEMA RE) Place rectally. If not relieved by Biscodyl suppository, give disposable Saline Enema rectally X 1 dose/24 hrs as needed   No facility-administered encounter  medications on file as of 12/07/2021.    Review of Systems  Constitutional:  Negative for appetite change, chills, fatigue and fever.  HENT:  Positive for hearing loss. Negative for congestion, rhinorrhea, sore throat and tinnitus.        Has a feeling of fullness on bilateral ears  Eyes: Negative.   Respiratory:  Negative for cough, shortness of breath and wheezing.   Cardiovascular:  Negative for chest pain, palpitations and leg swelling.  Gastrointestinal:  Negative for abdominal pain, constipation, diarrhea, nausea and vomiting.  Genitourinary:  Negative for dysuria.  Musculoskeletal:  Negative for joint swelling.       Bilateral lower leg pain, from knees to ankles.  Skin:  Negative for color change, rash and wound.  Neurological:  Negative for dizziness, weakness and headaches.  Psychiatric/Behavioral:  Negative for behavioral problems. The patient is not nervous/anxious.       Immunization History  Administered Date(s) Administered   Influenza, High Dose Seasonal PF 08/27/2018   Influenza-Unspecified 08/27/2020   Pertinent  Health Maintenance Due  Topic Date Due   FOOT EXAM  Never done   OPHTHALMOLOGY EXAM  Never done   URINE MICROALBUMIN  Never done   DEXA SCAN  Never done   INFLUENZA VACCINE  06/27/2021   HEMOGLOBIN A1C  05/24/2022   Fall Risk 07/28/2021 07/28/2021 07/28/2021 07/29/2021 07/29/2021  Patient Fall Risk Level High fall risk High fall risk Moderate fall risk Moderate fall risk Moderate fall risk     Vitals:   12/07/21 1154  Weight: 171 lb (77.6 kg)  Height: 5\' 2"  (1.575 m)   Body mass index is 31.28 kg/m.  Physical Exam Constitutional:      Appearance: She is obese.  HENT:     Head: Normocephalic and atraumatic.     Right Ear: There is impacted cerumen.     Left Ear: There is impacted cerumen.     Nose: Nose normal.     Mouth/Throat:     Mouth: Mucous membranes are moist.  Eyes:     Conjunctiva/sclera: Conjunctivae normal.  Cardiovascular:      Rate and Rhythm: Normal rate and regular rhythm.  Pulmonary:     Effort: Pulmonary effort is normal.     Breath sounds: Normal breath sounds.  Abdominal:     General: Bowel sounds are normal.     Palpations: Abdomen is soft.  Musculoskeletal:        General: No swelling.     Cervical back: Normal range of motion.  Skin:    General: Skin is warm and dry.  Neurological:     Mental Status: She is alert and oriented to person, place, and time.  Psychiatric:        Mood and Affect: Mood normal.        Behavior: Behavior normal.        Thought Content: Thought content normal.        Judgment: Judgment  normal.       Labs reviewed: Recent Labs    07/23/21 0241 07/27/21 1530 07/27/21 1535 08/03/21 0000 11/02/21 0000  NA 129* 133* 134* 135* 140  K 4.1 3.9 3.8 4.9 4.7  CL 91* 103 102 96* 97*  CO2 27 23  --  27* 22  GLUCOSE 111* 108* 103*  --   --   BUN 25* 33* 31* 25* 29*  CREATININE 0.82 0.82 0.90 0.9 1.0  CALCIUM 10.3 8.2*  --  9.4 9.7   Recent Labs    07/23/21 0241 07/27/21 1530 08/03/21 0000 11/02/21 0000  AST 17 15 14  12*  ALT 14 12 8 8   ALKPHOS 75 57 75 78  BILITOT 0.9 0.6  --   --   PROT 7.0 6.1*  --   --   ALBUMIN 3.3* 2.9* 3.5 3.5   Recent Labs    07/23/21 0241 07/27/21 1530 07/27/21 1535 08/03/21 0000 11/02/21 0000  WBC 8.6 7.6  --  7.8 7.7  NEUTROABS 5.5 5.2  --   --   --   HGB 11.5* 10.0* 10.2* 10.6* 10.5*  HCT 36.6 32.9* 30.0* 32* 34*  MCV 96.8 100.6*  --   --   --   PLT 270 264  --  269 287   Lab Results  Component Value Date   TSH 2.75 11/02/2021   Lab Results  Component Value Date   HGBA1C 5.8 11/23/2021   Lab Results  Component Value Date   CHOL 140 11/23/2021   HDL 44 11/23/2021   LDLCALC 68 11/23/2021   TRIG 139 11/23/2021   CHOLHDL 3.3 07/27/2021    Significant Diagnostic Results in last 30 days:  No results found.  Assessment/Plan  1. Impacted cerumen of both ears -   Debrox otic drops instilled 6 drops to bilateral  ears 4 times daily x4 days and irrigate on the day twice daily x1  2. Primary osteoarthritis involving multiple joints -Change Biofreeze 4% gel to be applied from bilateral knees to ankles 3 times daily -   Continue arthritis pain ER 650 mg 2 tablets = 1800 mg by mouth every morning and 650 mg twice a day  3. DM (diabetes mellitus), type 2 with peripheral vascular complications [facility]) Lab Results  Component Value Date   HGBA1C 5.8 11/23/2021   -   Diet controlled     Family/ staff Communication: Discussed plan of care with resident and charge nurse  Labs/tests ordered:   None    [facility], DNP, MSN, FNP-BC [facility] [phone] (Monday-Friday 8:00 a.m. - 5:00 p.m.) [phone] (after hours)

## 2021-12-26 ENCOUNTER — Encounter: Payer: Self-pay | Admitting: Adult Health

## 2021-12-26 ENCOUNTER — Non-Acute Institutional Stay (INDEPENDENT_AMBULATORY_CARE_PROVIDER_SITE_OTHER): Payer: Medicare Other | Admitting: Adult Health

## 2021-12-26 DIAGNOSIS — Z0000 Encounter for general adult medical examination without abnormal findings: Secondary | ICD-10-CM | POA: Diagnosis not present

## 2021-12-26 NOTE — Progress Notes (Signed)
Subjective:   Jill Hernandez is a 88 y.o. female who presents for Medicare Annual (Subsequent) preventive examination.  Review of Systems     Cardiac Risk Factors include: advanced age (>50men, >89 women);obesity (BMI >30kg/m2)     Objective:    Today's Vitals   12/26/21 1141 12/26/21 1143  BP:  115/60  Pulse:  76  Resp:  18  Temp:  (!) 97.3 F (36.3 C)  SpO2:  97%  Weight: 171 lb 6.4 oz (77.7 kg) 171 lb 6.4 oz (77.7 kg)  Height:  (1.575 m)  (1.575 m)  PainSc:  8    Body mass index is 31.35 kg/m.  Advanced Directives 12/26/2021 12/07/2021 11/09/2021 09/30/2021 09/05/2021 08/10/2021 07/28/2021  Does Patient Have a Medical Advance Directive? No No No No No No No  Type of Advance Directive - - - - - - -  Would patient like information on creating a medical advance directive? No - Patient declined No - Patient declined No - Patient declined No - Patient declined No - Patient declined No - Patient declined No - Patient declined  Pre-existing out of facility DNR order (yellow form or pink MOST form) - - - - - - -    Current Medications (verified) Outpatient Encounter Medications as of 12/26/2021  Medication Sig   acetaminophen (TYLENOL) 650 MG CR tablet Take 1,300 mg by mouth daily. Every morning   ASPERCREME ARTHRITIS PAIN 1 % GEL Apply 2-4 g topically 4 (four) times daily as needed (for pain).   aspirin 81 MG chewable tablet Chew 81 mg by mouth daily at 3 pm.    BIOFREEZE 4 % GEL Apply 1 application topically in the morning, at noon, and at bedtime.   bisacodyl (DULCOLAX) 10 MG suppository as needed. If not relieved by MOM, give 10 mg Bisacodyl suppositiory rectally X 1 dose in 24 hours as needed   carvedilol (COREG) 12.5 MG tablet Take 12.5 mg by mouth 2 (two) times daily with a meal.   Lidocaine-Glycerin (PREPARATION H EX) Apply topically. Apply cream topically to external Hemorrhoids twice daily PRN for Irriation   Loperamide HCl (IMODIUM A-D PO) Take by mouth. 4  mg p.o. after 1st loose stool as needed.   lovastatin (MEVACOR) 20 MG tablet Take 20 mg by mouth daily after supper.   magnesium hydroxide (MILK OF MAGNESIA) 400 MG/5ML suspension If no BM in 3 days, give 30 cc Milk of Magnesium p.o. x 1 dose in 24 hours as needed (Do not use standing constipation orders for residents with renal failure CFR less than 30. Contact MD for orders) (Physician Order)   NON FORMULARY diet clarification - pureed Heart healthy diet   Nutritional Supplements (ENSURE PO) Take 237 mLs by mouth in the morning and at bedtime.   pantoprazole (PROTONIX) 40 MG tablet Take 1 tablet (40 mg total) by mouth daily.   polyethylene glycol (MIRALAX / GLYCOLAX) 17 g packet Take 17 g by mouth daily. 4-8 OZ OF WATER AND DRINK BY MOUTH ONCE DAILY AS NEEDED FOR CONSTIPATION   sennosides-docusate sodium (SENOKOT-S) 8.6-50 MG tablet Take 2 tablets by mouth 2 (two) times daily.   Sodium Phosphates (RA SALINE ENEMA RE) Place rectally. If not relieved by Biscodyl suppository, give disposable Saline Enema rectally X 1 dose/24 hrs as needed   acetaminophen (TYLENOL) 650 MG CR tablet Take 650 mg by mouth 2 (two) times daily as needed for pain.   No facility-administered encounter medications on file as  of 12/26/2021.    Allergies (verified) Tape and Sulfa antibiotics   History: Past Medical History:  Diagnosis Date   GERD (gastroesophageal reflux disease)    Hyperlipemia    Hypertension    TIA (transient ischemic attack)    Past Surgical History:  Procedure Laterality Date   ABDOMINAL HYSTERECTOMY     TONSILLECTOMY     Family History  Problem Relation Age of Onset   Hypertension Mother    Hyperlipidemia Father    Hypertension Father    Social History   Socioeconomic History   Marital status: Married    Spouse name: Not on file   Number of children: Not on file   Years of education: Not on file   Highest education level: Not on file  Occupational History   Not on file   Tobacco Use   Smoking status: Former    Years: 20.00    Types: Cigarettes    Quit date: 04/30/1963    Years since quitting: 58.6   Smokeless tobacco: Never  Vaping Use   Vaping Use: Never used  Substance and Sexual Activity   Alcohol use: No   Drug use: No   Sexual activity: Not on file  Other Topics Concern   Not on file  Social History Narrative   Not on file   Social Determinants of Health   Financial Resource Strain: Not on file  Food Insecurity: Not on file  Transportation Needs: Not on file  Physical Activity: Not on file  Stress: Not on file  Social Connections: Not on file    Tobacco Counseling Counseling given: Not Answered   Clinical Intake:  Pre-visit preparation completed: No  Pain : 0-10 Pain Score: 8  Pain Type: Chronic pain Pain Location: Knee Pain Orientation: Right, Left Pain Descriptors / Indicators: Throbbing Pain Onset: Other (comment) (Everyday) Pain Frequency: Constant Pain Relieving Factors: Biofreeze gel Effect of Pain on Daily Activities: still goes on with daily routines  Pain Relieving Factors: Biofreeze gel  BMI - recorded: 31.35 Nutritional Status: BMI > 30  Obese Nutritional Risks: None Diabetes: No  How often do you need to have someone help you when you read instructions, pamphlets, or other written materials from your doctor or pharmacy?: 5 - Always What is the last grade level you completed in school?: 2 years of college  Diabetic? NO  Interpreter Needed?: No  Information entered by :: Torra Pala Medina-Vargas DNP   Activities of Daily Living In your present state of health, do you have any difficulty performing the following activities: 12/26/2021 07/28/2021  Hearing? Y N  Comment - pt has hearing aids  Vision? N Y  Difficulty concentrating or making decisions? N Y  Walking or climbing stairs? Y N  Dressing or bathing? Y Y  Doing errands, shopping? Y N  Comment - pts daughter take her appt.  Preparing Food and  eating ? Y -  Using the Toilet? Y -  In the past six months, have you accidently leaked urine? Y -  Do you have problems with loss of bowel control? Y -  Managing your Medications? Y -  Managing your Finances? Y -  Housekeeping or managing your Housekeeping? Y -  Some recent data might be hidden    Patient Care Team: Jarrett Soho, PA-C as PCP - General (Family Medicine)  Indicate any recent Medical Services you may have received from other than [facility] providers in the past year (date may be approximate).     Assessment:  This is a routine wellness examination for Kadeja.  Hearing/Vision screen Hearing Screening - Comments:: Will go to audiologist this afternoon. Vision Screening - Comments:: Referred to eye doctor.  Dietary issues and exercise activities discussed: Current Exercise Habits: Structured exercise class, Type of exercise: exercise ball, Time (Minutes): 10, Frequency (Times/Week): 5, Weekly Exercise (Minutes/Week): 50, Intensity: Mild, Exercise limited by: orthopedic condition(s)   Goals Addressed               This Visit's Progress     COMPLETED: DIET - INCREASE WATER INTAKE        Exercise 3x per week (30 min per time) (pt-stated)         Participate in restorative exercises.  Be more patient to self and others.  Be happy and content.      Depression Screen PHQ 2/9 Scores 12/26/2021  PHQ - 2 Score 0    Fall Risk Fall Risk  12/26/2021  Falls in the past year? 0  Number falls in past yr: 0  Injury with Fall? 0  Follow up Falls prevention discussed;Education provided    FALL RISK PREVENTION PERTAINING TO THE HOME:  Any stairs in or around the home? No  If so, are there any without handrails? No  Home free of loose throw rugs in walkways, pet beds, electrical cords, etc? Yes  Adequate lighting in your home to reduce risk of falls? Yes   ASSISTIVE DEVICES UTILIZED TO PREVENT FALLS:  Life alert? No  Use of a cane, walker or w/c? Yes  Grab bars  in the bathroom? Yes  Shower chair or bench in shower? Yes  Elevated toilet seat or a handicapped toilet? Yes   TIMED UP AND GO:  Was the test performed? No .  Length of time to ambulate 10 feet: N/A sec.   Gait unsteady with use of assistive device, provider informed and education provided.   Cognitive Function: MMSE - Mini Mental State Exam 12/26/2021  Orientation to time 5  Orientation to Place 5  Registration 3  Attention/ Calculation 5  Recall 3  Language- name 2 objects 2  Language- repeat 1  Language- follow 3 step command 3  Language- read & follow direction 1  Write a sentence 1  Copy design 1  Total score 30     6CIT Screen 12/26/2021  What Year? 0 points  What month? 0 points  What time? 0 points  Count back from 20 0 points  Months in reverse 0 points  Repeat phrase 2 points  Total Score 2    Immunizations Immunization History  Administered Date(s) Administered   Influenza, High Dose Seasonal PF 08/27/2018   Influenza-Unspecified 08/27/2020    TDAP status: Up to date  Flu Vaccine status: Up to date  Pneumococcal vaccine status: Due, Education has been provided regarding the importance of this vaccine. Advised may receive this vaccine at local pharmacy or Health Dept. Aware to provide a copy of the vaccination record if obtained from local pharmacy or Health Dept. Verbalized acceptance and understanding.  Covid-19 vaccine status: Information provided on how to obtain vaccines.   Qualifies for Shingles Vaccine? Yes   Zostavax completed No   Shingrix Completed?: Yes  Screening Tests Health Maintenance  Topic Date Due   COVID-19 Vaccine (1) Never done   Pneumonia Vaccine 92+ Years old (1 - PCV) Never done   FOOT EXAM  Never done   OPHTHALMOLOGY EXAM  Never done   URINE MICROALBUMIN  Never done  DEXA SCAN  Never done   INFLUENZA VACCINE  06/27/2021   Zoster Vaccines- Shingrix (1 of 2) 12/31/2021 (Originally 07/15/1980)   TETANUS/TDAP  09/30/2022  (Originally 07/16/1949)   HEMOGLOBIN A1C  05/24/2022   HPV VACCINES  Aged Out    Health Maintenance  Health Maintenance Due  Topic Date Due   COVID-19 Vaccine (1) Never done   Pneumonia Vaccine 21+ Years old (1 - PCV) Never done   FOOT EXAM  Never done   OPHTHALMOLOGY EXAM  Never done   URINE MICROALBUMIN  Never done   DEXA SCAN  Never done   INFLUENZA VACCINE  06/27/2021    Colorectal cancer screening: Type of screening: Colonoscopy. Completed Cannot recall. Repeat every N/A years  Mammogram status: No longer required due to N/A.  Bone Density status: Ordered N/A. Pt provided with contact info and advised to call to schedule appt.   Lung Cancer Screening: (Low Dose CT Chest recommended if Age 86-80 years, 30 pack-year currently smoking OR have quit w/in 15years.) does not qualify.   Lung Cancer Screening Referral: N/A  Additional Screening:  Hepatitis C Screening: does qualify; Completed ordered  Vision Screening: Recommended annual ophthalmology exams for early detection of glaucoma and other disorders of the eye. Is the patient up to date with their annual eye exam?  No  Who is the provider or what is the name of the office in which the patient attends annual eye exams? Does not recall If pt is not established with a provider, would they like to be referred to a provider to establish care? Yes .   Dental Screening: Recommended annual dental exams for proper oral hygiene  Community Resource Referral / Chronic Care Management: CRR required this visit?  No   CCM required this visit?   No     Plan:     I have personally reviewed and noted the following in the patients chart:   Medical and social history Use of alcohol, tobacco or illicit drugs  Current medications and supplements including opioid prescriptions.  Functional ability and status Nutritional status Physical activity Advanced directives List of other physicians Hospitalizations, surgeries, and ER  visits in previous 12 months Vitals Screenings to include cognitive, depression, and falls Referrals and appointments  In addition, I have reviewed and discussed with patient certain preventive protocols, quality metrics, and best practice recommendations. A written personalized care plan for preventive services as well as general preventive health recommendations were provided to patient.     Lundon Verdejo Medina-Vargas, NP   12/26/2021   Nurse Notes:   Will need annual wellness visit.

## 2021-12-27 DIAGNOSIS — I10 Essential (primary) hypertension: Secondary | ICD-10-CM | POA: Diagnosis not present

## 2021-12-29 LAB — HM HEPATITIS C SCREENING LAB: HM Hepatitis Screen: NEGATIVE

## 2022-01-05 DIAGNOSIS — R262 Difficulty in walking, not elsewhere classified: Secondary | ICD-10-CM | POA: Diagnosis not present

## 2022-01-05 DIAGNOSIS — L853 Xerosis cutis: Secondary | ICD-10-CM | POA: Diagnosis not present

## 2022-01-05 DIAGNOSIS — B351 Tinea unguium: Secondary | ICD-10-CM | POA: Diagnosis not present

## 2022-01-05 DIAGNOSIS — M79674 Pain in right toe(s): Secondary | ICD-10-CM | POA: Diagnosis not present

## 2022-01-05 DIAGNOSIS — M79675 Pain in left toe(s): Secondary | ICD-10-CM | POA: Diagnosis not present

## 2022-01-13 ENCOUNTER — Non-Acute Institutional Stay (SKILLED_NURSING_FACILITY): Payer: Medicare Other | Admitting: Adult Health

## 2022-01-13 ENCOUNTER — Encounter: Payer: Self-pay | Admitting: Adult Health

## 2022-01-13 DIAGNOSIS — I10 Essential (primary) hypertension: Secondary | ICD-10-CM | POA: Diagnosis not present

## 2022-01-13 DIAGNOSIS — N1831 Chronic kidney disease, stage 3a: Secondary | ICD-10-CM

## 2022-01-13 DIAGNOSIS — E1151 Type 2 diabetes mellitus with diabetic peripheral angiopathy without gangrene: Secondary | ICD-10-CM | POA: Diagnosis not present

## 2022-01-13 DIAGNOSIS — M159 Polyosteoarthritis, unspecified: Secondary | ICD-10-CM

## 2022-01-13 DIAGNOSIS — Z8673 Personal history of transient ischemic attack (TIA), and cerebral infarction without residual deficits: Secondary | ICD-10-CM

## 2022-01-13 NOTE — Progress Notes (Signed)
Location:  [place] Room Number: 126-B Place of Service:  SNF (31) Provider:  Durenda Age, DNP, FNP-BC  Patient Care Team: Hendricks Limes, MD as PCP - General (Internal Medicine) Medina-Vargas, Senaida Lange, NP as Nurse Practitioner (Internal Medicine)  Extended Emergency Contact Information Primary Emergency Contact: Lupita Raider [place] Mobile Phone: [phone] Relation: Daughter Secondary Emergency Contact: parrot,edna Mobile Phone: [phone] Relation: Relative  Code Status:   Full Code  Goals of care: Advanced Directive information Advanced Directives 01/13/2022  Does Patient Have a Medical Advance Directive? No  Type of Advance Directive -  Would patient like information on creating a medical advance directive? No - Patient declined  Pre-existing out of facility DNR order (yellow form or pink MOST form) -     Chief Complaint  Patient presents with   Routine    HPI:  Pt is a 88 y.o. female seen today for medical management of chronic diseases. She is a long-term care resident of [facility]. She has a PMH of hypertension, hyperlipidemia, GERD and multiple TIAs.  DM (diabetes mellitus), type 2 with peripheral vascular complications (HCC)  - diet-controlled  History of CVA (cerebrovascular accident)  -takes aspirin 81 mg daily and lovastatin 20 mg 1 tab daily at supper  Primary hypertension -  SBPs ranging from 111 to 138, with outlier 99 and 100, takes Coreg 12.5 mg BID  Primary osteoarthritis involving multiple joints - on Biofreeze 4% gel topically to bilateral ankles and knees    Past Medical History:  Diagnosis Date   GERD (gastroesophageal reflux disease)    Hyperlipemia    Hypertension    TIA (transient ischemic attack)    Past Surgical History:  Procedure Laterality Date   ABDOMINAL HYSTERECTOMY     TONSILLECTOMY      Allergies  Allergen Reactions   Tape Other (See  Comments)    SKIN IS THIN- WILL BRUISE AND TEAR EASILY!!   Sulfa Antibiotics Hives    Outpatient Encounter Medications as of 01/13/2022  Medication Sig   acetaminophen (TYLENOL) 650 MG CR tablet Take 1,300 mg by mouth daily. Every morning   acetaminophen (TYLENOL) 650 MG CR tablet Take 650 mg by mouth 2 (two) times daily as needed for pain.   ammonium lactate (AMLACTIN) 12 % cream apply to both feet daily until resolved   ASPERCREME ARTHRITIS PAIN 1 % GEL Apply 2-4 g topically 4 (four) times daily as needed (for pain).   aspirin 81 MG chewable tablet Chew 81 mg by mouth daily at 3 pm.    BIOFREEZE 4 % GEL Apply 1 application topically in the morning, at noon, and at bedtime.   bisacodyl (DULCOLAX) 10 MG suppository as needed. If not relieved by MOM, give 10 mg Bisacodyl suppositiory rectally X 1 dose in 24 hours as needed   carvedilol (COREG) 12.5 MG tablet Take 12.5 mg by mouth 2 (two) times daily with a meal.   lovastatin (MEVACOR) 20 MG tablet Take 20 mg by mouth daily after supper.   magnesium hydroxide (MILK OF MAGNESIA) 400 MG/5ML suspension If no BM in 3 days, give 30 cc Milk of Magnesium p.o. x 1 dose in 24 hours as needed (Do not use standing constipation orders for residents with renal failure CFR less than 30. Contact MD for orders) (Physician Order)   NON FORMULARY diet clarification - pureed Heart healthy diet   Nutritional Supplements (ENSURE PO) Take 237 mLs by mouth in the morning  and at bedtime.   pantoprazole (PROTONIX) 40 MG tablet Take 1 tablet (40 mg total) by mouth daily.   polyethylene glycol (MIRALAX / GLYCOLAX) 17 g packet Take 17 g by mouth daily. 4-8 OZ OF WATER AND DRINK BY MOUTH ONCE DAILY AS NEEDED FOR CONSTIPATION   sennosides-docusate sodium (SENOKOT-S) 8.6-50 MG tablet Take 2 tablets by mouth 2 (two) times daily.   Sodium Phosphates (RA SALINE ENEMA RE) Place rectally. If not relieved by Biscodyl suppository, give disposable Saline Enema rectally X 1 dose/24 hrs  as needed   Loperamide HCl (IMODIUM A-D PO) Take by mouth. 4 mg p.o. after 1st loose stool as needed.   [DISCONTINUED] Lidocaine-Glycerin (PREPARATION H EX) Apply topically. Apply cream topically to external Hemorrhoids twice daily PRN for Irriation   No facility-administered encounter medications on file as of 01/13/2022.    Review of Systems  Constitutional:  Negative for appetite change, chills, fatigue and fever.  HENT:  Negative for congestion, hearing loss, rhinorrhea and sore throat.   Eyes: Negative.   Respiratory:  Negative for cough, shortness of breath and wheezing.   Cardiovascular:  Negative for chest pain, palpitations and leg swelling.  Gastrointestinal:  Negative for abdominal pain, constipation, diarrhea, nausea and vomiting.  Genitourinary:  Negative for dysuria.  Musculoskeletal:  Negative for arthralgias, back pain and myalgias.  Skin:  Negative for color change, rash and wound.  Neurological:  Negative for dizziness, weakness and headaches.  Psychiatric/Behavioral:  Negative for behavioral problems. The patient is not nervous/anxious.       Immunization History  Administered Date(s) Administered   Influenza, High Dose Seasonal PF 08/27/2018   Influenza-Unspecified 08/27/2020   Pertinent  Health Maintenance Due  Topic Date Due   FOOT EXAM  Never done   OPHTHALMOLOGY EXAM  Never done   URINE MICROALBUMIN  Never done   DEXA SCAN  Never done   INFLUENZA VACCINE  06/27/2021   HEMOGLOBIN A1C  05/24/2022   Fall Risk 07/28/2021 07/28/2021 07/29/2021 07/29/2021 12/26/2021  Falls in the past year? - - - - 0  Was there an injury with Fall? - - - - 0  Fall Risk Category Calculator - - - - 0  Fall Risk Category - - - - Low  Patient Fall Risk Level High fall risk Moderate fall risk Moderate fall risk Moderate fall risk -  Fall risk Follow up - - - - Falls prevention discussed;Education provided     Vitals:   01/13/22 1626  BP: 138/70  Pulse: 75  Resp: (!) 21  Temp: (!)  97.3 F (36.3 C)  SpO2: 97%  Weight: 172 lb 6.4 oz (78.2 kg)  Height: 5\' 2"  (1.575 m)   Body mass index is 31.53 kg/m.  Physical Exam Constitutional:      General: She is not in acute distress.    Appearance: She is obese.  HENT:     Head: Normocephalic and atraumatic.     Nose: Nose normal.     Mouth/Throat:     Mouth: Mucous membranes are moist.  Eyes:     Conjunctiva/sclera: Conjunctivae normal.  Cardiovascular:     Rate and Rhythm: Normal rate and regular rhythm.  Pulmonary:     Effort: Pulmonary effort is normal.     Breath sounds: Normal breath sounds.  Abdominal:     General: Bowel sounds are normal.     Palpations: Abdomen is soft.  Musculoskeletal:     Cervical back: Normal range of motion.  Skin:  General: Skin is warm and dry.  Neurological:     General: No focal deficit present.     Mental Status: She is alert and oriented to person, place, and time.  Psychiatric:        Mood and Affect: Mood normal.        Behavior: Behavior normal.        Thought Content: Thought content normal.        Judgment: Judgment normal.       Labs reviewed: Recent Labs    07/23/21 0241 07/27/21 1530 07/27/21 1535 08/03/21 0000 11/02/21 0000  NA 129* 133* 134* 135* 140  K 4.1 3.9 3.8 4.9 4.7  CL 91* 103 102 96* 97*  CO2 27 23  --  27* 22  GLUCOSE 111* 108* 103*  --   --   BUN 25* 33* 31* 25* 29*  CREATININE 0.82 0.82 0.90 0.9 1.0  CALCIUM 10.3 8.2*  --  9.4 9.7   Recent Labs    07/23/21 0241 07/27/21 1530 08/03/21 0000 11/02/21 0000  AST 17 15 14  12*  ALT 14 12 8 8   ALKPHOS 75 57 75 78  BILITOT 0.9 0.6  --   --   PROT 7.0 6.1*  --   --   ALBUMIN 3.3* 2.9* 3.5 3.5   Recent Labs    07/23/21 0241 07/27/21 1530 07/27/21 1535 08/03/21 0000 11/02/21 0000  WBC 8.6 7.6  --  7.8 7.7  NEUTROABS 5.5 5.2  --   --   --   HGB 11.5* 10.0* 10.2* 10.6* 10.5*  HCT 36.6 32.9* 30.0* 32* 34*  MCV 96.8 100.6*  --   --   --   PLT 270 264  --  269 287   Lab  Results  Component Value Date   TSH 2.75 11/02/2021   Lab Results  Component Value Date   HGBA1C 5.8 11/23/2021   Lab Results  Component Value Date   CHOL 140 11/23/2021   HDL 44 11/23/2021   LDLCALC 68 11/23/2021   TRIG 139 11/23/2021   CHOLHDL 3.3 07/27/2021    Significant Diagnostic Results in last 30 days:  No results found.  Assessment/Plan  1. DM (diabetes mellitus), type 2 with peripheral vascular complications [facility]) Lab Results  Component Value Date   HGBA1C 5.8 11/23/2021   -  diet-controlled  2. History of CVA (cerebrovascular accident) -Stable, continue aspirin and lovastatin  3. Primary hypertension -Blood pressure well controlled Continue current medications  4. Primary osteoarthritis involving multiple joints   Stable, continue Biofreeze 4% gel topically to bilateral knees and ankle  5. Stage 3a chronic kidney disease (HCC) Lab Results  Component Value Date   NA 140 11/02/2021   K 4.7 11/02/2021   CO2 22 11/02/2021   GLUCOSE 103 (H) 07/27/2021   BUN 29 (A) 11/02/2021   CREATININE 1.0 11/02/2021   CALCIUM 9.7 11/02/2021   GFRNONAA 52.49 11/02/2021    -  stable    Family/ staff Communication: Discussed plan of care with resident and charge nurse  Labs/tests ordered:   None    Durenda Age, DNP, MSN, FNP-BC [facility] [phone] (Monday-Friday 8:00 a.m. - 5:00 p.m.) [phone] (after hours)

## 2022-02-07 ENCOUNTER — Non-Acute Institutional Stay (SKILLED_NURSING_FACILITY): Payer: Medicare Other | Admitting: Internal Medicine

## 2022-02-07 ENCOUNTER — Encounter: Payer: Self-pay | Admitting: Internal Medicine

## 2022-02-07 DIAGNOSIS — G959 Disease of spinal cord, unspecified: Secondary | ICD-10-CM

## 2022-02-07 DIAGNOSIS — E1151 Type 2 diabetes mellitus with diabetic peripheral angiopathy without gangrene: Secondary | ICD-10-CM | POA: Diagnosis not present

## 2022-02-07 DIAGNOSIS — D539 Nutritional anemia, unspecified: Secondary | ICD-10-CM | POA: Diagnosis not present

## 2022-02-07 DIAGNOSIS — I10 Essential (primary) hypertension: Secondary | ICD-10-CM | POA: Diagnosis not present

## 2022-02-07 DIAGNOSIS — E441 Mild protein-calorie malnutrition: Secondary | ICD-10-CM | POA: Diagnosis not present

## 2022-02-07 DIAGNOSIS — E785 Hyperlipidemia, unspecified: Secondary | ICD-10-CM | POA: Diagnosis not present

## 2022-02-07 NOTE — Assessment & Plan Note (Addendum)
Current A1c is prediabetic with a value of 5.8%, on diet alone.  No change indicated clinically. ?

## 2022-02-07 NOTE — Assessment & Plan Note (Signed)
Today's blood pressure of 179/76 is an outlier.  Blood pressure records were reviewed and they have ranged from a low of 108/61 up to the present 179/76.  The majority are less than 125/65.  Continue to monitor and adjust carvedilol based on blood pressure average rather than outliers. ?

## 2022-02-07 NOTE — Assessment & Plan Note (Addendum)
She denies any new neuro signs or symptoms.  Her arthritic pain is in her knees and ankles , not the neck. ?

## 2022-02-07 NOTE — Assessment & Plan Note (Addendum)
In the context of history of TIA and stroke, current LDL is at goal of less than 70 with a value of 68.  No change indicated in her statin dosage. ?

## 2022-02-07 NOTE — Assessment & Plan Note (Signed)
Current total protein and albumin are low normal with values of 6.0 and 3.5 respectively.  Nutritionist to follow at the SNF. ?

## 2022-02-07 NOTE — Progress Notes (Signed)
? ?NURSING HOME LOCATION:  [place] ?ROOM NUMBER: 126 B ? ?CODE STATUS: Full code ? ?PCP: Durenda Age, NP ? ?This is a nursing facility follow up visit of chronic medical diagnoses & to document compliance with Regulation 483.30 (c) in [employer] [place] Phase [employer] which mandates caregiver visit ( visits can alternate among physician, PA or NP as per statutes) within 10 days of 30 days / 60 days/ 90 days post admission to SNF date   ? ?Interim medical record and care since last SNF visit was updated with review of diagnostic studies and change in clinical status since last visit were documented. ? ?HPI: She is a permanent resident of the facility with medical diagnoses of dyslipidemia, essential hypertension, history of TIA, GERD, diabetes with peripheral vascular complications, cervical myelopathy, and history of stroke. ? ?She had extensive labs completed in December 2022.  Mild prerenal azotemia was present with a BUN of 28.5.  CKD stage IIIa was present with a creatinine of 0.96 and GFR of 52.49.  Albumin was 3.5 and total protein 6.0, low normal.  Essentially normochromic, normocytic anemia was present with H/H of 10.5/33.7.  Serially the anemia is stable.  B12 level was therapeutic at 454.  TSH was also therapeutic with a value of 2.75.  A1c was prediabetic with a value of 5.8%.  LDL was at goal of less than 70 with a value of 68. ? ?Review of systems: Her main complaint is arthritis, involving the the knees, shins, & ankles.  She describes it as pain and weakness due to "bone-on-bone".  She is frustrated that she is not getting Biofreeze applied 3 times a day as ordered.  She laments that she cannot take anything besides Tylenol by mouth because of history of GI bleeding. ?She denies any new neurologic symptoms and specifically denies numbness and tingling.  Bladder incontinence is a chronic issue.  The wound on her toe is being followed by the SNF Wound Care  Nurse. ? ?Constitutional: No fever, significant weight change  ?Eyes: No redness, discharge, pain, vision change ?ENT/mouth: No nasal congestion,  purulent discharge, earache, change in hearing, sore throat  ?Cardiovascular: No chest pain, palpitations, paroxysmal nocturnal dyspnea, claudication, edema  ?Respiratory: No cough, sputum production, hemoptysis, DOE, significant snoring, apnea   ?Gastrointestinal: No heartburn, dysphagia, abdominal pain, nausea /vomiting, rectal bleeding, melena, change in bowels ?Genitourinary: No dysuria, hematuria, pyuria, nocturia ?Dermatologic: No rash, pruritus ?Neurologic: No dizziness, headache, syncope, seizures ?Psychiatric: No significant anxiety, depression, insomnia, anorexia ?Endocrine: No change in hair/skin/nails, excessive thirst, excessive hunger, excessive urination  ?Hematologic/lymphatic: No significant bruising, lymphadenopathy, abnormal bleeding ?Allergy/immunology: No itchy/watery eyes, significant sneezing, urticaria, angioedema ? ?Physical exam:  ?Pertinent or positive findings: She is very hard of hearing.  There is suggestion of anisocoria with right pupil greater than the left.  She is edentulous.  Cardiopulmonary exam was unremarkable.  Abdomen is protuberant.  Pedal pulses are decreased, dorsalis pedis pulses more so than the posterior tibial pulses.  She has an isolated DIP deformity which she relates to previous trauma rather than arthritis.  There is marked fusiform changes of the knees.  Interosseous wasting of the hands is noted.  She is tender to palpation over the shins.  There is no evidence of cellulitis clinically. ? ?General appearance: Adequately nourished; no acute distress, increased work of breathing is present.   ?Lymphatic: No lymphadenopathy about the head, neck, axilla. ?Eyes: No conjunctival inflammation or lid edema is present. There  is no scleral icterus. ?Ears:  External ear exam shows no significant lesions or deformities.   ?Nose:   External nasal examination shows no deformity or inflammation. Nasal mucosa are pink and moist without lesions, exudates ?Oral exam:  Lips and gums are healthy appearing. There is no oropharyngeal erythema or exudate. ?Neck:  No thyromegaly, masses, tenderness noted.    ?Heart:  Normal rate and regular rhythm. S1 and S2 normal without gallop, murmur, click, rub .  ?Lungs: Chest clear to auscultation without wheezes, rhonchi, rales, rubs. ?Abdomen: Bowel sounds are normal. Abdomen is soft and nontender with no organomegaly, hernias, masses. ?GU: Deferred  ?Extremities:  No cyanosis, clubbing, edema  ?Neurologic exam :Balance, Rhomberg, finger to nose testing could not be completed due to clinical state ?Skin: Warm & dry w/o tenting. ?No significant lesions or rash. ? ?See summary under each active problem in the Problem List with associated updated therapeutic plan ? ? ?

## 2022-02-07 NOTE — Assessment & Plan Note (Signed)
Serially CBC is stable.  Most recent H/H was 10.5/33.7 with essentially normocytic, normochromic indices.  B12 level was 454. ?No bleeding dyscrasias reported at the SNF. ?

## 2022-02-07 NOTE — Patient Instructions (Signed)
See assessment and plan under each diagnosis in the problem list and acutely for this visit 

## 2022-03-03 DIAGNOSIS — R279 Unspecified lack of coordination: Secondary | ICD-10-CM | POA: Diagnosis not present

## 2022-03-03 DIAGNOSIS — M6259 Muscle wasting and atrophy, not elsewhere classified, multiple sites: Secondary | ICD-10-CM | POA: Diagnosis not present

## 2022-03-06 ENCOUNTER — Encounter: Payer: Self-pay | Admitting: Adult Health

## 2022-03-06 ENCOUNTER — Non-Acute Institutional Stay (SKILLED_NURSING_FACILITY): Payer: Medicare Other | Admitting: Adult Health

## 2022-03-06 DIAGNOSIS — E1151 Type 2 diabetes mellitus with diabetic peripheral angiopathy without gangrene: Secondary | ICD-10-CM

## 2022-03-06 DIAGNOSIS — M159 Polyosteoarthritis, unspecified: Secondary | ICD-10-CM | POA: Diagnosis not present

## 2022-03-06 DIAGNOSIS — M150 Primary generalized (osteo)arthritis: Secondary | ICD-10-CM

## 2022-03-06 DIAGNOSIS — Z8673 Personal history of transient ischemic attack (TIA), and cerebral infarction without residual deficits: Secondary | ICD-10-CM | POA: Diagnosis not present

## 2022-03-06 DIAGNOSIS — I10 Essential (primary) hypertension: Secondary | ICD-10-CM | POA: Diagnosis not present

## 2022-03-06 NOTE — Progress Notes (Signed)
? ?Location:  [facility] ?Nursing Home Room Number: 126-B ?Place of Service:  SNF (31) ?Provider:  Durenda Age, DNP, FNP-BC ? ?Patient Care Team: ?Hendricks Limes, MD as PCP - General (Internal Medicine) ?Medina-Vargas, Senaida Lange, NP as Nurse Practitioner (Internal Medicine) ? ?Extended Emergency Contact Information ?Primary Emergency Contact: Edwards,Sharon ? [place] ?Mobile Phone: [phone] ?Relation: Daughter ?Secondary Emergency Contact: parrot,edna ?Mobile Phone: [phone] ?Relation: Relative ? ?Code Status:  Full Code ? ?Goals of care: Advanced Directive information ? ?  03/06/2022  ?  4:03 PM  ?Advanced Directives  ?Does Patient Have a Medical Advance Directive? No  ?Would patient like information on creating a medical advance directive? No - Patient declined  ? ? ? ?Chief Complaint  ?Patient presents with  ? Medical Management of Chronic Issues  ?  Routine Visit  ? ? ?HPI:  ?Pt is a 88 y.o. female seen today for medical management of chronic diseases. She is a long-term care resident of [facility]. She has a PMH of hypertension, hyperlipidemia, GERD and multiple TIAs. ? ?History of CVA (cerebrovascular accident) -   takes asa 81 mg daily and Lovastatin 20 mg after supper ? ?Primary osteoarthritis involving multiple joints -  bilateral lower extremities has topical aspercreme arthritis 1% gel BID PRN and takes Arthritis Pain ER 650 mg 1 tab BID ? ?DM (diabetes mellitus), type 2 with peripheral vascular complications (HCC)  -  diet-controlled ? ?Primary hypertension  -   SBPs ranging from 109 to 130, with outlier 146. She takes Carvedilol 12.5 mg BID ? ? ?Past Medical History:  ?Diagnosis Date  ? GERD (gastroesophageal reflux disease)   ? Hyperlipemia   ? Hypertension   ? TIA (transient ischemic attack)   ? ?Past Surgical History:  ?Procedure Laterality Date  ? ABDOMINAL HYSTERECTOMY    ? TONSILLECTOMY    ? ? ?Allergies  ?Allergen Reactions  ?  Tape Other (See Comments)  ?  SKIN IS THIN- WILL BRUISE AND TEAR EASILY!!  ? Sulfa Antibiotics Hives  ? ? ?Outpatient Encounter Medications as of 03/06/2022  ?Medication Sig  ? acetaminophen (TYLENOL) 650 MG CR tablet Take 1,300 mg by mouth daily. Every morning  ? acetaminophen (TYLENOL) 650 MG CR tablet Take 650 mg by mouth 2 (two) times daily as needed for pain.  ? ammonium lactate (AMLACTIN) 12 % cream apply to both feet daily until resolved  ? ASPERCREME ARTHRITIS PAIN 1 % GEL Apply 2-4 g topically 4 (four) times daily as needed (for pain).  ? aspirin 81 MG chewable tablet Chew 81 mg by mouth daily at 3 pm.   ? BIOFREEZE 4 % GEL Apply 1 application topically in the morning, at noon, and at bedtime.  ? bisacodyl (DULCOLAX) 10 MG suppository as needed. If not relieved by MOM, give 10 mg Bisacodyl suppositiory rectally X 1 dose in 24 hours as needed  ? carvedilol (COREG) 12.5 MG tablet Take 12.5 mg by mouth 2 (two) times daily with a meal.  ? Loperamide HCl (IMODIUM A-D PO) Take by mouth. 4 mg p.o. after 1st loose stool as needed.  ? lovastatin (MEVACOR) 20 MG tablet Take 20 mg by mouth daily after supper.  ? magnesium hydroxide (MILK OF MAGNESIA) 400 MG/5ML suspension If no BM in 3 days, give 30 cc Milk of Magnesium p.o. x 1 dose in 24 hours as needed (Do not use standing constipation orders for residents with renal failure CFR less than 30. Contact MD for  orders) (Physician Order)  ? NON FORMULARY diet clarification - pureed ?Heart healthy diet  ? Nutritional Supplements (ENSURE PO) Take 237 mLs by mouth in the morning and at bedtime.  ? pantoprazole (PROTONIX) 40 MG tablet Take 1 tablet (40 mg total) by mouth daily.  ? polyethylene glycol (MIRALAX / GLYCOLAX) 17 g packet Take 17 g by mouth daily. 4-8 OZ OF WATER AND DRINK BY MOUTH ONCE DAILY AS NEEDED FOR CONSTIPATION  ? Pramox-PE-Glycerin-Petrolatum (HEMORRHOIDAL EX) CREAM APPLY TOPICALLY TO EXTERNAL HEMORRHOIDS TWICE DAILY AS NEEDED - IRRITATION  ?  sennosides-docusate sodium (SENOKOT-S) 8.6-50 MG tablet Take 2 tablets by mouth 2 (two) times daily.  ? Sodium Phosphates (RA SALINE ENEMA RE) Place rectally. If not relieved by Biscodyl suppository, give disposable Saline Enema rectally X 1 dose/24 hrs as needed  ? ?No facility-administered encounter medications on file as of 03/06/2022.  ? ? ?Review of Systems  ?Constitutional:  Negative for appetite change, chills, fatigue and fever.  ?HENT:  Negative for congestion, hearing loss, rhinorrhea and sore throat.   ?Eyes: Negative.   ?Respiratory:  Negative for cough, shortness of breath and wheezing.   ?Cardiovascular:  Negative for chest pain, palpitations and leg swelling.  ?Gastrointestinal:  Negative for abdominal pain, constipation, diarrhea, nausea and vomiting.  ?Genitourinary:  Negative for dysuria.  ?Musculoskeletal:  Negative for arthralgias, back pain and myalgias.  ?Skin:  Negative for color change, rash and wound.  ?Neurological:  Negative for dizziness, weakness and headaches.  ?Psychiatric/Behavioral:  Negative for behavioral problems. The patient is not nervous/anxious.    ? ? ? ?Immunization History  ?Administered Date(s) Administered  ? Influenza, High Dose Seasonal PF 08/27/2018  ? Influenza-Unspecified 08/27/2020  ? ?Pertinent  Health Maintenance Due  ?Topic Date Due  ? FOOT EXAM  Never done  ? OPHTHALMOLOGY EXAM  Never done  ? URINE MICROALBUMIN  Never done  ? DEXA SCAN  Never done  ? HEMOGLOBIN A1C  05/24/2022  ? INFLUENZA VACCINE  06/27/2022  ? ? ?  07/28/2021  ?  1:00 PM 07/28/2021  ?  9:00 PM 07/29/2021  ?  8:00 AM 07/29/2021  ?  8:00 PM 12/26/2021  ? 11:57 AM  ?Fall Risk  ?Falls in the past year?     0  ?Was there an injury with Fall?     0  ?Fall Risk Category Calculator     0  ?Fall Risk Category     Low  ?Patient Fall Risk Level High fall risk Moderate fall risk Moderate fall risk Moderate fall risk   ?Fall risk Follow up     Falls prevention discussed;Education provided  ? ? ? ?Vitals:  ? 03/06/22  1602  ?BP: (!) 114/54  ?Pulse: 75  ?Resp: 19  ?Temp: 97.8 ?F (36.6 ?C)  ?Weight: 175 lb 9.6 oz (79.7 kg)  ?Height: 5\' 2"  (1.575 m)  ? ?Body mass index is 32.12 kg/m?. ? ?Physical Exam ?Constitutional:   ?   General: She is not in acute distress. ?   Appearance: She is obese.  ?HENT:  ?   Head: Normocephalic and atraumatic.  ?   Nose: Nose normal.  ?   Mouth/Throat:  ?   Mouth: Mucous membranes are moist.  ?Eyes:  ?   Conjunctiva/sclera: Conjunctivae normal.  ?Cardiovascular:  ?   Rate and Rhythm: Normal rate and regular rhythm.  ?Pulmonary:  ?   Effort: Pulmonary effort is normal.  ?   Breath sounds: Normal breath sounds.  ?Abdominal:  ?  General: Bowel sounds are normal.  ?   Palpations: Abdomen is soft.  ?Musculoskeletal:  ?   Cervical back: Normal range of motion.  ?Skin: ?   General: Skin is warm and dry.  ?Neurological:  ?   General: No focal deficit present.  ?   Mental Status: She is alert and oriented to person, place, and time.  ?Psychiatric:     ?   Mood and Affect: Mood normal.     ?   Behavior: Behavior normal.     ?   Thought Content: Thought content normal.     ?   Judgment: Judgment normal.  ?  ? ? ? ?Labs reviewed: ?Recent Labs  ?  07/23/21 ?0241 07/27/21 ?1530 07/27/21 ?1535 08/03/21 ?0000 11/02/21 ?0000  ?NA 129* 133* 134* 135* 140  ?K 4.1 3.9 3.8 4.9 4.7  ?CL 91* 103 102 96* 97*  ?CO2 27 23  --  27* 22  ?GLUCOSE 111* 108* 103*  --   --   ?BUN 25* 33* 31* 25* 29*  ?CREATININE 0.82 0.82 0.90 0.9 1.0  ?CALCIUM 10.3 8.2*  --  9.4 9.7  ? ?Recent Labs  ?  07/23/21 ?0241 07/27/21 ?1530 08/03/21 ?0000 11/02/21 ?0000  ?AST 17 15 14  12*  ?ALT 14 12 8 8   ?ALKPHOS 75 57 75 78  ?BILITOT 0.9 0.6  --   --   ?PROT 7.0 6.1*  --   --   ?ALBUMIN 3.3* 2.9* 3.5 3.5  ? ?Recent Labs  ?  07/23/21 ?0241 07/27/21 ?1530 07/27/21 ?1535 08/03/21 ?0000 11/02/21 ?0000  ?WBC 8.6 7.6  --  7.8 7.7  ?NEUTROABS 5.5 5.2  --   --   --   ?HGB 11.5* 10.0* 10.2* 10.6* 10.5*  ?HCT 36.6 32.9* 30.0* 32* 34*  ?MCV 96.8 100.6*  --   --   --    ?PLT 270 264  --  269 287  ? ?Lab Results  ?Component Value Date  ? TSH 2.75 11/02/2021  ? ?Lab Results  ?Component Value Date  ? HGBA1C 5.8 11/23/2021  ? ?Lab Results  ?Component Value Date  ? CHOL 140 11/24/19

## 2022-03-07 DIAGNOSIS — I10 Essential (primary) hypertension: Secondary | ICD-10-CM | POA: Diagnosis not present

## 2022-03-30 ENCOUNTER — Encounter: Payer: Self-pay | Admitting: Adult Health

## 2022-03-30 ENCOUNTER — Non-Acute Institutional Stay (SKILLED_NURSING_FACILITY): Payer: Medicare Other | Admitting: Adult Health

## 2022-03-30 DIAGNOSIS — E1151 Type 2 diabetes mellitus with diabetic peripheral angiopathy without gangrene: Secondary | ICD-10-CM

## 2022-03-30 DIAGNOSIS — Z8673 Personal history of transient ischemic attack (TIA), and cerebral infarction without residual deficits: Secondary | ICD-10-CM | POA: Diagnosis not present

## 2022-03-30 DIAGNOSIS — I10 Essential (primary) hypertension: Secondary | ICD-10-CM

## 2022-03-30 DIAGNOSIS — M159 Polyosteoarthritis, unspecified: Secondary | ICD-10-CM

## 2022-03-30 NOTE — Progress Notes (Signed)
? ?Location:  [facility] ?Nursing Home Room Number: [id number] ?Place of Service:  SNF (31) ?Provider:  Kenard GowerMedina-Vargas, Jarmel Linhardt, DNP, FNP-BC ? ?Patient Care Team: ?Pecola LawlessHopper, William F, MD as PCP - General (Internal Medicine) ?Medina-Vargas, Margit BandaMonina C, NP as Nurse Practitioner (Internal Medicine) ? ?Extended Emergency Contact Information ?Primary Emergency Contact: Edwards,Sharon ? [place] ?Mobile Phone: [phone] ?Relation: Daughter ?Secondary Emergency Contact: parrot,edna ?Mobile Phone: [phone] ?Relation: Relative ? ?Code Status:  FULL ? ?Goals of care: Advanced Directive information ? ?  03/30/2022  ? 11:09 AM  ?Advanced Directives  ?Does Patient Have a Medical Advance Directive? Yes  ?Does patient want to make changes to medical advance directive? No - Patient declined  ?Would patient like information on creating a medical advance directive? No - Patient declined  ? ? ? ?Chief Complaint  ?Patient presents with  ? Medical Management of Chronic Issues  ?  Patient is here for a follow up for chronic conditions ?  ? ? ?HPI:  ?Pt is a 88 y.o. female seen today for medical management of chronic diseases.  She is a long-term care resident of [facility]. ? ?DM (diabetes mellitus), type 2 with peripheral vascular complications (HCC) -  not on any medications ? ?Primary hypertension -  SBPs ranging from 118 to 140, takes carvedilol 12.5 mg 1 tab twice daily ? ?History of CVA (cerebrovascular accident) - takes lovastatin 20 mg 1 tab at supper and aspirin 81 mg 1 tab daily ? ?Primary osteoarthritis involving multiple joints  - takes Arthritis Pain ER 650 mg 2 tabs = 1300 mg Q am and arthritis pain ER 650 mg 1 tab twice a day and Biofreeze 4% gel topically to bilateral knees down to ankle 3 times daily ? ? ?Past Medical History:  ?Diagnosis Date  ? GERD (gastroesophageal reflux disease)   ? Hyperlipemia   ? Hypertension   ? TIA (transient ischemic attack)   ? ?Past Surgical  History:  ?Procedure Laterality Date  ? ABDOMINAL HYSTERECTOMY    ? TONSILLECTOMY    ? ? ?Allergies  ?Allergen Reactions  ? Tape Other (See Comments)  ?  SKIN IS THIN- WILL BRUISE AND TEAR EASILY!!  ? Sulfa Antibiotics Hives  ? ? ?Outpatient Encounter Medications as of 03/30/2022  ?Medication Sig  ? acetaminophen (TYLENOL) 650 MG CR tablet Take 1,300 mg by mouth daily. Every morning  ? acetaminophen (TYLENOL) 650 MG CR tablet Take 650 mg by mouth 2 (two) times daily as needed for pain.  ? ammonium lactate (AMLACTIN) 12 % cream apply to both feet daily until resolved  ? ASPERCREME ARTHRITIS PAIN 1 % GEL Apply 2-4 g topically 4 (four) times daily as needed (for pain).  ? aspirin 81 MG chewable tablet Chew 81 mg by mouth daily at 3 pm.   ? BIOFREEZE 4 % GEL Apply 1 application topically in the morning, at noon, and at bedtime.  ? bisacodyl (DULCOLAX) 10 MG suppository as needed. If not relieved by MOM, give 10 mg Bisacodyl suppositiory rectally X 1 dose in 24 hours as needed  ? carvedilol (COREG) 12.5 MG tablet Take 12.5 mg by mouth 2 (two) times daily with a meal.  ? Loperamide HCl (IMODIUM A-D PO) Take by mouth. 4 mg p.o. after 1st loose stool as needed.  ? lovastatin (MEVACOR) 20 MG tablet Take 20 mg by mouth daily after supper.  ? magnesium hydroxide (MILK OF MAGNESIA) 400 MG/5ML suspension If no BM in 3 days, give 30 cc  Milk of Magnesium p.o. x 1 dose in 24 hours as needed (Do not use standing constipation orders for residents with renal failure CFR less than 30. Contact MD for orders) (Physician Order)  ? NON FORMULARY diet clarification - pureed ?Heart healthy diet  ? Nutritional Supplements (ENSURE PO) Take 237 mLs by mouth in the morning and at bedtime.  ? pantoprazole (PROTONIX) 40 MG tablet Take 1 tablet (40 mg total) by mouth daily.  ? polyethylene glycol (MIRALAX / GLYCOLAX) 17 g packet Take 17 g by mouth daily. 4-8 OZ OF WATER AND DRINK BY MOUTH ONCE DAILY AS NEEDED FOR CONSTIPATION  ?  Pramox-PE-Glycerin-Petrolatum (HEMORRHOIDAL EX) CREAM APPLY TOPICALLY TO EXTERNAL HEMORRHOIDS TWICE DAILY AS NEEDED - IRRITATION  ? sennosides-docusate sodium (SENOKOT-S) 8.6-50 MG tablet Take 2 tablets by mouth 2 (two) times daily.  ? Sodium Phosphates (RA SALINE ENEMA RE) Place rectally. If not relieved by Biscodyl suppository, give disposable Saline Enema rectally X 1 dose/24 hrs as needed  ? ?No facility-administered encounter medications on file as of 03/30/2022.  ? ? ?Review of Systems  ?Constitutional:  Negative for appetite change, chills, fatigue and fever.  ?HENT:  Negative for congestion, hearing loss, rhinorrhea and sore throat.   ?Eyes: Negative.   ?Respiratory:  Negative for cough, shortness of breath and wheezing.   ?Cardiovascular:  Negative for chest pain, palpitations and leg swelling.  ?Gastrointestinal:  Negative for abdominal pain, constipation, diarrhea, nausea and vomiting.  ?Genitourinary:  Negative for dysuria.  ?Musculoskeletal:  Negative for arthralgias, back pain and myalgias.  ?Skin:  Negative for color change, rash and wound.  ?Neurological:  Negative for dizziness, weakness and headaches.  ?Psychiatric/Behavioral:  Negative for behavioral problems. The patient is not nervous/anxious.    ? ? ? ?Immunization History  ?Administered Date(s) Administered  ? Influenza, High Dose Seasonal PF 08/27/2018  ? Influenza-Unspecified 08/27/2020  ? ?Pertinent  Health Maintenance Due  ?Topic Date Due  ? FOOT EXAM  Never done  ? OPHTHALMOLOGY EXAM  Never done  ? URINE MICROALBUMIN  Never done  ? DEXA SCAN  Never done  ? HEMOGLOBIN A1C  05/24/2022  ? INFLUENZA VACCINE  06/27/2022  ? ? ?  07/28/2021  ?  1:00 PM 07/28/2021  ?  9:00 PM 07/29/2021  ?  8:00 AM 07/29/2021  ?  8:00 PM 12/26/2021  ? 11:57 AM  ?Fall Risk  ?Falls in the past year?     0  ?Was there an injury with Fall?     0  ?Fall Risk Category Calculator     0  ?Fall Risk Category     Low  ?Patient Fall Risk Level High fall risk Moderate fall risk  Moderate fall risk Moderate fall risk   ?Fall risk Follow up     Falls prevention discussed;Education provided  ? ? ? ?Vitals:  ? 03/30/22 1106  ?BP: 131/62  ?Pulse: 71  ?Resp: 20  ?Temp: (!) 97.2 ?F (36.2 ?C)  ?Weight: 175 lb (79.4 kg)  ?Height: 5\' 2"  (1.575 m)  ? ?Body mass index is 32.01 kg/m?. ? ?Physical Exam ?Constitutional:   ?   Appearance: Normal appearance.  ?HENT:  ?   Head: Normocephalic and atraumatic.  ?   Nose: Nose normal.  ?   Mouth/Throat:  ?   Mouth: Mucous membranes are moist.  ?Eyes:  ?   Conjunctiva/sclera: Conjunctivae normal.  ?Cardiovascular:  ?   Rate and Rhythm: Normal rate and regular rhythm.  ?Pulmonary:  ?   Effort:  Pulmonary effort is normal.  ?   Breath sounds: Normal breath sounds.  ?Abdominal:  ?   General: Bowel sounds are normal.  ?   Palpations: Abdomen is soft.  ?Musculoskeletal:  ?   Cervical back: Normal range of motion.  ?Skin: ?   General: Skin is warm and dry.  ?Neurological:  ?   General: No focal deficit present.  ?   Mental Status: She is alert and oriented to person, place, and time.  ?Psychiatric:     ?   Mood and Affect: Mood normal.     ?   Behavior: Behavior normal.     ?   Thought Content: Thought content normal.     ?   Judgment: Judgment normal.  ?  ? ? ? ?Labs reviewed: ?Recent Labs  ?  07/23/21 ?0241 07/27/21 ?1530 07/27/21 ?1535 08/03/21 ?0000 11/02/21 ?0000  ?NA 129* 133* 134* 135* 140  ?K 4.1 3.9 3.8 4.9 4.7  ?CL 91* 103 102 96* 97*  ?CO2 27 23  --  27* 22  ?GLUCOSE 111* 108* 103*  --   --   ?BUN 25* 33* 31* 25* 29*  ?CREATININE 0.82 0.82 0.90 0.9 1.0  ?CALCIUM 10.3 8.2*  --  9.4 9.7  ? ?Recent Labs  ?  07/23/21 ?0241 07/27/21 ?1530 08/03/21 ?0000 11/02/21 ?0000  ?AST 17 15 14  12*  ?ALT 14 12 8 8   ?ALKPHOS 75 57 75 78  ?BILITOT 0.9 0.6  --   --   ?PROT 7.0 6.1*  --   --   ?ALBUMIN 3.3* 2.9* 3.5 3.5  ? ?Recent Labs  ?  07/23/21 ?0241 07/27/21 ?1530 07/27/21 ?1535 08/03/21 ?0000 11/02/21 ?0000  ?WBC 8.6 7.6  --  7.8 7.7  ?NEUTROABS 5.5 5.2  --   --   --   ?HGB  11.5* 10.0* 10.2* 10.6* 10.5*  ?HCT 36.6 32.9* 30.0* 32* 34*  ?MCV 96.8 100.6*  --   --   --   ?PLT 270 264  --  269 287  ? ?Lab Results  ?Component Value Date  ? TSH 2.75 11/02/2021  ? ?Lab Results  ?Component Value Date  ? HGBA1C

## 2022-04-05 DIAGNOSIS — Z961 Presence of intraocular lens: Secondary | ICD-10-CM | POA: Diagnosis not present

## 2022-04-05 DIAGNOSIS — H524 Presbyopia: Secondary | ICD-10-CM | POA: Diagnosis not present

## 2022-04-17 DIAGNOSIS — M25562 Pain in left knee: Secondary | ICD-10-CM | POA: Diagnosis not present

## 2022-04-17 DIAGNOSIS — M6281 Muscle weakness (generalized): Secondary | ICD-10-CM | POA: Diagnosis not present

## 2022-04-17 DIAGNOSIS — M1711 Unilateral primary osteoarthritis, right knee: Secondary | ICD-10-CM | POA: Diagnosis not present

## 2022-04-17 DIAGNOSIS — M25561 Pain in right knee: Secondary | ICD-10-CM | POA: Diagnosis not present

## 2022-04-17 DIAGNOSIS — M1712 Unilateral primary osteoarthritis, left knee: Secondary | ICD-10-CM | POA: Diagnosis not present

## 2022-04-18 DIAGNOSIS — M6281 Muscle weakness (generalized): Secondary | ICD-10-CM | POA: Diagnosis not present

## 2022-04-18 DIAGNOSIS — M25561 Pain in right knee: Secondary | ICD-10-CM | POA: Diagnosis not present

## 2022-04-18 DIAGNOSIS — M1711 Unilateral primary osteoarthritis, right knee: Secondary | ICD-10-CM | POA: Diagnosis not present

## 2022-04-18 DIAGNOSIS — M1712 Unilateral primary osteoarthritis, left knee: Secondary | ICD-10-CM | POA: Diagnosis not present

## 2022-04-18 DIAGNOSIS — M25562 Pain in left knee: Secondary | ICD-10-CM | POA: Diagnosis not present

## 2022-04-19 DIAGNOSIS — M1712 Unilateral primary osteoarthritis, left knee: Secondary | ICD-10-CM | POA: Diagnosis not present

## 2022-04-19 DIAGNOSIS — M25562 Pain in left knee: Secondary | ICD-10-CM | POA: Diagnosis not present

## 2022-04-19 DIAGNOSIS — M1711 Unilateral primary osteoarthritis, right knee: Secondary | ICD-10-CM | POA: Diagnosis not present

## 2022-04-19 DIAGNOSIS — M6281 Muscle weakness (generalized): Secondary | ICD-10-CM | POA: Diagnosis not present

## 2022-04-19 DIAGNOSIS — M25561 Pain in right knee: Secondary | ICD-10-CM | POA: Diagnosis not present

## 2022-04-20 DIAGNOSIS — M6281 Muscle weakness (generalized): Secondary | ICD-10-CM | POA: Diagnosis not present

## 2022-04-20 DIAGNOSIS — M25562 Pain in left knee: Secondary | ICD-10-CM | POA: Diagnosis not present

## 2022-04-20 DIAGNOSIS — M25561 Pain in right knee: Secondary | ICD-10-CM | POA: Diagnosis not present

## 2022-04-20 DIAGNOSIS — M1712 Unilateral primary osteoarthritis, left knee: Secondary | ICD-10-CM | POA: Diagnosis not present

## 2022-04-20 DIAGNOSIS — M1711 Unilateral primary osteoarthritis, right knee: Secondary | ICD-10-CM | POA: Diagnosis not present

## 2022-04-21 DIAGNOSIS — M1711 Unilateral primary osteoarthritis, right knee: Secondary | ICD-10-CM | POA: Diagnosis not present

## 2022-04-21 DIAGNOSIS — M25561 Pain in right knee: Secondary | ICD-10-CM | POA: Diagnosis not present

## 2022-04-21 DIAGNOSIS — M6281 Muscle weakness (generalized): Secondary | ICD-10-CM | POA: Diagnosis not present

## 2022-04-21 DIAGNOSIS — M1712 Unilateral primary osteoarthritis, left knee: Secondary | ICD-10-CM | POA: Diagnosis not present

## 2022-04-21 DIAGNOSIS — M25562 Pain in left knee: Secondary | ICD-10-CM | POA: Diagnosis not present

## 2022-04-24 DIAGNOSIS — M25561 Pain in right knee: Secondary | ICD-10-CM | POA: Diagnosis not present

## 2022-04-24 DIAGNOSIS — M6281 Muscle weakness (generalized): Secondary | ICD-10-CM | POA: Diagnosis not present

## 2022-04-24 DIAGNOSIS — M25562 Pain in left knee: Secondary | ICD-10-CM | POA: Diagnosis not present

## 2022-04-24 DIAGNOSIS — M1711 Unilateral primary osteoarthritis, right knee: Secondary | ICD-10-CM | POA: Diagnosis not present

## 2022-04-24 DIAGNOSIS — M1712 Unilateral primary osteoarthritis, left knee: Secondary | ICD-10-CM | POA: Diagnosis not present

## 2022-04-25 DIAGNOSIS — M25561 Pain in right knee: Secondary | ICD-10-CM | POA: Diagnosis not present

## 2022-04-25 DIAGNOSIS — M25562 Pain in left knee: Secondary | ICD-10-CM | POA: Diagnosis not present

## 2022-04-25 DIAGNOSIS — M6281 Muscle weakness (generalized): Secondary | ICD-10-CM | POA: Diagnosis not present

## 2022-04-25 DIAGNOSIS — M1711 Unilateral primary osteoarthritis, right knee: Secondary | ICD-10-CM | POA: Diagnosis not present

## 2022-04-25 DIAGNOSIS — M1712 Unilateral primary osteoarthritis, left knee: Secondary | ICD-10-CM | POA: Diagnosis not present

## 2022-04-26 DIAGNOSIS — M25561 Pain in right knee: Secondary | ICD-10-CM | POA: Diagnosis not present

## 2022-04-26 DIAGNOSIS — M6281 Muscle weakness (generalized): Secondary | ICD-10-CM | POA: Diagnosis not present

## 2022-04-26 DIAGNOSIS — M1711 Unilateral primary osteoarthritis, right knee: Secondary | ICD-10-CM | POA: Diagnosis not present

## 2022-04-26 DIAGNOSIS — M25562 Pain in left knee: Secondary | ICD-10-CM | POA: Diagnosis not present

## 2022-04-26 DIAGNOSIS — M1712 Unilateral primary osteoarthritis, left knee: Secondary | ICD-10-CM | POA: Diagnosis not present

## 2022-04-27 DIAGNOSIS — M6281 Muscle weakness (generalized): Secondary | ICD-10-CM | POA: Diagnosis not present

## 2022-04-27 DIAGNOSIS — M1711 Unilateral primary osteoarthritis, right knee: Secondary | ICD-10-CM | POA: Diagnosis not present

## 2022-04-27 DIAGNOSIS — M172 Bilateral post-traumatic osteoarthritis of knee: Secondary | ICD-10-CM | POA: Diagnosis not present

## 2022-04-27 DIAGNOSIS — M25562 Pain in left knee: Secondary | ICD-10-CM | POA: Diagnosis not present

## 2022-04-27 DIAGNOSIS — M25561 Pain in right knee: Secondary | ICD-10-CM | POA: Diagnosis not present

## 2022-04-28 DIAGNOSIS — M172 Bilateral post-traumatic osteoarthritis of knee: Secondary | ICD-10-CM | POA: Diagnosis not present

## 2022-04-28 DIAGNOSIS — M25562 Pain in left knee: Secondary | ICD-10-CM | POA: Diagnosis not present

## 2022-04-28 DIAGNOSIS — M25561 Pain in right knee: Secondary | ICD-10-CM | POA: Diagnosis not present

## 2022-04-28 DIAGNOSIS — M1711 Unilateral primary osteoarthritis, right knee: Secondary | ICD-10-CM | POA: Diagnosis not present

## 2022-04-28 DIAGNOSIS — M6281 Muscle weakness (generalized): Secondary | ICD-10-CM | POA: Diagnosis not present

## 2022-05-01 DIAGNOSIS — M25561 Pain in right knee: Secondary | ICD-10-CM | POA: Diagnosis not present

## 2022-05-01 DIAGNOSIS — M172 Bilateral post-traumatic osteoarthritis of knee: Secondary | ICD-10-CM | POA: Diagnosis not present

## 2022-05-01 DIAGNOSIS — M1711 Unilateral primary osteoarthritis, right knee: Secondary | ICD-10-CM | POA: Diagnosis not present

## 2022-05-01 DIAGNOSIS — M6281 Muscle weakness (generalized): Secondary | ICD-10-CM | POA: Diagnosis not present

## 2022-05-01 DIAGNOSIS — M25562 Pain in left knee: Secondary | ICD-10-CM | POA: Diagnosis not present

## 2022-05-02 DIAGNOSIS — M172 Bilateral post-traumatic osteoarthritis of knee: Secondary | ICD-10-CM | POA: Diagnosis not present

## 2022-05-02 DIAGNOSIS — M25562 Pain in left knee: Secondary | ICD-10-CM | POA: Diagnosis not present

## 2022-05-02 DIAGNOSIS — M6281 Muscle weakness (generalized): Secondary | ICD-10-CM | POA: Diagnosis not present

## 2022-05-02 DIAGNOSIS — M1711 Unilateral primary osteoarthritis, right knee: Secondary | ICD-10-CM | POA: Diagnosis not present

## 2022-05-02 DIAGNOSIS — M25561 Pain in right knee: Secondary | ICD-10-CM | POA: Diagnosis not present

## 2022-05-03 DIAGNOSIS — M25562 Pain in left knee: Secondary | ICD-10-CM | POA: Diagnosis not present

## 2022-05-03 DIAGNOSIS — M172 Bilateral post-traumatic osteoarthritis of knee: Secondary | ICD-10-CM | POA: Diagnosis not present

## 2022-05-03 DIAGNOSIS — M1711 Unilateral primary osteoarthritis, right knee: Secondary | ICD-10-CM | POA: Diagnosis not present

## 2022-05-03 DIAGNOSIS — M6281 Muscle weakness (generalized): Secondary | ICD-10-CM | POA: Diagnosis not present

## 2022-05-03 DIAGNOSIS — M25561 Pain in right knee: Secondary | ICD-10-CM | POA: Diagnosis not present

## 2022-05-04 DIAGNOSIS — M6281 Muscle weakness (generalized): Secondary | ICD-10-CM | POA: Diagnosis not present

## 2022-05-04 DIAGNOSIS — M172 Bilateral post-traumatic osteoarthritis of knee: Secondary | ICD-10-CM | POA: Diagnosis not present

## 2022-05-04 DIAGNOSIS — M1711 Unilateral primary osteoarthritis, right knee: Secondary | ICD-10-CM | POA: Diagnosis not present

## 2022-05-04 DIAGNOSIS — M25562 Pain in left knee: Secondary | ICD-10-CM | POA: Diagnosis not present

## 2022-05-04 DIAGNOSIS — M25561 Pain in right knee: Secondary | ICD-10-CM | POA: Diagnosis not present

## 2022-05-05 DIAGNOSIS — M6281 Muscle weakness (generalized): Secondary | ICD-10-CM | POA: Diagnosis not present

## 2022-05-05 DIAGNOSIS — M172 Bilateral post-traumatic osteoarthritis of knee: Secondary | ICD-10-CM | POA: Diagnosis not present

## 2022-05-05 DIAGNOSIS — M1711 Unilateral primary osteoarthritis, right knee: Secondary | ICD-10-CM | POA: Diagnosis not present

## 2022-05-05 DIAGNOSIS — M25562 Pain in left knee: Secondary | ICD-10-CM | POA: Diagnosis not present

## 2022-05-05 DIAGNOSIS — M25561 Pain in right knee: Secondary | ICD-10-CM | POA: Diagnosis not present

## 2022-05-08 DIAGNOSIS — M25562 Pain in left knee: Secondary | ICD-10-CM | POA: Diagnosis not present

## 2022-05-08 DIAGNOSIS — M1711 Unilateral primary osteoarthritis, right knee: Secondary | ICD-10-CM | POA: Diagnosis not present

## 2022-05-08 DIAGNOSIS — M172 Bilateral post-traumatic osteoarthritis of knee: Secondary | ICD-10-CM | POA: Diagnosis not present

## 2022-05-08 DIAGNOSIS — M25561 Pain in right knee: Secondary | ICD-10-CM | POA: Diagnosis not present

## 2022-05-08 DIAGNOSIS — M6281 Muscle weakness (generalized): Secondary | ICD-10-CM | POA: Diagnosis not present

## 2022-05-09 DIAGNOSIS — M6281 Muscle weakness (generalized): Secondary | ICD-10-CM | POA: Diagnosis not present

## 2022-05-09 DIAGNOSIS — M25561 Pain in right knee: Secondary | ICD-10-CM | POA: Diagnosis not present

## 2022-05-09 DIAGNOSIS — M172 Bilateral post-traumatic osteoarthritis of knee: Secondary | ICD-10-CM | POA: Diagnosis not present

## 2022-05-09 DIAGNOSIS — M25562 Pain in left knee: Secondary | ICD-10-CM | POA: Diagnosis not present

## 2022-05-09 DIAGNOSIS — M1711 Unilateral primary osteoarthritis, right knee: Secondary | ICD-10-CM | POA: Diagnosis not present

## 2022-05-10 DIAGNOSIS — M172 Bilateral post-traumatic osteoarthritis of knee: Secondary | ICD-10-CM | POA: Diagnosis not present

## 2022-05-10 DIAGNOSIS — M25562 Pain in left knee: Secondary | ICD-10-CM | POA: Diagnosis not present

## 2022-05-10 DIAGNOSIS — M25561 Pain in right knee: Secondary | ICD-10-CM | POA: Diagnosis not present

## 2022-05-10 DIAGNOSIS — M1711 Unilateral primary osteoarthritis, right knee: Secondary | ICD-10-CM | POA: Diagnosis not present

## 2022-05-10 DIAGNOSIS — M6281 Muscle weakness (generalized): Secondary | ICD-10-CM | POA: Diagnosis not present

## 2022-05-11 DIAGNOSIS — M1710 Unilateral primary osteoarthritis, unspecified knee: Secondary | ICD-10-CM | POA: Diagnosis not present

## 2022-05-11 DIAGNOSIS — M6281 Muscle weakness (generalized): Secondary | ICD-10-CM | POA: Diagnosis not present

## 2022-05-11 DIAGNOSIS — K219 Gastro-esophageal reflux disease without esophagitis: Secondary | ICD-10-CM | POA: Diagnosis not present

## 2022-05-11 DIAGNOSIS — E785 Hyperlipidemia, unspecified: Secondary | ICD-10-CM | POA: Diagnosis not present

## 2022-05-29 DIAGNOSIS — M1710 Unilateral primary osteoarthritis, unspecified knee: Secondary | ICD-10-CM | POA: Diagnosis not present

## 2022-06-01 DIAGNOSIS — M179 Osteoarthritis of knee, unspecified: Secondary | ICD-10-CM | POA: Diagnosis not present

## 2022-07-04 DIAGNOSIS — K219 Gastro-esophageal reflux disease without esophagitis: Secondary | ICD-10-CM | POA: Diagnosis not present

## 2022-07-04 DIAGNOSIS — E785 Hyperlipidemia, unspecified: Secondary | ICD-10-CM | POA: Diagnosis not present

## 2022-07-04 DIAGNOSIS — M1710 Unilateral primary osteoarthritis, unspecified knee: Secondary | ICD-10-CM | POA: Diagnosis not present

## 2022-07-04 DIAGNOSIS — M6281 Muscle weakness (generalized): Secondary | ICD-10-CM | POA: Diagnosis not present

## 2022-07-05 ENCOUNTER — Emergency Department (HOSPITAL_COMMUNITY): Payer: Medicare Other

## 2022-07-05 ENCOUNTER — Inpatient Hospital Stay (HOSPITAL_COMMUNITY)
Admission: EM | Admit: 2022-07-05 | Discharge: 2022-07-09 | DRG: 445 | Disposition: A | Discharge status: Skilled Nursing Facility | Payer: Medicare Other | Source: Skilled Nursing Facility | Attending: Family Medicine | Admitting: Family Medicine

## 2022-07-05 ENCOUNTER — Encounter (HOSPITAL_COMMUNITY): Payer: Self-pay

## 2022-07-05 ENCOUNTER — Observation Stay (HOSPITAL_COMMUNITY): Payer: Medicare Other

## 2022-07-05 DIAGNOSIS — E1122 Type 2 diabetes mellitus with diabetic chronic kidney disease: Secondary | ICD-10-CM | POA: Diagnosis present

## 2022-07-05 DIAGNOSIS — K222 Esophageal obstruction: Secondary | ICD-10-CM | POA: Diagnosis not present

## 2022-07-05 DIAGNOSIS — K219 Gastro-esophageal reflux disease without esophagitis: Secondary | ICD-10-CM | POA: Diagnosis present

## 2022-07-05 DIAGNOSIS — I639 Cerebral infarction, unspecified: Secondary | ICD-10-CM | POA: Diagnosis not present

## 2022-07-05 DIAGNOSIS — N183 Chronic kidney disease, stage 3 unspecified: Secondary | ICD-10-CM | POA: Diagnosis present

## 2022-07-05 DIAGNOSIS — Z87891 Personal history of nicotine dependence: Secondary | ICD-10-CM

## 2022-07-05 DIAGNOSIS — M170 Bilateral primary osteoarthritis of knee: Secondary | ICD-10-CM | POA: Diagnosis present

## 2022-07-05 DIAGNOSIS — R55 Syncope and collapse: Secondary | ICD-10-CM | POA: Diagnosis not present

## 2022-07-05 DIAGNOSIS — Z741 Need for assistance with personal care: Secondary | ICD-10-CM | POA: Diagnosis not present

## 2022-07-05 DIAGNOSIS — Z8249 Family history of ischemic heart disease and other diseases of the circulatory system: Secondary | ICD-10-CM | POA: Diagnosis not present

## 2022-07-05 DIAGNOSIS — K5710 Diverticulosis of small intestine without perforation or abscess without bleeding: Secondary | ICD-10-CM | POA: Diagnosis not present

## 2022-07-05 DIAGNOSIS — K5750 Diverticulosis of both small and large intestine without perforation or abscess without bleeding: Secondary | ICD-10-CM | POA: Diagnosis present

## 2022-07-05 DIAGNOSIS — K8050 Calculus of bile duct without cholangitis or cholecystitis without obstruction: Secondary | ICD-10-CM | POA: Insufficient documentation

## 2022-07-05 DIAGNOSIS — G959 Disease of spinal cord, unspecified: Secondary | ICD-10-CM | POA: Diagnosis present

## 2022-07-05 DIAGNOSIS — R2681 Unsteadiness on feet: Secondary | ICD-10-CM | POA: Diagnosis not present

## 2022-07-05 DIAGNOSIS — K5900 Constipation, unspecified: Secondary | ICD-10-CM

## 2022-07-05 DIAGNOSIS — Z8673 Personal history of transient ischemic attack (TIA), and cerebral infarction without residual deficits: Secondary | ICD-10-CM

## 2022-07-05 DIAGNOSIS — K5730 Diverticulosis of large intestine without perforation or abscess without bleeding: Secondary | ICD-10-CM | POA: Diagnosis not present

## 2022-07-05 DIAGNOSIS — Z791 Long term (current) use of non-steroidal anti-inflammatories (NSAID): Secondary | ICD-10-CM | POA: Diagnosis not present

## 2022-07-05 DIAGNOSIS — N289 Disorder of kidney and ureter, unspecified: Secondary | ICD-10-CM | POA: Diagnosis not present

## 2022-07-05 DIAGNOSIS — I10 Essential (primary) hypertension: Secondary | ICD-10-CM | POA: Diagnosis present

## 2022-07-05 DIAGNOSIS — K449 Diaphragmatic hernia without obstruction or gangrene: Secondary | ICD-10-CM | POA: Diagnosis present

## 2022-07-05 DIAGNOSIS — R748 Abnormal levels of other serum enzymes: Secondary | ICD-10-CM

## 2022-07-05 DIAGNOSIS — R41841 Cognitive communication deficit: Secondary | ICD-10-CM | POA: Diagnosis not present

## 2022-07-05 DIAGNOSIS — N179 Acute kidney failure, unspecified: Secondary | ICD-10-CM | POA: Diagnosis present

## 2022-07-05 DIAGNOSIS — Z882 Allergy status to sulfonamides status: Secondary | ICD-10-CM

## 2022-07-05 DIAGNOSIS — Z79899 Other long term (current) drug therapy: Secondary | ICD-10-CM | POA: Diagnosis not present

## 2022-07-05 DIAGNOSIS — K8070 Calculus of gallbladder and bile duct without cholecystitis without obstruction: Secondary | ICD-10-CM | POA: Diagnosis present

## 2022-07-05 DIAGNOSIS — D649 Anemia, unspecified: Secondary | ICD-10-CM

## 2022-07-05 DIAGNOSIS — E785 Hyperlipidemia, unspecified: Secondary | ICD-10-CM | POA: Diagnosis present

## 2022-07-05 DIAGNOSIS — I517 Cardiomegaly: Secondary | ICD-10-CM | POA: Diagnosis not present

## 2022-07-05 DIAGNOSIS — Z83438 Family history of other disorder of lipoprotein metabolism and other lipidemia: Secondary | ICD-10-CM | POA: Diagnosis not present

## 2022-07-05 DIAGNOSIS — I129 Hypertensive chronic kidney disease with stage 1 through stage 4 chronic kidney disease, or unspecified chronic kidney disease: Secondary | ICD-10-CM | POA: Diagnosis present

## 2022-07-05 DIAGNOSIS — Z7982 Long term (current) use of aspirin: Secondary | ICD-10-CM | POA: Diagnosis not present

## 2022-07-05 DIAGNOSIS — R4182 Altered mental status, unspecified: Secondary | ICD-10-CM | POA: Diagnosis not present

## 2022-07-05 DIAGNOSIS — D631 Anemia in chronic kidney disease: Secondary | ICD-10-CM | POA: Diagnosis present

## 2022-07-05 DIAGNOSIS — N281 Cyst of kidney, acquired: Secondary | ICD-10-CM | POA: Diagnosis not present

## 2022-07-05 DIAGNOSIS — R93429 Abnormal radiologic findings on diagnostic imaging of unspecified kidney: Secondary | ICD-10-CM

## 2022-07-05 DIAGNOSIS — K838 Other specified diseases of biliary tract: Secondary | ICD-10-CM | POA: Diagnosis not present

## 2022-07-05 DIAGNOSIS — W19XXXA Unspecified fall, initial encounter: Secondary | ICD-10-CM | POA: Diagnosis not present

## 2022-07-05 DIAGNOSIS — K5909 Other constipation: Secondary | ICD-10-CM | POA: Diagnosis present

## 2022-07-05 DIAGNOSIS — E669 Obesity, unspecified: Secondary | ICD-10-CM

## 2022-07-05 DIAGNOSIS — Z91048 Other nonmedicinal substance allergy status: Secondary | ICD-10-CM | POA: Diagnosis not present

## 2022-07-05 DIAGNOSIS — I2510 Atherosclerotic heart disease of native coronary artery without angina pectoris: Secondary | ICD-10-CM | POA: Diagnosis not present

## 2022-07-05 DIAGNOSIS — R7989 Other specified abnormal findings of blood chemistry: Secondary | ICD-10-CM | POA: Diagnosis not present

## 2022-07-05 DIAGNOSIS — I700 Atherosclerosis of aorta: Secondary | ICD-10-CM | POA: Diagnosis not present

## 2022-07-05 DIAGNOSIS — K8020 Calculus of gallbladder without cholecystitis without obstruction: Secondary | ICD-10-CM | POA: Diagnosis not present

## 2022-07-05 DIAGNOSIS — I679 Cerebrovascular disease, unspecified: Secondary | ICD-10-CM | POA: Diagnosis not present

## 2022-07-05 DIAGNOSIS — M6281 Muscle weakness (generalized): Secondary | ICD-10-CM | POA: Diagnosis not present

## 2022-07-05 DIAGNOSIS — I959 Hypotension, unspecified: Secondary | ICD-10-CM | POA: Diagnosis not present

## 2022-07-05 DIAGNOSIS — I69391 Dysphagia following cerebral infarction: Secondary | ICD-10-CM | POA: Diagnosis not present

## 2022-07-05 DIAGNOSIS — R1013 Epigastric pain: Secondary | ICD-10-CM | POA: Diagnosis not present

## 2022-07-05 LAB — COMPREHENSIVE METABOLIC PANEL
ALT: 22 U/L (ref 0–44)
AST: 37 U/L (ref 15–41)
Albumin: 3.2 g/dL — ABNORMAL LOW (ref 3.5–5.0)
Alkaline Phosphatase: 66 U/L (ref 38–126)
Anion gap: 10 (ref 5–15)
BUN: 36 mg/dL — ABNORMAL HIGH (ref 8–23)
CO2: 25 mmol/L (ref 22–32)
Calcium: 9.4 mg/dL (ref 8.9–10.3)
Chloride: 95 mmol/L — ABNORMAL LOW (ref 98–111)
Creatinine, Ser: 1.25 mg/dL — ABNORMAL HIGH (ref 0.44–1.00)
GFR, Estimated: 41 mL/min — ABNORMAL LOW (ref >60)
Glucose, Bld: 149 mg/dL — ABNORMAL HIGH (ref 70–99)
Potassium: 5.2 mmol/L — ABNORMAL HIGH (ref 3.5–5.1)
Sodium: 130 mmol/L — ABNORMAL LOW (ref 135–145)
Total Bilirubin: 0.6 mg/dL (ref 0.3–1.2)
Total Protein: 6.8 g/dL (ref 6.5–8.1)

## 2022-07-05 LAB — CBC WITH DIFFERENTIAL/PLATELET
Abs Immature Granulocytes: 0.08 10*3/uL — ABNORMAL HIGH (ref 0.00–0.07)
Basophils Absolute: 0 10*3/uL (ref 0.0–0.1)
Basophils Relative: 0 %
Eosinophils Absolute: 0.2 10*3/uL (ref 0.0–0.5)
Eosinophils Relative: 2 %
HCT: 36.6 % (ref 36.0–46.0)
Hemoglobin: 11.6 g/dL — ABNORMAL LOW (ref 12.0–15.0)
Immature Granulocytes: 1 %
Lymphocytes Relative: 11 %
Lymphs Abs: 1.1 10*3/uL (ref 0.7–4.0)
MCH: 31.7 pg (ref 26.0–34.0)
MCHC: 31.7 g/dL (ref 30.0–36.0)
MCV: 100 fL (ref 80.0–100.0)
Monocytes Absolute: 0.8 10*3/uL (ref 0.1–1.0)
Monocytes Relative: 8 %
Neutro Abs: 7.7 10*3/uL (ref 1.7–7.7)
Neutrophils Relative %: 78 %
Platelets: 243 10*3/uL (ref 150–400)
RBC: 3.66 MIL/uL — ABNORMAL LOW (ref 3.87–5.11)
RDW: 13.6 % (ref 11.5–15.5)
WBC: 9.9 10*3/uL (ref 4.0–10.5)
nRBC: 0 % (ref 0.0–0.2)

## 2022-07-05 LAB — TROPONIN I (HIGH SENSITIVITY)
Troponin I (High Sensitivity): 9 ng/L (ref <18)
Troponin I (High Sensitivity): 8 ng/L (ref <18)

## 2022-07-05 LAB — BRAIN NATRIURETIC PEPTIDE: B Natriuretic Peptide: 71.4 pg/mL (ref 0.0–100.0)

## 2022-07-05 LAB — LIPASE, BLOOD: Lipase: 1568 U/L — ABNORMAL HIGH (ref 11–51)

## 2022-07-05 LAB — D-DIMER, QUANTITATIVE: D-Dimer, Quant: 2.42 ug/mL-FEU — ABNORMAL HIGH (ref 0.00–0.50)

## 2022-07-05 MED ORDER — ACETAMINOPHEN 325 MG PO TABS
650.0000 mg | ORAL_TABLET | Freq: Four times a day (QID) | ORAL | Status: DC | PRN
  Administered 2022-07-06 – 2022-07-09 (×8): 650 mg via ORAL
  Filled 2022-07-05 (×8): qty 2

## 2022-07-05 MED ORDER — ENOXAPARIN SODIUM 30 MG/0.3ML IJ SOSY
30.0000 mg | PREFILLED_SYRINGE | INTRAMUSCULAR | Status: DC
  Administered 2022-07-06: 30 mg via SUBCUTANEOUS
  Filled 2022-07-05: qty 0.3

## 2022-07-05 MED ORDER — CARVEDILOL 12.5 MG PO TABS: 12.5000 mg | ORAL_TABLET | Freq: Two times a day (BID) | ORAL | Status: DC

## 2022-07-05 MED ORDER — ASPIRIN 81 MG PO CHEW
81.0000 mg | CHEWABLE_TABLET | Freq: Every day | ORAL | Status: DC
  Administered 2022-07-06 – 2022-07-08 (×2): 81 mg via ORAL
  Filled 2022-07-05 (×2): qty 1

## 2022-07-05 MED ORDER — PANTOPRAZOLE SODIUM 20 MG PO TBEC
20.0000 mg | DELAYED_RELEASE_TABLET | Freq: Every day | ORAL | Status: DC
  Administered 2022-07-06 – 2022-07-09 (×4): 20 mg via ORAL
  Filled 2022-07-05 (×4): qty 1

## 2022-07-05 MED ORDER — IOHEXOL 350 MG/ML SOLN
80.0000 mL | Freq: Once | INTRAVENOUS | Status: AC | PRN
  Administered 2022-07-05: 80 mL via INTRAVENOUS

## 2022-07-05 MED ORDER — PRAVASTATIN SODIUM 40 MG PO TABS
20.0000 mg | ORAL_TABLET | Freq: Every day | ORAL | Status: DC
  Administered 2022-07-06 – 2022-07-08 (×3): 20 mg via ORAL
  Filled 2022-07-05 (×4): qty 1

## 2022-07-05 MED ORDER — ACETAMINOPHEN 500 MG PO TABS
1000.0000 mg | ORAL_TABLET | Freq: Once | ORAL | Status: AC
  Administered 2022-07-05: 500 mg via ORAL
  Filled 2022-07-05: qty 2

## 2022-07-05 MED ORDER — SODIUM CHLORIDE 0.9 % IV BOLUS
500.0000 mL | Freq: Once | INTRAVENOUS | Status: AC
  Administered 2022-07-05: 500 mL via INTRAVENOUS

## 2022-07-05 MED ORDER — ACETAMINOPHEN 650 MG RE SUPP: 650.0000 mg | Freq: Four times a day (QID) | RECTAL | Status: DC | PRN

## 2022-07-05 MED ORDER — CARVEDILOL 12.5 MG PO TABS
12.5000 mg | ORAL_TABLET | Freq: Two times a day (BID) | ORAL | Status: DC
  Administered 2022-07-05 – 2022-07-09 (×8): 12.5 mg via ORAL
  Filled 2022-07-05 (×8): qty 1

## 2022-07-05 NOTE — Progress Notes (Signed)
Patient in imaging right now. Tech on standby in room.

## 2022-07-05 NOTE — ED Triage Notes (Signed)
Lives in [place] and [place]. Abdominal pain and constipation x 3 days. Reports taking a laxative this AM with 1 BM. Pt was in the bathroom when she had a syncopal episode unwitnessed. Staff started doing CPR. Pt was awake when CPR was administered. Reports abdominal pain and distention. Alert and oriented x 4.

## 2022-07-05 NOTE — H&P (Signed)
History and Physical    Jill Hernandez AOZ:[id number] DOB: Jan 09, 1930 DOA: 07/05/2022  PCP: Marinda Elk, MD  Patient coming from: Skilled nursing facility.  Chief Complaint: Loss of consciousness.  HPI: Jill Hernandez is a 88 y.o. female with history of CVA in September 2022, hypertension, chronic anemia, hyperlipidemia was brought to the ER after patient had a brief episode of loss of consciousness.  Patient was complaining of abdominal discomfort for the last 3 days with abdominal distention.  While straining in the bathroom patient lost consciousness and the staff at the healthcare facility had to do CPR briefly.  Patient was brought to the ER.  Patient does not recall incident except going to the bathroom.  ED Course: In the ER patient had a CT head followed by CT chest abdomen pelvis which is showing nothing acute except for bilateral duct dilatation.  Labs show markedly elevated lipase.  At the time of my exam patient does not have any chest pain or abdominal pain.  EKG shows normal sinus rhythm with a QTc of 451 ms.  High sensitive troponins were negative.  Review of Systems: As per HPI, rest all negative.   Past Medical History:  Diagnosis Date   GERD (gastroesophageal reflux disease)    Hyperlipemia    Hypertension    TIA (transient ischemic attack)     Past Surgical History:  Procedure Laterality Date   ABDOMINAL HYSTERECTOMY     TONSILLECTOMY       reports that she quit smoking about 59 years ago. Her smoking use included cigarettes. She has never used smokeless tobacco. She reports that she does not drink alcohol and does not use drugs.  Allergies  Allergen Reactions   Tape Other (See Comments)    SKIN IS THIN- WILL BRUISE AND TEAR EASILY!!   Sulfa Antibiotics Hives    Family History  Problem Relation Age of Onset   Hypertension Mother    Hyperlipidemia Father    Hypertension Father     Prior to Admission medications   Medication Sig Start Date  End Date Taking? Authorizing Provider  acetaminophen (TYLENOL) 650 MG CR tablet Take 650 mg by mouth in the morning, at noon, and at bedtime.   Yes [provider]  ammonium lactate (AMLACTIN) 12 % cream apply to both feet daily until resolved   Yes [provider]  ASPERCREME ARTHRITIS PAIN 1 % GEL Apply 2-4 g topically 4 (four) times daily as needed (for pain).   Yes [provider]  aspirin 81 MG chewable tablet Chew 81 mg by mouth daily at 3 pm.    Yes [provider]  BIOFREEZE 4 % GEL Apply 1 application topically in the morning, at noon, and at bedtime.   Yes [provider]  carvedilol (COREG) 12.5 MG tablet Take 12.5 mg by mouth 2 (two) times daily with a meal. 02/07/19  Yes [provider]  lovastatin (MEVACOR) 20 MG tablet Take 20 mg by mouth daily after supper.   Yes [provider]  meloxicam (MOBIC) 15 MG tablet Take 15 mg by mouth daily.   Yes [provider]  NON FORMULARY See admin instructions. Supplement: Give 240 med pass by mouth twice daily   Yes [provider]  Nutritional Supplements (ENSURE PO) Take 237 mLs by mouth in the morning and at bedtime.   Yes [provider]  pantoprazole (PROTONIX) 20 MG tablet Take 20 mg by mouth daily.   Yes [provider]  polyethylene glycol (MIRALAX / GLYCOLAX) 17 g packet Take 17 g by mouth daily. 4-8 OZ OF WATER AND DRINK BY MOUTH ONCE DAILY AS NEEDED FOR CONSTIPATION   Yes [provider]  Pramox-PE-Glycerin-Petrolatum (HEMORRHOIDAL EX) CREAM APPLY TOPICALLY TO EXTERNAL HEMORRHOIDS TWICE DAILY AS NEEDED - IRRITATION   Yes [provider]  sennosides-docusate sodium (SENOKOT-S) 8.6-50 MG tablet Take 2 tablets by mouth daily.   Yes [provider]  NON FORMULARY diet clarification - pureed Heart healthy diet    [provider]  pantoprazole (PROTONIX) 40 MG tablet Take 1 tablet (40 mg total) by mouth  daily. Patient not taking: Reported on 07/05/2022 07/30/21   Jonetta Osgood, MD    Physical Exam: Constitutional: Moderately built and nourished. Vitals:   07/05/22 2130 07/05/22 2200 07/05/22 2230 07/05/22 2304  BP: (!) 154/66 (!) 146/68 108/89 123/63  Pulse: 68 65 67 69  Resp: 18 19 15    Temp:      TempSrc:      SpO2: 96% 99% 96%    Eyes: Anicteric no pallor. ENMT: No discharge from the ears eyes nose and mouth. Neck: No mass felt.  No neck rigidity. Respiratory: No rhonchi or crepitation. Cardiovascular: S1-S2 heard. Abdomen: Soft nontender bowel sound present. Musculoskeletal: No edema. Skin: No rash. Neurologic: Alert awake oriented to time place and person.  Moving all extremities. Psychiatric: Appears normal.  Normal affect.   Labs on Admission: I have personally reviewed following labs and imaging studies  CBC: Recent Labs  Lab 07/05/22 1538  WBC 9.9  NEUTROABS 7.7  HGB 11.6*  HCT 36.6  MCV 100.0  PLT 0000000   Basic Metabolic Panel: Recent Labs  Lab 07/05/22 1538  NA 130*  K 5.2*  CL 95*  CO2 25  GLUCOSE 149*  BUN 36*  CREATININE 1.25*  CALCIUM 9.4   GFR: CrCl cannot be calculated (Unknown ideal weight.). Liver Function Tests: Recent Labs  Lab 07/05/22 1538  AST 37  ALT 22  ALKPHOS 66  BILITOT 0.6  PROT 6.8  ALBUMIN 3.2*   Recent Labs  Lab 07/05/22 1538  LIPASE 1,568*   No results for input(s): "AMMONIA" in the last 168 hours. Coagulation Profile: No results for input(s): "INR", "PROTIME" in the last 168 hours. Cardiac Enzymes: No results for input(s): "CKTOTAL", "CKMB", "CKMBINDEX", "TROPONINI" in the last 168 hours. BNP (last 3 results) No results for input(s): "PROBNP" in the last 8760 hours. HbA1C: No results for input(s): "HGBA1C" in the last 72 hours. CBG: No results for input(s): "GLUCAP" in the last 168 hours. Lipid Profile: No results for input(s): "CHOL", "HDL", "LDLCALC", "TRIG", "CHOLHDL", "LDLDIRECT" in the last 72  hours. Thyroid Function Tests: No results for input(s): "TSH", "T4TOTAL", "FREET4", "T3FREE", "THYROIDAB" in the last 72 hours. Anemia Panel: No results for input(s): "VITAMINB12", "FOLATE", "FERRITIN", "TIBC", "IRON", "RETICCTPCT" in the last 72 hours. Urine analysis:    Component Value Date/Time   COLORURINE STRAW (A) 07/27/2021 1603   APPEARANCEUR CLEAR 07/27/2021 1603   LABSPEC 1.005 07/27/2021 1603   PHURINE 7.0 07/27/2021 1603   GLUCOSEU 50 (A) 07/27/2021 1603   HGBUR NEGATIVE 07/27/2021 1603   BILIRUBINUR NEGATIVE 07/27/2021 1603   KETONESUR NEGATIVE 07/27/2021 1603   PROTEINUR NEGATIVE 07/27/2021 1603   UROBILINOGEN 0.2 04/29/2014 1514   NITRITE NEGATIVE 07/27/2021 1603   LEUKOCYTESUR NEGATIVE 07/27/2021 1603   Sepsis Labs: @LABRCNTIP (procalcitonin:4,lacticidven:4) )No results found for this or any previous visit (from the past 240 hour(s)).   Radiological Exams on Admission:  CT ABDOMEN PELVIS W CONTRAST  Result Date: 07/05/2022 CLINICAL DATA:  Pancreatitis, acute, severe.  Abdominal distention EXAM: CT ABDOMEN AND PELVIS WITH CONTRAST TECHNIQUE: Multidetector CT imaging of the abdomen and pelvis was performed using the standard protocol following bolus administration of intravenous contrast. RADIATION DOSE REDUCTION: This exam was performed according to the departmental dose-optimization program which includes automated exposure control, adjustment of the mA and/or kV according to patient size and/or use of iterative reconstruction technique. CONTRAST:  68mL OMNIPAQUE IOHEXOL 350 MG/ML SOLN COMPARISON:  None Available. FINDINGS: Lower chest: No acute abnormality.  See chest CT report Hepatobiliary: Small gallstones within the gallbladder. Mild intrahepatic and extrahepatic biliary ductal dilatation. Common bile duct measures up to 10 mm. This may be related to patient's age. No visible ductal stones. Pancreas: No focal abnormality or ductal dilatation. No surrounding inflammation  to suggest acute pancreatitis Spleen: No focal abnormality.  Normal size. Adrenals/Urinary Tract: Small bilateral renal cysts. No follow-up imaging recommended. Adrenal glands unremarkable. No stones or hydronephrosis. Urinary bladder unremarkable. Stomach/Bowel: Diffuse colonic diverticulosis. No active diverticulitis. Stomach and small bowel unremarkable. No bowel obstruction. Vascular/Lymphatic: Aortic atherosclerosis. No evidence of aneurysm or adenopathy. Reproductive: Prior hysterectomy.  No adnexal masses. Other: No free fluid or free air. Musculoskeletal: No acute bony abnormality. IMPRESSION: No CT evidence of acute pancreatitis. Diffuse colonic diverticulosis.  No active diverticulitis. Slight intrahepatic and extrahepatic biliary ductal dilatation, likely related to patient's age. Recommend correlation with LFTs. Cholelithiasis. Aortic atherosclerosis. Electronically Signed   By: Charlett Nose M.D.   On: 07/05/2022 18:27   CT Angio Chest PE W and/or Wo Contrast  Result Date: 07/05/2022 CLINICAL DATA:  Pulmonary embolism (PE) suspected, positive D-dimer EXAM: CT ANGIOGRAPHY CHEST WITH CONTRAST TECHNIQUE: Multidetector CT imaging of the chest was performed using the standard protocol during bolus administration of intravenous contrast. Multiplanar CT image reconstructions and MIPs were obtained to evaluate the vascular anatomy. RADIATION DOSE REDUCTION: This exam was performed according to the departmental dose-optimization program which includes automated exposure control, adjustment of the mA and/or kV according to patient size and/or use of iterative reconstruction technique. CONTRAST:  29mL OMNIPAQUE IOHEXOL 350 MG/ML SOLN COMPARISON:  None Available. FINDINGS: Cardiovascular: No filling defects in the pulmonary arteries to suggest pulmonary emboli. Cardiomegaly. Coronary artery and aortic calcifications. No evidence of aortic aneurysm. Mediastinum/Nodes: No mediastinal, hilar, or axillary adenopathy.  Trachea and esophagus are unremarkable. Thyroid unremarkable. Lungs/Pleura: No confluent airspace opacities or effusions. Upper Abdomen: No acute findings Musculoskeletal: Chest wall soft tissues are unremarkable. No acute bony abnormality. Review of the MIP images confirms the above findings. IMPRESSION: No evidence of pulmonary embolus. Cardiomegaly, coronary artery disease. No acute cardiopulmonary disease. Aortic Atherosclerosis (ICD10-I70.0). Electronically Signed   By: Charlett Nose M.D.   On: 07/05/2022 18:24   DG Chest Port 1 View  Result Date: 07/05/2022 CLINICAL DATA:  Syncopal episode EXAM: PORTABLE CHEST 1 VIEW COMPARISON:  07/23/2021 FINDINGS: Heart size upper limits of normal. Chronic aortic atherosclerosis. The lungs are clear. The vascularity is normal. No effusions. IMPRESSION: No active disease. Electronically Signed   By: Paulina Fusi M.D.   On: 07/05/2022 15:56    EKG: Independently reviewed.  Normal sinus rhythm.  Assessment/Plan Principal Problem:   Syncope Active Problems:   HTN (hypertension)   HLD (hyperlipidemia)   Cervical myelopathy (HCC)    Syncope -given the history looks like most likely could be vasovagal which happened during straining during patient trying to move her bowels.  Will closely monitor in  telemetry.  Check orthostatics in the morning.  Patient's last 2D echo done in September 2022 showed EF of 60 to 65% with grade 1 diastolic dysfunction. Abdominal discomfort with markedly elevated lipase with CT abdomen pelvis not showing any definite evidence of pancreatitis but did show some biliary ductal dilatation.  Will gently hydrate at this time keep patient on clear liquids get MRCP. Hypertension on beta-blockers. History of stroke on aspirin and statins. Chronic kidney disease stage III creatinine appears to be at baseline. History of chronic anemia follow CBC. History of cervical myelopathy no acute issues. History of left ICA paraophthalmic aneurysm.   No acute issues at this time.   DVT prophylaxis: Lovenox. Code Status: Full code.   Family Communication: Discussed with patient. Disposition Plan: Home. Consults called: None. Admission status: Observation.   Rise Patience MD [facility] Pager [phone].  If 7PM-7AM, please contact night-coverage [email or web address] Password [email or web address]  07/05/2022, 11:13 PM

## 2022-07-05 NOTE — ED Provider Notes (Signed)
[facility] Provider Note   CSN: [id number] Arrival date & time: 07/05/22  1453     History  Chief Complaint  Patient presents with   Loss of Consciousness    Jill Hernandez is a 88 y.o. female.   Loss of Consciousness   88 year old female with medical history significant for HTN, HLD, GERD, TIA, residing at [facility] who presents with generalized abdominal discomfort and constipation for the last 3 days.  The patient took a laxative this morning and had a successful bowel movement. She states she passed a hard stool ball. She was then back in the bathroom later in the day and had a syncopal episode that was unwitnessed while in the toilet. She denies any prodromal symptoms but was amnestic to the event.  Staff found the patient unresponsive and started CPR.  The patient immediately aroused when CPR was initiated.  She continues to report some abdominal discomfort in the epigastrium that is scribed as a dull aching and throbbing. She also endorses substernal chest soreness after her brief chest compressions. She denies any headache, vision changes, facial droop, or other neurologic deficit. No clear head or neck trauma although the patient is amnestic to the event.   Home Medications Prior to Admission medications   Medication Sig Start Date End Date Taking? Authorizing Provider  acetaminophen (TYLENOL) 650 MG CR tablet Take 650 mg by mouth in the morning, at noon, and at bedtime.   Yes [provider]  ammonium lactate (AMLACTIN) 12 % cream apply to both feet daily until resolved   Yes [provider]  ASPERCREME ARTHRITIS PAIN 1 % GEL Apply 2-4 g topically 4 (four) times daily as needed (for pain).   Yes [provider]  aspirin 81 MG chewable tablet Chew 81 mg by mouth daily at 3 pm.    Yes [provider]  BIOFREEZE 4 % GEL Apply 1 application topically in the morning, at noon, and  at bedtime.   Yes [provider]  carvedilol (COREG) 12.5 MG tablet Take 12.5 mg by mouth 2 (two) times daily with a meal. 02/07/19  Yes [provider]  lovastatin (MEVACOR) 20 MG tablet Take 20 mg by mouth daily after supper.   Yes [provider]  meloxicam (MOBIC) 15 MG tablet Take 15 mg by mouth daily.   Yes [provider]  NON FORMULARY See admin instructions. Supplement: Give 240 med pass by mouth twice daily   Yes [provider]  Nutritional Supplements (ENSURE PO) Take 237 mLs by mouth in the morning and at bedtime.   Yes [provider]  pantoprazole (PROTONIX) 20 MG tablet Take 20 mg by mouth daily.   Yes [provider]  polyethylene glycol (MIRALAX / GLYCOLAX) 17 g packet Take 17 g by mouth daily. 4-8 OZ OF WATER AND DRINK BY MOUTH ONCE DAILY AS NEEDED FOR CONSTIPATION   Yes [provider]  Pramox-PE-Glycerin-Petrolatum (HEMORRHOIDAL EX) CREAM APPLY TOPICALLY TO EXTERNAL HEMORRHOIDS TWICE DAILY AS NEEDED - IRRITATION   Yes [provider]  sennosides-docusate sodium (SENOKOT-S) 8.6-50 MG tablet Take 2 tablets by mouth daily.   Yes [provider]  NON FORMULARY diet clarification - pureed Heart healthy diet    [provider]  pantoprazole (PROTONIX) 40 MG tablet Take 1 tablet (40 mg total) by mouth daily. Patient not taking: Reported on 07/05/2022 07/30/21   Maretta BeesGhimire, Shanker M, MD  Allergies    Tape and Sulfa antibiotics    Review of Systems   Review of Systems  Cardiovascular:  Positive for syncope.  All other systems reviewed and are negative.   Physical Exam Updated Vital Signs BP (!) 142/64   Pulse 68   Temp (!) 97.5 F (36.4 C) (Oral)   Resp (!) 21   SpO2 97%  Physical Exam Vitals and nursing note reviewed. Exam conducted with a chaperone present.  Constitutional:      General: She is not in acute distress.    Appearance: She is well-developed.  HENT:      Head: Normocephalic and atraumatic.  Eyes:     Conjunctiva/sclera: Conjunctivae normal.  Neck:     Comments: No midline tenderness to palpation Cardiovascular:     Rate and Rhythm: Normal rate and regular rhythm.  Pulmonary:     Effort: Pulmonary effort is normal. No respiratory distress.     Breath sounds: Normal breath sounds.  Chest:     Comments: right sided chest wall tenderness to palpation, sternal tenderness Abdominal:     Palpations: Abdomen is soft.     Tenderness: There is no abdominal tenderness.  Genitourinary:    Comments: No fecal impaction Musculoskeletal:        General: No swelling.     Cervical back: Normal range of motion and neck supple.  Skin:    General: Skin is warm and dry.     Capillary Refill: Capillary refill takes less than 2 seconds.  Neurological:     Mental Status: She is alert.     Comments: MENTAL STATUS EXAM:    Orientation: Alert and oriented to person, place and time.  Memory: Cooperative, follows commands well.  Language: Speech is clear and language is normal.   CRANIAL NERVES:    CN 2 (Optic): Visual fields intact to confrontation.  CN 3,4,6 (EOM): Pupils equal and reactive to light. Full extraocular eye movement without nystagmus.  CN 5 (Trigeminal): Facial sensation is normal, no weakness of masticatory muscles.  CN 7 (Facial): No facial weakness or asymmetry.  CN 8 (Auditory): Patient is hard of hearing at baseline CN 9,10 (Glossophar): The uvula is midline, the palate elevates symmetrically.  CN 11 (spinal access): Normal sternocleidomastoid and trapezius strength.  CN 12 (Hypoglossal): The tongue is midline. No atrophy or fasciculations.Marland Kitchen   MOTOR:  Muscle Strength: 5/5RUE, 5/5LUE, 5/5RLE, 5/5LLE.   COORDINATION:   Intact finger-to-nose, no tremor.   SENSATION:   Intact to light touch all four extremities.    Psychiatric:        Mood and Affect: Mood normal.     ED Results / Procedures / Treatments   Labs (all labs  ordered are listed, but only abnormal results are displayed) Labs Reviewed  CBC WITH DIFFERENTIAL/PLATELET - Abnormal; Notable for the following components:      Result Value   RBC 3.66 (*)    Hemoglobin 11.6 (*)    Abs Immature Granulocytes 0.08 (*)    All other components within normal limits  COMPREHENSIVE METABOLIC PANEL - Abnormal; Notable for the following components:   Sodium 130 (*)    Potassium 5.2 (*)    Chloride 95 (*)    Glucose, Bld 149 (*)    BUN 36 (*)    Creatinine, Ser 1.25 (*)    Albumin 3.2 (*)    GFR, Estimated 41 (*)    All other components within normal limits  D-DIMER, QUANTITATIVE - Abnormal; Notable for  the following components:   D-Dimer, Quant 2.42 (*)    All other components within normal limits  LIPASE, BLOOD - Abnormal; Notable for the following components:   Lipase 1,568 (*)    All other components within normal limits  BRAIN NATRIURETIC PEPTIDE  TROPONIN I (HIGH SENSITIVITY)  TROPONIN I (HIGH SENSITIVITY)    EKG EKG Interpretation  Date/Time:  Wednesday July 05 2022 19:46:19 EDT Ventricular Rate:  71 PR Interval:  201 QRS Duration: 96 QT Interval:  415 QTC Calculation: 451 R Axis:   30 Text Interpretation: Sinus rhythm Inferior infarct, old Confirmed by Ernie Avena ([id number]) on 07/05/2022 10:02:47 PM  Radiology CT ABDOMEN PELVIS W CONTRAST  Result Date: 07/05/2022 CLINICAL DATA:  Pancreatitis, acute, severe.  Abdominal distention EXAM: CT ABDOMEN AND PELVIS WITH CONTRAST TECHNIQUE: Multidetector CT imaging of the abdomen and pelvis was performed using the standard protocol following bolus administration of intravenous contrast. RADIATION DOSE REDUCTION: This exam was performed according to the departmental dose-optimization program which includes automated exposure control, adjustment of the mA and/or kV according to patient size and/or use of iterative reconstruction technique. CONTRAST:  30mL OMNIPAQUE IOHEXOL 350 MG/ML SOLN COMPARISON:   None Available. FINDINGS: Lower chest: No acute abnormality.  See chest CT report Hepatobiliary: Small gallstones within the gallbladder. Mild intrahepatic and extrahepatic biliary ductal dilatation. Common bile duct measures up to 10 mm. This may be related to patient's age. No visible ductal stones. Pancreas: No focal abnormality or ductal dilatation. No surrounding inflammation to suggest acute pancreatitis Spleen: No focal abnormality.  Normal size. Adrenals/Urinary Tract: Small bilateral renal cysts. No follow-up imaging recommended. Adrenal glands unremarkable. No stones or hydronephrosis. Urinary bladder unremarkable. Stomach/Bowel: Diffuse colonic diverticulosis. No active diverticulitis. Stomach and small bowel unremarkable. No bowel obstruction. Vascular/Lymphatic: Aortic atherosclerosis. No evidence of aneurysm or adenopathy. Reproductive: Prior hysterectomy.  No adnexal masses. Other: No free fluid or free air. Musculoskeletal: No acute bony abnormality. IMPRESSION: No CT evidence of acute pancreatitis. Diffuse colonic diverticulosis.  No active diverticulitis. Slight intrahepatic and extrahepatic biliary ductal dilatation, likely related to patient's age. Recommend correlation with LFTs. Cholelithiasis. Aortic atherosclerosis. Electronically Signed   By: Charlett Nose M.D.   On: 07/05/2022 18:27   CT Angio Chest PE W and/or Wo Contrast  Result Date: 07/05/2022 CLINICAL DATA:  Pulmonary embolism (PE) suspected, positive D-dimer EXAM: CT ANGIOGRAPHY CHEST WITH CONTRAST TECHNIQUE: Multidetector CT imaging of the chest was performed using the standard protocol during bolus administration of intravenous contrast. Multiplanar CT image reconstructions and MIPs were obtained to evaluate the vascular anatomy. RADIATION DOSE REDUCTION: This exam was performed according to the departmental dose-optimization program which includes automated exposure control, adjustment of the mA and/or kV according to patient  size and/or use of iterative reconstruction technique. CONTRAST:  47mL OMNIPAQUE IOHEXOL 350 MG/ML SOLN COMPARISON:  None Available. FINDINGS: Cardiovascular: No filling defects in the pulmonary arteries to suggest pulmonary emboli. Cardiomegaly. Coronary artery and aortic calcifications. No evidence of aortic aneurysm. Mediastinum/Nodes: No mediastinal, hilar, or axillary adenopathy. Trachea and esophagus are unremarkable. Thyroid unremarkable. Lungs/Pleura: No confluent airspace opacities or effusions. Upper Abdomen: No acute findings Musculoskeletal: Chest wall soft tissues are unremarkable. No acute bony abnormality. Review of the MIP images confirms the above findings. IMPRESSION: No evidence of pulmonary embolus. Cardiomegaly, coronary artery disease. No acute cardiopulmonary disease. Aortic Atherosclerosis (ICD10-I70.0). Electronically Signed   By: Charlett Nose M.D.   On: 07/05/2022 18:24   DG Chest Port 1 View  Result Date: 07/05/2022 CLINICAL  DATA:  Syncopal episode EXAM: PORTABLE CHEST 1 VIEW COMPARISON:  07/23/2021 FINDINGS: Heart size upper limits of normal. Chronic aortic atherosclerosis. The lungs are clear. The vascularity is normal. No effusions. IMPRESSION: No active disease. Electronically Signed   By: Paulina Fusi M.D.   On: 07/05/2022 15:56    Procedures Procedures    Medications Ordered in ED Medications  acetaminophen (TYLENOL) tablet 1,000 mg (500 mg Oral Given 07/05/22 1611)  iohexol (OMNIPAQUE) 350 MG/ML injection 80 mL (80 mLs Intravenous Contrast Given 07/05/22 1800)  sodium chloride 0.9 % bolus 500 mL (500 mLs Intravenous New Bag/Given 07/05/22 1937)    ED Course/ Medical Decision Making/ A&P Clinical Course as of 07/05/22 2205  Wed Jul 05, 2022  1617 D-Dimer, Quant(!): 2.42 [JL]  1711 Lipase(!): 1,568 [JL]    Clinical Course User Index [JL] Ernie Avena, MD                           Medical Decision Making Amount and/or Complexity of Data Reviewed Labs: ordered.  Decision-making details documented in ED Course. Radiology: ordered.  Risk OTC drugs. Prescription drug management. Decision regarding hospitalization.   88 year old female with medical history significant for HTN, HLD, GERD, TIA, residing at [facility] who presents with generalized abdominal discomfort and constipation for the last 3 days.  The patient took a laxative this morning and had a successful bowel movement. She states she passed a hard stool ball. She was then back in the bathroom later in the day and had a syncopal episode that was unwitnessed while in the toilet. She denies any prodromal symptoms but was amnestic to the event.  Staff found the patient unresponsive and started CPR.  The patient immediately aroused when CPR was initiated.  She continues to report some abdominal discomfort in the epigastrium that is scribed as a dull aching and throbbing. She also endorses substernal chest soreness after her brief chest compressions. She denies any headache, vision changes, facial droop, or other neurologic deficit. No clear head or neck trauma although the patient is amnestic to the event.   On arrival, the patient was vitally stable, afebrile, not tachycardic or tachypneic, BP 129/69, saturating 97% on room air.  Physical exam significant for a normal neurologic exam.  The patient denies any headache or visual deficit.  Low concern for CVA or SAH.  Suspect potential vasovagal episode while the patient was having a straining episode trying to have a bowel movement on the toilet resulting in the patient's syncope although the patient denies any prodromal symptoms.  Cardiogenic arrhythmia also considered.  Considered PE, ACS.  On arrival, EKG was performed which revealed old infarct in the inferior leads, no acute abnormalities, no STEMI, no abnormal intervals.  A chest x-ray was performed revealed no acute cardiac or pulmonary abnormality.  Laboratory workup  significant for an elevated D-dimer to 2.42.  Considered PE and CTA PE imaging was ordered.  The patient had also complained of some mild nonspecific upper abdominal discomfort and had complaint of constipation.  A lipase was elevated to 1568.  Repeat assessment, the patient had very minimal tenderness to palpation of the epigastrium.  She endorses no nausea or vomiting.  CT of the abdomen pelvis without contrast was ordered and revealed no evidence of acute pancreatitis.  A CT PE study revealed no evidence of acute PE and no acute intrathoracic abnormality.  CT Abdomen Pelvis: IMPRESSION:  No CT evidence of  acute pancreatitis.    Diffuse colonic diverticulosis.  No active diverticulitis.    Slight intrahepatic and extrahepatic biliary ductal dilatation,  likely related to patient's age. Recommend correlation with LFTs.    Cholelithiasis.    Aortic atherosclerosis.    CTA PE: FINDINGS:  Cardiovascular: No filling defects in the pulmonary arteries to  suggest pulmonary emboli. Cardiomegaly. Coronary artery and aortic  calcifications. No evidence of aortic aneurysm.    Mediastinum/Nodes: No mediastinal, hilar, or axillary adenopathy.  Trachea and esophagus are unremarkable. Thyroid unremarkable.    Lungs/Pleura: No confluent airspace opacities or effusions.    Upper Abdomen: No acute findings    Musculoskeletal: Chest wall soft tissues are unremarkable. No acute  bony abnormality.    Review of the MIP images confirms the above findings.    IMPRESSION:  No evidence of pulmonary embolus.    Cardiomegaly, coronary artery disease.    No acute cardiopulmonary disease.    Aortic Atherosclerosis (ICD10-I70.0).    Given the patient's sudden onset syncope and collapse requiring brief chest compressions due to unresponsiveness, medicine was consulted for admission due to concern for high risk syncope and need for observation on cardiac telemetry.  The patient was administered 1 g of  Tylenol for chest wall pain which is likely musculoskeletal pain in the setting of receiving brief chest compressions.  At the time time of admission, the patient was neuro intact, hemodynamically stable with stable vitals.  Dr. Toniann Fail of hospitalist medicine was consulted and accepted the patient in admission.  Final Clinical Impression(s) / ED Diagnoses Final diagnoses:  Syncope and collapse  Elevated lipase    Rx / DC Orders ED Discharge Orders     None         Ernie Avena, MD 07/05/22 2205

## 2022-07-06 ENCOUNTER — Observation Stay (HOSPITAL_COMMUNITY): Payer: Medicare Other

## 2022-07-06 ENCOUNTER — Encounter (HOSPITAL_COMMUNITY): Payer: Self-pay | Admitting: Internal Medicine

## 2022-07-06 ENCOUNTER — Other Ambulatory Visit: Payer: Self-pay

## 2022-07-06 DIAGNOSIS — Z87891 Personal history of nicotine dependence: Secondary | ICD-10-CM | POA: Diagnosis not present

## 2022-07-06 DIAGNOSIS — K8050 Calculus of bile duct without cholangitis or cholecystitis without obstruction: Secondary | ICD-10-CM | POA: Diagnosis not present

## 2022-07-06 DIAGNOSIS — R748 Abnormal levels of other serum enzymes: Secondary | ICD-10-CM

## 2022-07-06 DIAGNOSIS — D631 Anemia in chronic kidney disease: Secondary | ICD-10-CM | POA: Diagnosis present

## 2022-07-06 DIAGNOSIS — N289 Disorder of kidney and ureter, unspecified: Secondary | ICD-10-CM | POA: Diagnosis not present

## 2022-07-06 DIAGNOSIS — R41841 Cognitive communication deficit: Secondary | ICD-10-CM | POA: Diagnosis not present

## 2022-07-06 DIAGNOSIS — N179 Acute kidney failure, unspecified: Secondary | ICD-10-CM | POA: Insufficient documentation

## 2022-07-06 DIAGNOSIS — E785 Hyperlipidemia, unspecified: Secondary | ICD-10-CM | POA: Diagnosis present

## 2022-07-06 DIAGNOSIS — K449 Diaphragmatic hernia without obstruction or gangrene: Secondary | ICD-10-CM | POA: Diagnosis present

## 2022-07-06 DIAGNOSIS — Z741 Need for assistance with personal care: Secondary | ICD-10-CM | POA: Diagnosis not present

## 2022-07-06 DIAGNOSIS — R93429 Abnormal radiologic findings on diagnostic imaging of unspecified kidney: Secondary | ICD-10-CM

## 2022-07-06 DIAGNOSIS — I10 Essential (primary) hypertension: Secondary | ICD-10-CM | POA: Diagnosis not present

## 2022-07-06 DIAGNOSIS — N183 Chronic kidney disease, stage 3 unspecified: Secondary | ICD-10-CM | POA: Diagnosis present

## 2022-07-06 DIAGNOSIS — K222 Esophageal obstruction: Secondary | ICD-10-CM | POA: Diagnosis present

## 2022-07-06 DIAGNOSIS — Z8249 Family history of ischemic heart disease and other diseases of the circulatory system: Secondary | ICD-10-CM | POA: Diagnosis not present

## 2022-07-06 DIAGNOSIS — Z791 Long term (current) use of non-steroidal anti-inflammatories (NSAID): Secondary | ICD-10-CM | POA: Diagnosis not present

## 2022-07-06 DIAGNOSIS — E669 Obesity, unspecified: Secondary | ICD-10-CM

## 2022-07-06 DIAGNOSIS — M6281 Muscle weakness (generalized): Secondary | ICD-10-CM | POA: Diagnosis not present

## 2022-07-06 DIAGNOSIS — I639 Cerebral infarction, unspecified: Secondary | ICD-10-CM | POA: Diagnosis not present

## 2022-07-06 DIAGNOSIS — Z83438 Family history of other disorder of lipoprotein metabolism and other lipidemia: Secondary | ICD-10-CM | POA: Diagnosis not present

## 2022-07-06 DIAGNOSIS — K5710 Diverticulosis of small intestine without perforation or abscess without bleeding: Secondary | ICD-10-CM | POA: Diagnosis not present

## 2022-07-06 DIAGNOSIS — Z91048 Other nonmedicinal substance allergy status: Secondary | ICD-10-CM | POA: Diagnosis not present

## 2022-07-06 DIAGNOSIS — G959 Disease of spinal cord, unspecified: Secondary | ICD-10-CM | POA: Diagnosis present

## 2022-07-06 DIAGNOSIS — R2681 Unsteadiness on feet: Secondary | ICD-10-CM | POA: Diagnosis not present

## 2022-07-06 DIAGNOSIS — K5750 Diverticulosis of both small and large intestine without perforation or abscess without bleeding: Secondary | ICD-10-CM | POA: Diagnosis present

## 2022-07-06 DIAGNOSIS — Z79899 Other long term (current) drug therapy: Secondary | ICD-10-CM | POA: Diagnosis not present

## 2022-07-06 DIAGNOSIS — I69391 Dysphagia following cerebral infarction: Secondary | ICD-10-CM | POA: Diagnosis not present

## 2022-07-06 DIAGNOSIS — I679 Cerebrovascular disease, unspecified: Secondary | ICD-10-CM | POA: Diagnosis not present

## 2022-07-06 DIAGNOSIS — Z8673 Personal history of transient ischemic attack (TIA), and cerebral infarction without residual deficits: Secondary | ICD-10-CM | POA: Diagnosis not present

## 2022-07-06 DIAGNOSIS — K5909 Other constipation: Secondary | ICD-10-CM | POA: Diagnosis present

## 2022-07-06 DIAGNOSIS — M170 Bilateral primary osteoarthritis of knee: Secondary | ICD-10-CM | POA: Diagnosis present

## 2022-07-06 DIAGNOSIS — Z882 Allergy status to sulfonamides status: Secondary | ICD-10-CM | POA: Diagnosis not present

## 2022-07-06 DIAGNOSIS — Z7982 Long term (current) use of aspirin: Secondary | ICD-10-CM | POA: Diagnosis not present

## 2022-07-06 DIAGNOSIS — K838 Other specified diseases of biliary tract: Secondary | ICD-10-CM | POA: Diagnosis not present

## 2022-07-06 DIAGNOSIS — K8070 Calculus of gallbladder and bile duct without cholecystitis without obstruction: Secondary | ICD-10-CM | POA: Diagnosis present

## 2022-07-06 DIAGNOSIS — K8020 Calculus of gallbladder without cholecystitis without obstruction: Secondary | ICD-10-CM | POA: Diagnosis not present

## 2022-07-06 DIAGNOSIS — E1122 Type 2 diabetes mellitus with diabetic chronic kidney disease: Secondary | ICD-10-CM | POA: Diagnosis present

## 2022-07-06 DIAGNOSIS — R55 Syncope and collapse: Secondary | ICD-10-CM | POA: Diagnosis not present

## 2022-07-06 DIAGNOSIS — K5730 Diverticulosis of large intestine without perforation or abscess without bleeding: Secondary | ICD-10-CM | POA: Diagnosis not present

## 2022-07-06 DIAGNOSIS — K219 Gastro-esophageal reflux disease without esophagitis: Secondary | ICD-10-CM | POA: Diagnosis present

## 2022-07-06 DIAGNOSIS — I129 Hypertensive chronic kidney disease with stage 1 through stage 4 chronic kidney disease, or unspecified chronic kidney disease: Secondary | ICD-10-CM | POA: Diagnosis present

## 2022-07-06 LAB — CBC
HCT: 36.9 % (ref 36.0–46.0)
Hemoglobin: 12 g/dL (ref 12.0–15.0)
MCH: 31.8 pg (ref 26.0–34.0)
MCHC: 32.5 g/dL (ref 30.0–36.0)
MCV: 97.9 fL (ref 80.0–100.0)
Platelets: 232 10*3/uL (ref 150–400)
RBC: 3.77 MIL/uL — ABNORMAL LOW (ref 3.87–5.11)
RDW: 13.7 % (ref 11.5–15.5)
WBC: 11.2 10*3/uL — ABNORMAL HIGH (ref 4.0–10.5)
nRBC: 0 % (ref 0.0–0.2)

## 2022-07-06 LAB — GLUCOSE, CAPILLARY
Glucose-Capillary: 93 mg/dL (ref 70–99)
Glucose-Capillary: 114 mg/dL — ABNORMAL HIGH (ref 70–99)

## 2022-07-06 LAB — BASIC METABOLIC PANEL
Anion gap: 10 (ref 5–15)
BUN: 25 mg/dL — ABNORMAL HIGH (ref 8–23)
CO2: 25 mmol/L (ref 22–32)
Calcium: 9.3 mg/dL (ref 8.9–10.3)
Chloride: 98 mmol/L (ref 98–111)
Creatinine, Ser: 0.96 mg/dL (ref 0.44–1.00)
GFR, Estimated: 57 mL/min — ABNORMAL LOW (ref >60)
Glucose, Bld: 97 mg/dL (ref 70–99)
Potassium: 4.2 mmol/L (ref 3.5–5.1)
Sodium: 133 mmol/L — ABNORMAL LOW (ref 135–145)

## 2022-07-06 LAB — CBG MONITORING, ED
Glucose-Capillary: 103 mg/dL — ABNORMAL HIGH (ref 70–99)
Glucose-Capillary: 86 mg/dL (ref 70–99)

## 2022-07-06 LAB — TRIGLYCERIDES: Triglycerides: 78 mg/dL (ref <150)

## 2022-07-06 LAB — HEMOGLOBIN A1C
Hgb A1c MFr Bld: 5.9 % — ABNORMAL HIGH (ref 4.8–5.6)
Mean Plasma Glucose: 122.63 mg/dL

## 2022-07-06 MED ORDER — SODIUM CHLORIDE 0.9 % IV SOLN: INTRAVENOUS | Status: DC

## 2022-07-06 MED ORDER — ALUM & MAG HYDROXIDE-SIMETH 200-200-20 MG/5ML PO SUSP
30.0000 mL | Freq: Four times a day (QID) | ORAL | Status: DC | PRN
  Administered 2022-07-06 – 2022-07-07 (×2): 30 mL via ORAL
  Filled 2022-07-06 (×2): qty 30

## 2022-07-06 MED ORDER — LACTATED RINGERS IV SOLN: INTRAVENOUS | Status: DC

## 2022-07-06 MED ORDER — DICLOFENAC SODIUM 1 % EX GEL
2.0000 g | Freq: Four times a day (QID) | CUTANEOUS | Status: DC | PRN
  Filled 2022-07-06: qty 100

## 2022-07-06 MED ORDER — INSULIN ASPART 100 UNIT/ML IJ SOLN: 0.0000 [IU] | Freq: Three times a day (TID) | INTRAMUSCULAR | Status: DC

## 2022-07-06 MED ORDER — DICLOFENAC SODIUM 1 % EX GEL
2.0000 g | Freq: Four times a day (QID) | CUTANEOUS | Status: DC
Start: 2022-07-06 — End: 2022-07-09
  Administered 2022-07-06 – 2022-07-09 (×10): 2 g via TOPICAL
  Filled 2022-07-06: qty 100

## 2022-07-06 NOTE — Assessment & Plan Note (Signed)
follow

## 2022-07-06 NOTE — Hospital Course (Addendum)
Jill Hernandez is Jill Hernandez 88 y.o. female with history of CVA in September 2022, hypertension, chronic anemia, hyperlipidemia was brought to the ER after patient had Jill Hernandez brief episode of loss of consciousness.  Patient was complaining of abdominal discomfort for the last 3 days with abdominal distention.  While straining in the bathroom patient lost consciousness and the staff at the healthcare facility had to do CPR briefly.  Patient was brought to the ER.  Patient does not recall incident except going to the bathroom.   She was found to have elevated lipase, MRCP with choledocholithiasis.  S/p ERCP with biliary sphincterotomy, balloon extraction, esophageal dilation for stenosis.  She's not interested in surgery.  Syncopal event thought vagally mediated with straining.  See below for additional details

## 2022-07-06 NOTE — Assessment & Plan Note (Addendum)
Not that impressively symptomatic from pain/discomfort standpoint Lipase elevated ~1500, LFT's wnl MRCP with cholelithasis and choledocholithiasis, pancreas was without mass, fluid collections, or inflammatory changes ERCP with choledocholithiasis, s/p biliary spinchterotomy and balloon extraction, esophageal stenosis (dilated) GI c/s, appreciate recs -> recommending surgery c/s regarding consideration of cholecystectomy

## 2022-07-06 NOTE — Assessment & Plan Note (Signed)
noted 

## 2022-07-06 NOTE — Procedures (Signed)
Patient Name: Jill Hernandez  MRN: [id number]  Epilepsy Attending: Charlsie Quest  Referring Physician/Provider: Eduard Clos, MD  Date: 07/05/2022 Duration: 21.40 mins  Patient history: 88 year old female with syncope.  EEG evaluate for seizure.  Level of alertness: Awake  AEDs during EEG study: None  Technical aspects: This EEG study was done with scalp electrodes positioned according to the 10-20 International system of electrode placement. Electrical activity was reviewed with band pass filter of 1-70Hz , sensitivity of 7 uV/mm, display speed of 86mm/sec with a 60Hz  notched filter applied as appropriate. EEG data were recorded continuously and digitally stored.  Video monitoring was available and reviewed as appropriate.  Description: The posterior dominant rhythm consists of 8 Hz activity of moderate voltage (25-35 uV) seen predominantly in posterior head regions, symmetric and reactive to eye opening and eye closing. Hyperventilation and photic stimulation were not performed.     IMPRESSION: This study is within normal limits. No seizures or epileptiform discharges were seen throughout the recording.  A normal interictal EEG does not exclude nor support the diagnosis of epilepsy.   Terrye Dombrosky 

## 2022-07-06 NOTE — H&P (View-Only) (Signed)
Consultation Note   Referring Provider: [facility] PCP: Jill Elk, MD Primary Gastroenterologist: Jill Hernandez  Reason for consultation: Choledocholithiasis  Hospital Day: 2  Assessment / Plan:    # Cholelithiasis  /choledocholithiasis. Probably has biliary pancreatitis based on elevated lipase though CT scan doesn't show pancreatitis.  Documented complaints of abdominal pain but she tells me that she has not really had any abdominal pain.  No upper abdominal tenderness on exam, hemodynamically stable,  HCT 36.9% .  Continue IV hydration We need ERCP.  The benefits as well as the risk not limited to infection, bleeding, perforation, worsening pancreatitis were discussed with the patient and she agrees to proceed.  Will get a date and time for the procedure.  Keep n.p.o. for now  #Syncopal episode on toilet. Head CT without acute findings. EEG is normal. No acute EKG changes. Vasovagal with straining?   # Chronic constipation  # See PMH for additional medical problems   HPI   Jill Hernandez is a 88 y.o. female with a past medical history significant for  DM, CVA, HTN, HLD, OA, GERD, diverticulosis , cholelithiasis / choledocholithiasis.  See PMH for any additional medical problems.  Patient presented to ED from [facility] for evaluation of abdominal pain and constipation as well as a syncopal episode on toilet.   ED workup:  Sodium low at 130, potassium 5.2, creatinine elevated at 1.25 Lipase 1568 Liver chemistries normal Normal WBC Hemoglobin 11.6 (baseline 10.5), normal platelet count  EKG shows normal sinus rhythm  CT head - without acute findings  CT of chest abdomen and pelvis -small gallstone in the gallbladder mild intra hepatic and extrahepatic biliary duct dilation.CBD measuring 10 mm.no visible stones in the duct.pancreas without evidence of pancreatitis.without acute findings MRCP -Study is positive for  cholelithiasis as well as choledocholithiasis. There are stones not only in the gallbladder, but in the distal cystic duct and in the common bile duct. This is associated with mild intra and extrahepatic biliary ductal dilatation. No overt imaging findings of acute cholecystitis are noted at this time.  Jill Hernandez tells me that she really hasn't had any abdominal pain. Her only complaint is that of constipation which is a chronic issue. She takes stools softeners and also MOM / miralax as needed. Occasionally has blood in her stools.   Previous GI Evaluation      Recent Labs and Imaging EEG adult  Result Date: 07/06/2022 Jill Quest, MD     07/06/2022  8:29 AM Patient Name: Jill Hernandez MRN: [id number] Epilepsy Attending: Charlsie Hernandez Referring Physician/Provider: Eduard Clos, MD Date: 07/05/2022 Duration: 21.40 mins Patient history: 88 year old female with syncope.  EEG evaluate for seizure. Level of alertness: Awake AEDs during EEG study: None Technical aspects: This EEG study was done with scalp electrodes positioned according to the 10-20 International system of electrode placement. Electrical activity was reviewed with band pass filter of 1-70Hz , sensitivity of 7 uV/mm, display speed of 80mm/sec with a 60Hz  notched filter applied as appropriate. EEG data were recorded continuously and digitally stored.  Video monitoring was available and reviewed as appropriate. Description: The posterior dominant rhythm consists of 8 Hz activity of moderate voltage (25-35 uV) seen predominantly in posterior head  regions, symmetric and reactive to eye opening and eye closing. Hyperventilation and photic stimulation were not performed.   IMPRESSION: This study is within normal limits. No seizures or epileptiform discharges were seen throughout the recording. A normal interictal EEG does not exclude nor support the diagnosis of epilepsy. Jill Hernandez   MR ABDOMEN MRCP WO CONTRAST  Result  Date: 07/06/2022 CLINICAL DATA:  88 year old female with history of abdominal discomfort. Evaluate for cholelithiasis. EXAM: MRI ABDOMEN WITHOUT CONTRAST  (INCLUDING MRCP) TECHNIQUE: Multiplanar multisequence MR imaging of the abdomen was performed. Heavily T2-weighted images of the biliary and pancreatic ducts were obtained, and three-dimensional MRCP images were rendered by post processing. COMPARISON:  No prior abdominal MRI. CT of the abdomen and pelvis 07/05/2022. FINDINGS: Comment: Portions of today's examination are considerably limited by patient respiratory motion. Additionally, today's study is limited for detection and characterization of visceral and/or vascular lesions by lack of IV gadolinium. Lower chest: Unremarkable. Hepatobiliary: No suspicious cystic or solid hepatic lesions are confidently identified on today's noncontrast examination. There are filling defects in the gallbladder, compatible with gallstones, measuring up to 1.2 cm. Gallbladder is not distended. Gallbladder wall thickness is normal. No pericholecystic fluid or surrounding inflammatory changes. Mild intra and extrahepatic biliary ductal dilatation. MRCP images demonstrate filling defects in the common bile duct measuring up to 7 mm (image 51 of series 18), and in the distal cystic duct measuring up to 4 mm (image 51 of series 18). Pancreas: No definite pancreatic mass or peripancreatic fluid collections or inflammatory changes noted on today's noncontrast examination. No pancreatic ductal dilatation noted on MRCP images. Spleen:  Unremarkable. Adrenals/Urinary Tract: Multiple renal lesions bilaterally which are T1 hypointense and variable signal intensity on T2 weighted images ranging from T2 hyper to hypointense, incompletely characterized on today's noncontrast examination, but statistically likely to represent a combination of simple and proteinaceous/hemorrhagic cysts. No hydroureteronephrosis in the visualized portions of the  abdomen. Bilateral adrenal glands are normal in appearance. Stomach/Bowel: Numerous colonic diverticulae are noted. Visualized portions are otherwise unremarkable. Vascular/Lymphatic: No aneurysm identified in the visualized abdominal vasculature. No lymphadenopathy noted in the abdomen. Other: No significant volume of ascites noted in the visualized portions of the peritoneal cavity. Musculoskeletal: No aggressive appearing osseous lesions are noted in the visualized portions of the skeleton. IMPRESSION: 1. Study is positive for cholelithiasis as well as choledocholithiasis. There are stones not only in the gallbladder, but in the distal cystic duct and in the common bile duct. This is associated with mild intra and extrahepatic biliary ductal dilatation. No overt imaging findings of acute cholecystitis are noted at this time. 2. Colonic diverticulosis without evidence of acute diverticulitis at this time. 3. Multiple renal lesions bilaterally, incompletely characterized on today's noncontrast examination, but statistically likely a combination of proteinaceous and hemorrhagic cysts. Follow-up abdominal MRI with and without IV gadolinium could be considered if of clinical concern. Electronically Signed   By: Trudie Reed M.D.   On: 07/06/2022 07:07   CT HEAD WO CONTRAST ( )  Result Date: 07/05/2022 CLINICAL DATA:  Mental status change, unknown cause Unwitnessed syncopal event in the bathroom. EXAM: CT HEAD WITHOUT CONTRAST TECHNIQUE: Contiguous axial images were obtained from the base of the skull through the vertex without intravenous contrast. RADIATION DOSE REDUCTION: This exam was performed according to the departmental dose-optimization program which includes automated exposure control, adjustment of the mA and/or kV according to patient size and/or use of iterative reconstruction technique. COMPARISON:  Head CT and brain MRI 07/27/2021  FINDINGS: Brain: No acute intracranial hemorrhage. There is  encephalomalacia in the right occipital lobe that is new from prior exam. No evidence of acute ischemia. There is no hydrocephalus, mass effect, or midline shift. No extra-axial or subdural collection. Normal for age atrophy with mild periventricular chronic small vessel ischemia. Vascular: Atherosclerosis of skullbase vasculature without hyperdense vessel or abnormal calcification. Skull: No acute findings. Sinuses/Orbits: Chronic opacification of right side of sphenoid sinus, increased from prior exam. No mastoid effusion. Bilateral cataract resection. Other: None. IMPRESSION: 1. No acute intracranial abnormality. 2. Encephalomalacia in the right occipital lobe. This likely represents sequela of prior infarct, new from August of 2022. 3. Age related atrophy and mild chronic small vessel ischemia. Electronically Signed   By: Narda RutherfordMelanie  Sanford M.D.   On: 07/05/2022 23:55   CT ABDOMEN PELVIS W CONTRAST  Result Date: 07/05/2022 CLINICAL DATA:  Pancreatitis, acute, severe.  Abdominal distention EXAM: CT ABDOMEN AND PELVIS WITH CONTRAST TECHNIQUE: Multidetector CT imaging of the abdomen and pelvis was performed using the standard protocol following bolus administration of intravenous contrast. RADIATION DOSE REDUCTION: This exam was performed according to the departmental dose-optimization program which includes automated exposure control, adjustment of the mA and/or kV according to patient size and/or use of iterative reconstruction technique. CONTRAST:  80mL OMNIPAQUE IOHEXOL 350 MG/ML SOLN COMPARISON:  None Available. FINDINGS: Lower chest: No acute abnormality.  See chest CT report Hepatobiliary: Small gallstones within the gallbladder. Mild intrahepatic and extrahepatic biliary ductal dilatation. Common bile duct measures up to 10 mm. This may be related to patient's age. No visible ductal stones. Pancreas: No focal abnormality or ductal dilatation. No surrounding inflammation to suggest acute pancreatitis  Spleen: No focal abnormality.  Normal size. Adrenals/Urinary Tract: Small bilateral renal cysts. No follow-up imaging recommended. Adrenal glands unremarkable. No stones or hydronephrosis. Urinary bladder unremarkable. Stomach/Bowel: Diffuse colonic diverticulosis. No active diverticulitis. Stomach and small bowel unremarkable. No bowel obstruction. Vascular/Lymphatic: Aortic atherosclerosis. No evidence of aneurysm or adenopathy. Reproductive: Prior hysterectomy.  No adnexal masses. Other: No free fluid or free air. Musculoskeletal: No acute bony abnormality. IMPRESSION: No CT evidence of acute pancreatitis. Diffuse colonic diverticulosis.  No active diverticulitis. Slight intrahepatic and extrahepatic biliary ductal dilatation, likely related to patient's age. Recommend correlation with LFTs. Cholelithiasis. Aortic atherosclerosis. Electronically Signed   By: Charlett NoseKevin  Dover M.D.   On: 07/05/2022 18:27   CT Angio Chest PE W and/or Wo Contrast  Result Date: 07/05/2022 CLINICAL DATA:  Pulmonary embolism (PE) suspected, positive D-dimer EXAM: CT ANGIOGRAPHY CHEST WITH CONTRAST TECHNIQUE: Multidetector CT imaging of the chest was performed using the standard protocol during bolus administration of intravenous contrast. Multiplanar CT image reconstructions and MIPs were obtained to evaluate the vascular anatomy. RADIATION DOSE REDUCTION: This exam was performed according to the departmental dose-optimization program which includes automated exposure control, adjustment of the mA and/or kV according to patient size and/or use of iterative reconstruction technique. CONTRAST:  80mL OMNIPAQUE IOHEXOL 350 MG/ML SOLN COMPARISON:  None Available. FINDINGS: Cardiovascular: No filling defects in the pulmonary arteries to suggest pulmonary emboli. Cardiomegaly. Coronary artery and aortic calcifications. No evidence of aortic aneurysm. Mediastinum/Nodes: No mediastinal, hilar, or axillary adenopathy. Trachea and esophagus are  unremarkable. Thyroid unremarkable. Lungs/Pleura: No confluent airspace opacities or effusions. Upper Abdomen: No acute findings Musculoskeletal: Chest wall soft tissues are unremarkable. No acute bony abnormality. Review of the MIP images confirms the above findings. IMPRESSION: No evidence of pulmonary embolus. Cardiomegaly, coronary artery disease. No acute cardiopulmonary disease. Aortic Atherosclerosis (ICD10-I70.0).  Electronically Signed   By: Charlett Nose M.D.   On: 07/05/2022 18:24   DG Chest Port 1 View  Result Date: 07/05/2022 CLINICAL DATA:  Syncopal episode EXAM: PORTABLE CHEST 1 VIEW COMPARISON:  07/23/2021 FINDINGS: Heart size upper limits of normal. Chronic aortic atherosclerosis. The lungs are clear. The vascularity is normal. No effusions. IMPRESSION: No active disease. Electronically Signed   By: Paulina Fusi M.D.   On: 07/05/2022 15:56    Labs:  Recent Labs    07/05/22 1538 07/06/22 0254  WBC 9.9 11.2*  HGB 11.6* 12.0  HCT 36.6 36.9  PLT 243 232   Recent Labs    07/05/22 1538 07/06/22 0254  NA 130* 133*  K 5.2* 4.2  CL 95* 98  CO2 25 25  GLUCOSE 149* 97  BUN 36* 25*  CREATININE 1.25* 0.96  CALCIUM 9.4 9.3   Recent Labs    07/05/22 1538  PROT 6.8  ALBUMIN 3.2*  AST 37  ALT 22  ALKPHOS 66  BILITOT 0.6   No results for input(s): "HEPBSAG", "HCVAB", "HEPAIGM", "HEPBIGM" in the last 72 hours. No results for input(s): "LABPROT", "INR" in the last 72 hours.  Past Medical History:  Diagnosis Date   GERD (gastroesophageal reflux disease)    Hyperlipemia    Hypertension    TIA (transient ischemic attack)     Past Surgical History:  Procedure Laterality Date   ABDOMINAL HYSTERECTOMY     TONSILLECTOMY      Family History  Problem Relation Age of Onset   Hypertension Mother    Hyperlipidemia Father    Hypertension Father     Prior to Admission medications   Medication Sig Start Date End Date Taking? Authorizing Provider  acetaminophen (TYLENOL)  650 MG CR tablet Take 650 mg by mouth in the morning, at noon, and at bedtime.   Yes [provider]  ammonium lactate (AMLACTIN) 12 % cream apply to both feet daily until resolved   Yes [provider]  ASPERCREME ARTHRITIS PAIN 1 % GEL Apply 2-4 g topically 4 (four) times daily as needed (for pain).   Yes [provider]  aspirin 81 MG chewable tablet Chew 81 mg by mouth daily at 3 pm.    Yes [provider]  BIOFREEZE 4 % GEL Apply 1 application topically in the morning, at noon, and at bedtime.   Yes [provider]  carvedilol (COREG) 12.5 MG tablet Take 12.5 mg by mouth 2 (two) times daily with a meal. 02/07/19  Yes [provider]  lovastatin (MEVACOR) 20 MG tablet Take 20 mg by mouth daily after supper.   Yes [provider]  meloxicam (MOBIC) 15 MG tablet Take 15 mg by mouth daily.   Yes [provider]  NON FORMULARY See admin instructions. Supplement: Give 240 med pass by mouth twice daily   Yes [provider]  Nutritional Supplements (ENSURE PO) Take 237 mLs by mouth in the morning and at bedtime.   Yes [provider]  pantoprazole (PROTONIX) 20 MG tablet Take 20 mg by mouth daily.   Yes [provider]  polyethylene glycol (MIRALAX / GLYCOLAX) 17 g packet Take 17 g by mouth daily. 4-8 OZ OF WATER AND DRINK BY MOUTH ONCE DAILY AS NEEDED FOR CONSTIPATION   Yes [provider]  Pramox-PE-Glycerin-Petrolatum (HEMORRHOIDAL EX) CREAM APPLY TOPICALLY TO EXTERNAL HEMORRHOIDS TWICE DAILY AS NEEDED - IRRITATION   Yes [provider]  sennosides-docusate sodium (SENOKOT-S) 8.6-50 MG tablet  Take 2 tablets by mouth daily.   Yes [provider]  NON FORMULARY diet clarification - pureed Heart healthy diet    [provider]  pantoprazole (PROTONIX) 40 MG tablet Take 1 tablet (40 mg total) by mouth daily. Patient not taking: Reported on 07/05/2022 07/30/21   Maretta Bees, MD    Current Facility-Administered Medications  Medication Dose Route Frequency Provider Last Rate Last Admin   acetaminophen (TYLENOL) tablet 650 mg  650 mg Oral Q6H PRN Jill Clos, MD       Or   acetaminophen (TYLENOL) suppository 650 mg  650 mg Rectal Q6H PRN Jill Clos, MD       aspirin chewable tablet 81 mg  81 mg Oral Q1500 Jill Clos, MD       carvedilol (COREG) tablet 12.5 mg  12.5 mg Oral BID WC Jill Clos, MD   12.5 mg at 07/05/22 2304   enoxaparin (LOVENOX) injection 30 mg  30 mg Subcutaneous Q24H Jill Clos, MD   30 mg at 07/06/22 0536   insulin aspart (novoLOG) injection 0-6 Units  0-6 Units Subcutaneous TID WC Jill Clos, MD       lactated ringers infusion   Intravenous Continuous Zigmund Daniel., MD       pantoprazole (PROTONIX) EC tablet 20 mg  20 mg Oral Daily Jill Clos, MD       pravastatin (PRAVACHOL) tablet 20 mg  20 mg Oral q1800 Jill Clos, MD       Current Outpatient Medications  Medication Sig Dispense Refill   acetaminophen (TYLENOL) 650 MG CR tablet Take 650 mg by mouth in the morning, at noon, and at bedtime.     ammonium lactate (AMLACTIN) 12 % cream apply to both feet daily until resolved     ASPERCREME ARTHRITIS PAIN 1 % GEL Apply 2-4 g topically 4 (four) times daily as needed (for pain).     aspirin 81 MG chewable tablet Chew 81 mg by mouth daily at 3 pm.      BIOFREEZE 4 % GEL Apply 1 application topically in the morning, at noon, and at bedtime.     carvedilol (COREG) 12.5 MG tablet Take 12.5 mg by mouth 2 (two) times daily with a meal.     lovastatin (MEVACOR) 20 MG tablet Take 20 mg by mouth daily after supper.     meloxicam (MOBIC) 15 MG tablet Take 15 mg by mouth daily.     NON FORMULARY See admin instructions. Supplement: Give 240 med pass by mouth twice daily     Nutritional Supplements (ENSURE PO) Take 237 mLs by mouth in the morning and at bedtime.      pantoprazole (PROTONIX) 20 MG tablet Take 20 mg by mouth daily.     polyethylene glycol (MIRALAX / GLYCOLAX) 17 g packet Take 17 g by mouth daily. 4-8 OZ OF WATER AND DRINK BY MOUTH ONCE DAILY AS NEEDED FOR CONSTIPATION     Pramox-PE-Glycerin-Petrolatum (HEMORRHOIDAL EX) CREAM APPLY TOPICALLY TO EXTERNAL HEMORRHOIDS TWICE DAILY AS NEEDED - IRRITATION     sennosides-docusate sodium (SENOKOT-S) 8.6-50 MG tablet Take 2 tablets by mouth daily.     NON FORMULARY diet clarification - pureed Heart healthy diet     pantoprazole (PROTONIX) 40 MG tablet Take 1 tablet (40 mg total) by mouth daily. (Patient not taking: Reported on 07/05/2022)      Allergies as of 07/05/2022 - Review Complete 07/05/2022  Allergen  Reaction Noted   Tape Other (See Comments) 07/27/2021   Sulfa antibiotics Hives 04/29/2014    Social History   Socioeconomic History   Marital status: Married    Spouse name: Not on file   Number of children: Not on file   Years of education: Not on file   Highest education level: Not on file  Occupational History   Not on file  Tobacco Use   Smoking status: Former    Years: 20.00    Types: Cigarettes    Quit date: 04/30/1963    Years since quitting: 59.2   Smokeless tobacco: Never  Vaping Use   Vaping Use: Never used  Substance and Sexual Activity   Alcohol use: No   Drug use: No   Sexual activity: Not on file  Other Topics Concern   Not on file  Social History Narrative   Not on file   Social Determinants of Health   Financial Resource Strain: Not on file  Food Insecurity: Not on file  Transportation Needs: Not on file  Physical Activity: Not on file  Stress: Not on file  Social Connections: Not on file  Intimate Partner Violence: Not on file    Review of Systems: All systems reviewed and negative except where noted in HPI.  Physical Exam: Vital signs in last 24 hours: Temp:  [97.5 F (36.4 C)-97.9 F (36.6 C)] 97.6 F (36.4 C) (08/10 0413) Pulse Rate:   [65-80] 65 (08/10 0500) Resp:  [15-25] 16 (08/10 0500) BP: (108-163)/(61-89) 134/62 (08/10 0500) SpO2:  [94 %-99 %] 95 % (08/10 0500) Weight:  [78.2 kg] 78.2 kg (08/09 2317)    General:  Alert female in NAD Psych:  Pleasant, cooperative. Normal mood and affect Eyes: Pupils equal, no icterus. Conjunctive pink Ears:  Normal auditory acuity Nose: No deformity, discharge or lesions Neck:  Supple, no masses felt Lungs:  Clear to auscultation.  Heart:  Regular rate, regular rhythm. No lower extremity edema Abdomen:  Soft, nondistended, nontender, active bowel sounds, no masses felt Rectal :  Deferred Msk: Symmetrical without gross deformities.  Neurologic:  Alert, oriented, grossly normal neurologically Skin:  Intact without significant lesions.    Intake/Output from previous day: 08/09 0701 - 08/10 0700 In: -  Out: 750 [Urine:750] Intake/Output this shift:  No intake/output data recorded.    Principal Problem:   Syncope Active Problems:   HTN (hypertension)   HLD (hyperlipidemia)   Cervical myelopathy (HCC)    Samanthamarie Ezzell, NP-C @  07/06/2022, 8:43 AM     

## 2022-07-06 NOTE — Assessment & Plan Note (Signed)
Appears resolved

## 2022-07-06 NOTE — Progress Notes (Signed)
PROGRESS NOTE    Jill Hernandez  [id number] DOB: [birth year] DOA: 07/05/2022 PCP: Elmore Guise, MD  Chief Complaint  Patient presents with   Loss of Consciousness    Brief Narrative:  Jill Hernandez is Jill Hernandez 88 y.o. female with history of CVA in September 2022, hypertension, chronic anemia, hyperlipidemia was brought to the ER after patient had Kasch Borquez brief episode of loss of consciousness.  Patient was complaining of abdominal discomfort for the last 3 days with abdominal distention.  While straining in the bathroom patient lost consciousness and the staff at the healthcare facility had to do CPR briefly.  Patient was brought to the ER.  Patient does not recall incident except going to the bathroom.   ED Course: In the ER patient had Bellany Elbaum CT head followed by CT chest abdomen pelvis which is showing nothing acute except for bilateral duct dilatation.  Labs show markedly elevated lipase.  At the time of my exam patient does not have any chest pain or abdominal pain.  EKG shows normal sinus rhythm with Winfield Caba QTc of 451 ms.  High sensitive troponins were negative.    Assessment & Plan:   Principal Problem:   Syncope Active Problems:   Choledocholithiasis   HTN (hypertension)   AKI (acute kidney injury) ([place])   History of ischemic stroke   Anemia   Cervical myelopathy (HCC)   Abnormal MRI, kidney   Obesity (BMI 30-39.9)   HLD (hyperlipidemia)   Assessment and Plan: * Syncope Sounds vagally mediated as occurred when she was on commode She notes she was "probably" straining Echo 07/2021 with EF [id number], grade 1 diastolic dysfunction, no AV stenosis EEG without seizures or epileptiform discharges Continue telemetry Orthostatics PT/OT  Choledocholithiasis Not that impressively symptomatic from pain/discomfort standpoint Lipase elevated ~1500, LFT's wnl MRCP with cholelithasis and choledocholithiasis, pancreas was without mass, fluid collections, or inflammatory changes GI c/s,  appreciate recs    HTN (hypertension) coreg  AKI (acute kidney injury) ([place]) Appears resolved  History of ischemic stroke Aspirin, statin  Anemia follow  Cervical myelopathy (HCC) noted  Abnormal MRI, kidney Multiple renal lesions, needs f/u MRI with and without gadolinium        DVT prophylaxis: lovenox Code Status: full Family Communication: none Disposition:   Status is: Observation The patient remains OBS appropriate and will d/c before 2 midnights.   Consultants:  GI  Procedures:  EEG  Antimicrobials:  Anti-infectives (From admission, onward)    None       Subjective: Says she passed out On commode, "probably" straining  Objective: Vitals:   07/06/22 0730 07/06/22 0800 07/06/22 0830 07/06/22 0928  BP: (!) 140/61 137/62 138/62   Pulse: 69 65 71   Resp: 17 20 20    Temp:    97.7 F (36.5 C)  TempSrc:    Oral  SpO2: 96% 95% 98%   Weight:        Intake/Output Summary (Last 24 hours) at 07/06/2022 0956 Last data filed at 07/05/2022 1944 Gross per 24 hour  Intake --  Output 750 ml  Net -750 ml   Filed Weights   07/05/22 2317  Weight: 78.2 kg    Examination:  General exam: Appears calm and comfortable  Respiratory system: unlabored Cardiovascular system: RRR Gastrointestinal system: Abdomen is nondistended, soft and nontender.  Central nervous system: Alert and oriented. No focal neurological deficits. Extremities: no LEE    Data Reviewed: I have personally reviewed following labs and imaging studies  CBC: Recent  Labs  Lab 07/05/22 1538 2022/07/15 0254  WBC 9.9 11.2*  NEUTROABS 7.7  --   HGB 11.6* 12.0  HCT 36.6 36.9  MCV 100.0 97.9  PLT 243 232    Basic Metabolic Panel: Recent Labs  Lab 07/05/22 1538 07-15-2022 0254  NA 130* 133*  K 5.2* 4.2  CL 95* 98  CO2 25 25  GLUCOSE 149* 97  BUN 36* 25*  CREATININE 1.25* 0.96  CALCIUM 9.4 9.3    GFR: Estimated Creatinine Clearance: 38.4 mL/min (by C-G formula based on  SCr of 0.96 mg/dL).  Liver Function Tests: Recent Labs  Lab 07/05/22 1538  AST 37  ALT 22  ALKPHOS 66  BILITOT 0.6  PROT 6.8  ALBUMIN 3.2*    CBG: Recent Labs  Lab 07-15-2022 0855  GLUCAP 103*     No results found for this or any previous visit (from the past 240 hour(s)).       Radiology Studies: EEG adult  Result Date: 07-15-2022 Charlsie Quest, MD     Jul 15, 2022  8:29 AM Patient Name: ALLANI REBER MRN: [id number] Epilepsy Attending: Charlsie Quest Referring Physician/Provider: Eduard Clos, MD Date: 07/05/2022 Duration: 21.40 mins Patient history: 88 year old female with syncope.  EEG evaluate for seizure. Level of alertness: Awake AEDs during EEG study: None Technical aspects: This EEG study was done with scalp electrodes positioned according to the 10-20 International system of electrode placement. Electrical activity was reviewed with band pass filter of 1-70Hz , sensitivity of 7 uV/mm, display speed of 7mm/sec with Angelize Ryce 60Hz  notched filter applied as appropriate. EEG data were recorded continuously and digitally stored.  Video monitoring was available and reviewed as appropriate. Description: The posterior dominant rhythm consists of 8 Hz activity of moderate voltage (25-35 uV) seen predominantly in posterior head regions, symmetric and reactive to eye opening and eye closing. Hyperventilation and photic stimulation were not performed.   IMPRESSION: This study is within normal limits. No seizures or epileptiform discharges were seen throughout the recording. Sumiye Hirth normal interictal EEG does not exclude nor support the diagnosis of epilepsy.   MR ABDOMEN MRCP WO CONTRAST  Result Date: 15-Jul-2022 CLINICAL DATA:  88 year old female with history of abdominal discomfort. Evaluate for cholelithiasis. EXAM: MRI ABDOMEN WITHOUT CONTRAST  (INCLUDING MRCP) TECHNIQUE: Multiplanar multisequence MR imaging of the abdomen was performed. Heavily T2-weighted images of  the biliary and pancreatic ducts were obtained, and three-dimensional MRCP images were rendered by post processing. COMPARISON:  No prior abdominal MRI. CT of the abdomen and pelvis 07/05/2022. FINDINGS: Comment: Portions of today's examination are considerably limited by patient respiratory motion. Additionally, today's study is limited for detection and characterization of visceral and/or vascular lesions by lack of IV gadolinium. Lower chest: Unremarkable. Hepatobiliary: No suspicious cystic or solid hepatic lesions are confidently identified on today's noncontrast examination. There are filling defects in the gallbladder, compatible with gallstones, measuring up to 1.2 cm. Gallbladder is not distended. Gallbladder wall thickness is normal. No pericholecystic fluid or surrounding inflammatory changes. Mild intra and extrahepatic biliary ductal dilatation. MRCP images demonstrate filling defects in the common bile duct measuring up to 7 mm (image 51 of series 18), and in the distal cystic duct measuring up to 4 mm (image 51 of series 18). Pancreas: No definite pancreatic mass or peripancreatic fluid collections or inflammatory changes noted on today's noncontrast examination. No pancreatic ductal dilatation noted on MRCP images. Spleen:  Unremarkable. Adrenals/Urinary Tract: Multiple renal lesions bilaterally which are T1 hypointense and  variable signal intensity on T2 weighted images ranging from T2 hyper to hypointense, incompletely characterized on today's noncontrast examination, but statistically likely to represent Devontre Siedschlag combination of simple and proteinaceous/hemorrhagic cysts. No hydroureteronephrosis in the visualized portions of the abdomen. Bilateral adrenal glands are normal in appearance. Stomach/Bowel: Numerous colonic diverticulae are noted. Visualized portions are otherwise unremarkable. Vascular/Lymphatic: No aneurysm identified in the visualized abdominal vasculature. No lymphadenopathy noted in the  abdomen. Other: No significant volume of ascites noted in the visualized portions of the peritoneal cavity. Musculoskeletal: No aggressive appearing osseous lesions are noted in the visualized portions of the skeleton. IMPRESSION: 1. Study is positive for cholelithiasis as well as choledocholithiasis. There are stones not only in the gallbladder, but in the distal cystic duct and in the common bile duct. This is associated with mild intra and extrahepatic biliary ductal dilatation. No overt imaging findings of acute cholecystitis are noted at this time. 2. Colonic diverticulosis without evidence of acute diverticulitis at this time. 3. Multiple renal lesions bilaterally, incompletely characterized on today's noncontrast examination, but statistically likely Narelle Schoening combination of proteinaceous and hemorrhagic cysts. Follow-up abdominal MRI with and without IV gadolinium could be considered if of clinical concern. Electronically Signed   By: Vinnie Langton M.D.   On: 07/06/2022 07:07   CT HEAD WO CONTRAST (5MM)  Result Date: 07/05/2022 CLINICAL DATA:  Mental status change, unknown cause Unwitnessed syncopal event in the bathroom. EXAM: CT HEAD WITHOUT CONTRAST TECHNIQUE: Contiguous axial images were obtained from the base of the skull through the vertex without intravenous contrast. RADIATION DOSE REDUCTION: This exam was performed according to the departmental dose-optimization program which includes automated exposure control, adjustment of the mA and/or kV according to patient size and/or use of iterative reconstruction technique. COMPARISON:  Head CT and brain MRI 07/27/2021 FINDINGS: Brain: No acute intracranial hemorrhage. There is encephalomalacia in the right occipital lobe that is new from prior exam. No evidence of acute ischemia. There is no hydrocephalus, mass effect, or midline shift. No extra-axial or subdural collection. Normal for age atrophy with mild periventricular chronic small vessel ischemia.  Vascular: Atherosclerosis of skullbase vasculature without hyperdense vessel or abnormal calcification. Skull: No acute findings. Sinuses/Orbits: Chronic opacification of right side of sphenoid sinus, increased from prior exam. No mastoid effusion. Bilateral cataract resection. Other: None. IMPRESSION: 1. No acute intracranial abnormality. 2. Encephalomalacia in the right occipital lobe. This likely represents sequela of prior infarct, new from August of 2022. 3. Age related atrophy and mild chronic small vessel ischemia. Electronically Signed   By: Keith Rake M.D.   On: 07/05/2022 23:55   CT ABDOMEN PELVIS W CONTRAST  Result Date: 07/05/2022 CLINICAL DATA:  Pancreatitis, acute, severe.  Abdominal distention EXAM: CT ABDOMEN AND PELVIS WITH CONTRAST TECHNIQUE: Multidetector CT imaging of the abdomen and pelvis was performed using the standard protocol following bolus administration of intravenous contrast. RADIATION DOSE REDUCTION: This exam was performed according to the departmental dose-optimization program which includes automated exposure control, adjustment of the mA and/or kV according to patient size and/or use of iterative reconstruction technique. CONTRAST:  12mL OMNIPAQUE IOHEXOL 350 MG/ML SOLN COMPARISON:  None Available. FINDINGS: Lower chest: No acute abnormality.  See chest CT report Hepatobiliary: Small gallstones within the gallbladder. Mild intrahepatic and extrahepatic biliary ductal dilatation. Common bile duct measures up to 10 mm. This may be related to patient's age. No visible ductal stones. Pancreas: No focal abnormality or ductal dilatation. No surrounding inflammation to suggest acute pancreatitis Spleen: No focal abnormality.  Normal size. Adrenals/Urinary Tract: Small bilateral renal cysts. No follow-up imaging recommended. Adrenal glands unremarkable. No stones or hydronephrosis. Urinary bladder unremarkable. Stomach/Bowel: Diffuse colonic diverticulosis. No active  diverticulitis. Stomach and small bowel unremarkable. No bowel obstruction. Vascular/Lymphatic: Aortic atherosclerosis. No evidence of aneurysm or adenopathy. Reproductive: Prior hysterectomy.  No adnexal masses. Other: No free fluid or free air. Musculoskeletal: No acute bony abnormality. IMPRESSION: No CT evidence of acute pancreatitis. Diffuse colonic diverticulosis.  No active diverticulitis. Slight intrahepatic and extrahepatic biliary ductal dilatation, likely related to patient's age. Recommend correlation with LFTs. Cholelithiasis. Aortic atherosclerosis. Electronically Signed   By: Charlett Nose M.D.   On: 07/05/2022 18:27   CT Angio Chest PE W and/or Wo Contrast  Result Date: 07/05/2022 CLINICAL DATA:  Pulmonary embolism (PE) suspected, positive D-dimer EXAM: CT ANGIOGRAPHY CHEST WITH CONTRAST TECHNIQUE: Multidetector CT imaging of the chest was performed using the standard protocol during bolus administration of intravenous contrast. Multiplanar CT image reconstructions and MIPs were obtained to evaluate the vascular anatomy. RADIATION DOSE REDUCTION: This exam was performed according to the departmental dose-optimization program which includes automated exposure control, adjustment of the mA and/or kV according to patient size and/or use of iterative reconstruction technique. CONTRAST:  31mL OMNIPAQUE IOHEXOL 350 MG/ML SOLN COMPARISON:  None Available. FINDINGS: Cardiovascular: No filling defects in the pulmonary arteries to suggest pulmonary emboli. Cardiomegaly. Coronary artery and aortic calcifications. No evidence of aortic aneurysm. Mediastinum/Nodes: No mediastinal, hilar, or axillary adenopathy. Trachea and esophagus are unremarkable. Thyroid unremarkable. Lungs/Pleura: No confluent airspace opacities or effusions. Upper Abdomen: No acute findings Musculoskeletal: Chest wall soft tissues are unremarkable. No acute bony abnormality. Review of the MIP images confirms the above findings.  IMPRESSION: No evidence of pulmonary embolus. Cardiomegaly, coronary artery disease. No acute cardiopulmonary disease. Aortic Atherosclerosis (ICD10-I70.0). Electronically Signed   By: Charlett Nose M.D.   On: 07/05/2022 18:24   DG Chest Port 1 View  Result Date: 07/05/2022 CLINICAL DATA:  Syncopal episode EXAM: PORTABLE CHEST 1 VIEW COMPARISON:  07/23/2021 FINDINGS: Heart size upper limits of normal. Chronic aortic atherosclerosis. The lungs are clear. The vascularity is normal. No effusions. IMPRESSION: No active disease. Electronically Signed   By: Paulina Fusi M.D.   On: 07/05/2022 15:56        Scheduled Meds:  aspirin  81 mg Oral Q1500   carvedilol  12.5 mg Oral BID WC   enoxaparin (LOVENOX) injection  30 mg Subcutaneous Q24H   insulin aspart  0-6 Units Subcutaneous TID WC   pantoprazole  20 mg Oral Daily   pravastatin  20 mg Oral q1800   Continuous Infusions:  lactated ringers 125 mL/hr at 07/06/22 0912     LOS: 0 days    Time spent: over 30 min    Lacretia Nicks, MD [facility]   To contact the attending provider between 7A-7P or the covering provider during after hours 7P-7A, please log into the web site [email or web address] and access using universal [place] password for that web site. If you do not have the password, please call the hospital operator.  07/06/2022, 9:56 AM

## 2022-07-06 NOTE — Assessment & Plan Note (Addendum)
Aspirin, statin Head CT with sequela of prior infarct, new from August 2022 -> follow with PCP outpatient

## 2022-07-06 NOTE — ED Notes (Signed)
Mri will send for her soon

## 2022-07-06 NOTE — Consult Note (Signed)
Consultation Note   Referring Provider: [facility] PCP: Marinda Elk, MD Primary Gastroenterologist: Gentry Fitz  Reason for consultation: Choledocholithiasis  Hospital Day: 2  Assessment / Plan:    # Cholelithiasis  /choledocholithiasis. Probably has biliary pancreatitis based on elevated lipase though CT scan doesn't show pancreatitis.  Documented complaints of abdominal pain but she tells me that she has not really had any abdominal pain.  No upper abdominal tenderness on exam, hemodynamically stable,  HCT 36.9% .  Continue IV hydration We need ERCP.  The benefits as well as the risk not limited to infection, bleeding, perforation, worsening pancreatitis were discussed with the patient and she agrees to proceed.  Will get a date and time for the procedure.  Keep n.p.o. for now  #Syncopal episode on toilet. Head CT without acute findings. EEG is normal. No acute EKG changes. Vasovagal with straining?   # Chronic constipation  # See PMH for additional medical problems   HPI   Jill Hernandez is a 88 y.o. female with a past medical history significant for  DM, CVA, HTN, HLD, OA, GERD, diverticulosis , cholelithiasis / choledocholithiasis.  See PMH for any additional medical problems.  Patient presented to ED from [facility] for evaluation of abdominal pain and constipation as well as a syncopal episode on toilet.   ED workup:  Sodium low at 130, potassium 5.2, creatinine elevated at 1.25 Lipase 1568 Liver chemistries normal Normal WBC Hemoglobin 11.6 (baseline 10.5), normal platelet count  EKG shows normal sinus rhythm  CT head - without acute findings  CT of chest abdomen and pelvis -small gallstone in the gallbladder mild intra hepatic and extrahepatic biliary duct dilation.CBD measuring 10 mm.no visible stones in the duct.pancreas without evidence of pancreatitis.without acute findings MRCP -Study is positive for  cholelithiasis as well as choledocholithiasis. There are stones not only in the gallbladder, but in the distal cystic duct and in the common bile duct. This is associated with mild intra and extrahepatic biliary ductal dilatation. No overt imaging findings of acute cholecystitis are noted at this time.  Jill Hernandez tells me that she really hasn't had any abdominal pain. Her only complaint is that of constipation which is a chronic issue. She takes stools softeners and also MOM / miralax as needed. Occasionally has blood in her stools.   Previous GI Evaluation      Recent Labs and Imaging EEG adult  Result Date: 07/06/2022 Charlsie Quest, MD     07/06/2022  8:29 AM Patient Name: Jill Hernandez MRN: [id number] Epilepsy Attending: Charlsie Quest Referring Physician/Provider: Eduard Clos, MD Date: 07/05/2022 Duration: 21.40 mins Patient history: 88 year old female with syncope.  EEG evaluate for seizure. Level of alertness: Awake AEDs during EEG study: None Technical aspects: This EEG study was done with scalp electrodes positioned according to the 10-20 International system of electrode placement. Electrical activity was reviewed with band pass filter of 1-70Hz , sensitivity of 7 uV/mm, display speed of 80mm/sec with a 60Hz  notched filter applied as appropriate. EEG data were recorded continuously and digitally stored.  Video monitoring was available and reviewed as appropriate. Description: The posterior dominant rhythm consists of 8 Hz activity of moderate voltage (25-35 uV) seen predominantly in posterior head  regions, symmetric and reactive to eye opening and eye closing. Hyperventilation and photic stimulation were not performed.   IMPRESSION: This study is within normal limits. No seizures or epileptiform discharges were seen throughout the recording. A normal interictal EEG does not exclude nor support the diagnosis of epilepsy. Charlsie Quest   MR ABDOMEN MRCP WO CONTRAST  Result  Date: 07/06/2022 CLINICAL DATA:  88 year old female with history of abdominal discomfort. Evaluate for cholelithiasis. EXAM: MRI ABDOMEN WITHOUT CONTRAST  (INCLUDING MRCP) TECHNIQUE: Multiplanar multisequence MR imaging of the abdomen was performed. Heavily T2-weighted images of the biliary and pancreatic ducts were obtained, and three-dimensional MRCP images were rendered by post processing. COMPARISON:  No prior abdominal MRI. CT of the abdomen and pelvis 07/05/2022. FINDINGS: Comment: Portions of today's examination are considerably limited by patient respiratory motion. Additionally, today's study is limited for detection and characterization of visceral and/or vascular lesions by lack of IV gadolinium. Lower chest: Unremarkable. Hepatobiliary: No suspicious cystic or solid hepatic lesions are confidently identified on today's noncontrast examination. There are filling defects in the gallbladder, compatible with gallstones, measuring up to 1.2 cm. Gallbladder is not distended. Gallbladder wall thickness is normal. No pericholecystic fluid or surrounding inflammatory changes. Mild intra and extrahepatic biliary ductal dilatation. MRCP images demonstrate filling defects in the common bile duct measuring up to 7 mm (image 51 of series 18), and in the distal cystic duct measuring up to 4 mm (image 51 of series 18). Pancreas: No definite pancreatic mass or peripancreatic fluid collections or inflammatory changes noted on today's noncontrast examination. No pancreatic ductal dilatation noted on MRCP images. Spleen:  Unremarkable. Adrenals/Urinary Tract: Multiple renal lesions bilaterally which are T1 hypointense and variable signal intensity on T2 weighted images ranging from T2 hyper to hypointense, incompletely characterized on today's noncontrast examination, but statistically likely to represent a combination of simple and proteinaceous/hemorrhagic cysts. No hydroureteronephrosis in the visualized portions of the  abdomen. Bilateral adrenal glands are normal in appearance. Stomach/Bowel: Numerous colonic diverticulae are noted. Visualized portions are otherwise unremarkable. Vascular/Lymphatic: No aneurysm identified in the visualized abdominal vasculature. No lymphadenopathy noted in the abdomen. Other: No significant volume of ascites noted in the visualized portions of the peritoneal cavity. Musculoskeletal: No aggressive appearing osseous lesions are noted in the visualized portions of the skeleton. IMPRESSION: 1. Study is positive for cholelithiasis as well as choledocholithiasis. There are stones not only in the gallbladder, but in the distal cystic duct and in the common bile duct. This is associated with mild intra and extrahepatic biliary ductal dilatation. No overt imaging findings of acute cholecystitis are noted at this time. 2. Colonic diverticulosis without evidence of acute diverticulitis at this time. 3. Multiple renal lesions bilaterally, incompletely characterized on today's noncontrast examination, but statistically likely a combination of proteinaceous and hemorrhagic cysts. Follow-up abdominal MRI with and without IV gadolinium could be considered if of clinical concern. Electronically Signed   By: Trudie Reed M.D.   On: 07/06/2022 07:07   CT HEAD WO CONTRAST ( )  Result Date: 07/05/2022 CLINICAL DATA:  Mental status change, unknown cause Unwitnessed syncopal event in the bathroom. EXAM: CT HEAD WITHOUT CONTRAST TECHNIQUE: Contiguous axial images were obtained from the base of the skull through the vertex without intravenous contrast. RADIATION DOSE REDUCTION: This exam was performed according to the departmental dose-optimization program which includes automated exposure control, adjustment of the mA and/or kV according to patient size and/or use of iterative reconstruction technique. COMPARISON:  Head CT and brain MRI 07/27/2021  FINDINGS: Brain: No acute intracranial hemorrhage. There is  encephalomalacia in the right occipital lobe that is new from prior exam. No evidence of acute ischemia. There is no hydrocephalus, mass effect, or midline shift. No extra-axial or subdural collection. Normal for age atrophy with mild periventricular chronic small vessel ischemia. Vascular: Atherosclerosis of skullbase vasculature without hyperdense vessel or abnormal calcification. Skull: No acute findings. Sinuses/Orbits: Chronic opacification of right side of sphenoid sinus, increased from prior exam. No mastoid effusion. Bilateral cataract resection. Other: None. IMPRESSION: 1. No acute intracranial abnormality. 2. Encephalomalacia in the right occipital lobe. This likely represents sequela of prior infarct, new from August of 2022. 3. Age related atrophy and mild chronic small vessel ischemia. Electronically Signed   By: Narda RutherfordMelanie  Sanford M.D.   On: 07/05/2022 23:55   CT ABDOMEN PELVIS W CONTRAST  Result Date: 07/05/2022 CLINICAL DATA:  Pancreatitis, acute, severe.  Abdominal distention EXAM: CT ABDOMEN AND PELVIS WITH CONTRAST TECHNIQUE: Multidetector CT imaging of the abdomen and pelvis was performed using the standard protocol following bolus administration of intravenous contrast. RADIATION DOSE REDUCTION: This exam was performed according to the departmental dose-optimization program which includes automated exposure control, adjustment of the mA and/or kV according to patient size and/or use of iterative reconstruction technique. CONTRAST:  80mL OMNIPAQUE IOHEXOL 350 MG/ML SOLN COMPARISON:  None Available. FINDINGS: Lower chest: No acute abnormality.  See chest CT report Hepatobiliary: Small gallstones within the gallbladder. Mild intrahepatic and extrahepatic biliary ductal dilatation. Common bile duct measures up to 10 mm. This may be related to patient's age. No visible ductal stones. Pancreas: No focal abnormality or ductal dilatation. No surrounding inflammation to suggest acute pancreatitis  Spleen: No focal abnormality.  Normal size. Adrenals/Urinary Tract: Small bilateral renal cysts. No follow-up imaging recommended. Adrenal glands unremarkable. No stones or hydronephrosis. Urinary bladder unremarkable. Stomach/Bowel: Diffuse colonic diverticulosis. No active diverticulitis. Stomach and small bowel unremarkable. No bowel obstruction. Vascular/Lymphatic: Aortic atherosclerosis. No evidence of aneurysm or adenopathy. Reproductive: Prior hysterectomy.  No adnexal masses. Other: No free fluid or free air. Musculoskeletal: No acute bony abnormality. IMPRESSION: No CT evidence of acute pancreatitis. Diffuse colonic diverticulosis.  No active diverticulitis. Slight intrahepatic and extrahepatic biliary ductal dilatation, likely related to patient's age. Recommend correlation with LFTs. Cholelithiasis. Aortic atherosclerosis. Electronically Signed   By: Charlett NoseKevin  Dover M.D.   On: 07/05/2022 18:27   CT Angio Chest PE W and/or Wo Contrast  Result Date: 07/05/2022 CLINICAL DATA:  Pulmonary embolism (PE) suspected, positive D-dimer EXAM: CT ANGIOGRAPHY CHEST WITH CONTRAST TECHNIQUE: Multidetector CT imaging of the chest was performed using the standard protocol during bolus administration of intravenous contrast. Multiplanar CT image reconstructions and MIPs were obtained to evaluate the vascular anatomy. RADIATION DOSE REDUCTION: This exam was performed according to the departmental dose-optimization program which includes automated exposure control, adjustment of the mA and/or kV according to patient size and/or use of iterative reconstruction technique. CONTRAST:  80mL OMNIPAQUE IOHEXOL 350 MG/ML SOLN COMPARISON:  None Available. FINDINGS: Cardiovascular: No filling defects in the pulmonary arteries to suggest pulmonary emboli. Cardiomegaly. Coronary artery and aortic calcifications. No evidence of aortic aneurysm. Mediastinum/Nodes: No mediastinal, hilar, or axillary adenopathy. Trachea and esophagus are  unremarkable. Thyroid unremarkable. Lungs/Pleura: No confluent airspace opacities or effusions. Upper Abdomen: No acute findings Musculoskeletal: Chest wall soft tissues are unremarkable. No acute bony abnormality. Review of the MIP images confirms the above findings. IMPRESSION: No evidence of pulmonary embolus. Cardiomegaly, coronary artery disease. No acute cardiopulmonary disease. Aortic Atherosclerosis (ICD10-I70.0).  Electronically Signed   By: Charlett Nose M.D.   On: 07/05/2022 18:24   DG Chest Port 1 View  Result Date: 07/05/2022 CLINICAL DATA:  Syncopal episode EXAM: PORTABLE CHEST 1 VIEW COMPARISON:  07/23/2021 FINDINGS: Heart size upper limits of normal. Chronic aortic atherosclerosis. The lungs are clear. The vascularity is normal. No effusions. IMPRESSION: No active disease. Electronically Signed   By: Paulina Fusi M.D.   On: 07/05/2022 15:56    Labs:  Recent Labs    07/05/22 1538 07/06/22 0254  WBC 9.9 11.2*  HGB 11.6* 12.0  HCT 36.6 36.9  PLT 243 232   Recent Labs    07/05/22 1538 07/06/22 0254  NA 130* 133*  K 5.2* 4.2  CL 95* 98  CO2 25 25  GLUCOSE 149* 97  BUN 36* 25*  CREATININE 1.25* 0.96  CALCIUM 9.4 9.3   Recent Labs    07/05/22 1538  PROT 6.8  ALBUMIN 3.2*  AST 37  ALT 22  ALKPHOS 66  BILITOT 0.6   No results for input(s): "HEPBSAG", "HCVAB", "HEPAIGM", "HEPBIGM" in the last 72 hours. No results for input(s): "LABPROT", "INR" in the last 72 hours.  Past Medical History:  Diagnosis Date   GERD (gastroesophageal reflux disease)    Hyperlipemia    Hypertension    TIA (transient ischemic attack)     Past Surgical History:  Procedure Laterality Date   ABDOMINAL HYSTERECTOMY     TONSILLECTOMY      Family History  Problem Relation Age of Onset   Hypertension Mother    Hyperlipidemia Father    Hypertension Father     Prior to Admission medications   Medication Sig Start Date End Date Taking? Authorizing Provider  acetaminophen (TYLENOL)  650 MG CR tablet Take 650 mg by mouth in the morning, at noon, and at bedtime.   Yes [provider]  ammonium lactate (AMLACTIN) 12 % cream apply to both feet daily until resolved   Yes [provider]  ASPERCREME ARTHRITIS PAIN 1 % GEL Apply 2-4 g topically 4 (four) times daily as needed (for pain).   Yes [provider]  aspirin 81 MG chewable tablet Chew 81 mg by mouth daily at 3 pm.    Yes [provider]  BIOFREEZE 4 % GEL Apply 1 application topically in the morning, at noon, and at bedtime.   Yes [provider]  carvedilol (COREG) 12.5 MG tablet Take 12.5 mg by mouth 2 (two) times daily with a meal. 02/07/19  Yes [provider]  lovastatin (MEVACOR) 20 MG tablet Take 20 mg by mouth daily after supper.   Yes [provider]  meloxicam (MOBIC) 15 MG tablet Take 15 mg by mouth daily.   Yes [provider]  NON FORMULARY See admin instructions. Supplement: Give 240 med pass by mouth twice daily   Yes [provider]  Nutritional Supplements (ENSURE PO) Take 237 mLs by mouth in the morning and at bedtime.   Yes [provider]  pantoprazole (PROTONIX) 20 MG tablet Take 20 mg by mouth daily.   Yes [provider]  polyethylene glycol (MIRALAX / GLYCOLAX) 17 g packet Take 17 g by mouth daily. 4-8 OZ OF WATER AND DRINK BY MOUTH ONCE DAILY AS NEEDED FOR CONSTIPATION   Yes [provider]  Pramox-PE-Glycerin-Petrolatum (HEMORRHOIDAL EX) CREAM APPLY TOPICALLY TO EXTERNAL HEMORRHOIDS TWICE DAILY AS NEEDED - IRRITATION   Yes [provider]  sennosides-docusate sodium (SENOKOT-S) 8.6-50 MG tablet  Take 2 tablets by mouth daily.   Yes [provider]  NON FORMULARY diet clarification - pureed Heart healthy diet    [provider]  pantoprazole (PROTONIX) 40 MG tablet Take 1 tablet (40 mg total) by mouth daily. Patient not taking: Reported on 07/05/2022 07/30/21   Maretta Bees, MD    Current Facility-Administered Medications  Medication Dose Route Frequency Provider Last Rate Last Admin   acetaminophen (TYLENOL) tablet 650 mg  650 mg Oral Q6H PRN Eduard Clos, MD       Or   acetaminophen (TYLENOL) suppository 650 mg  650 mg Rectal Q6H PRN Eduard Clos, MD       aspirin chewable tablet 81 mg  81 mg Oral Q1500 Eduard Clos, MD       carvedilol (COREG) tablet 12.5 mg  12.5 mg Oral BID WC Eduard Clos, MD   12.5 mg at 07/05/22 2304   enoxaparin (LOVENOX) injection 30 mg  30 mg Subcutaneous Q24H Eduard Clos, MD   30 mg at 07/06/22 0536   insulin aspart (novoLOG) injection 0-6 Units  0-6 Units Subcutaneous TID WC Eduard Clos, MD       lactated ringers infusion   Intravenous Continuous Zigmund Daniel., MD       pantoprazole (PROTONIX) EC tablet 20 mg  20 mg Oral Daily Eduard Clos, MD       pravastatin (PRAVACHOL) tablet 20 mg  20 mg Oral q1800 Eduard Clos, MD       Current Outpatient Medications  Medication Sig Dispense Refill   acetaminophen (TYLENOL) 650 MG CR tablet Take 650 mg by mouth in the morning, at noon, and at bedtime.     ammonium lactate (AMLACTIN) 12 % cream apply to both feet daily until resolved     ASPERCREME ARTHRITIS PAIN 1 % GEL Apply 2-4 g topically 4 (four) times daily as needed (for pain).     aspirin 81 MG chewable tablet Chew 81 mg by mouth daily at 3 pm.      BIOFREEZE 4 % GEL Apply 1 application topically in the morning, at noon, and at bedtime.     carvedilol (COREG) 12.5 MG tablet Take 12.5 mg by mouth 2 (two) times daily with a meal.     lovastatin (MEVACOR) 20 MG tablet Take 20 mg by mouth daily after supper.     meloxicam (MOBIC) 15 MG tablet Take 15 mg by mouth daily.     NON FORMULARY See admin instructions. Supplement: Give 240 med pass by mouth twice daily     Nutritional Supplements (ENSURE PO) Take 237 mLs by mouth in the morning and at bedtime.      pantoprazole (PROTONIX) 20 MG tablet Take 20 mg by mouth daily.     polyethylene glycol (MIRALAX / GLYCOLAX) 17 g packet Take 17 g by mouth daily. 4-8 OZ OF WATER AND DRINK BY MOUTH ONCE DAILY AS NEEDED FOR CONSTIPATION     Pramox-PE-Glycerin-Petrolatum (HEMORRHOIDAL EX) CREAM APPLY TOPICALLY TO EXTERNAL HEMORRHOIDS TWICE DAILY AS NEEDED - IRRITATION     sennosides-docusate sodium (SENOKOT-S) 8.6-50 MG tablet Take 2 tablets by mouth daily.     NON FORMULARY diet clarification - pureed Heart healthy diet     pantoprazole (PROTONIX) 40 MG tablet Take 1 tablet (40 mg total) by mouth daily. (Patient not taking: Reported on 07/05/2022)      Allergies as of 07/05/2022 - Review Complete 07/05/2022  Allergen  Reaction Noted   Tape Other (See Comments) 07/27/2021   Sulfa antibiotics Hives 04/29/2014    Social History   Socioeconomic History   Marital status: Married    Spouse name: Not on file   Number of children: Not on file   Years of education: Not on file   Highest education level: Not on file  Occupational History   Not on file  Tobacco Use   Smoking status: Former    Years: 20.00    Types: Cigarettes    Quit date: 04/30/1963    Years since quitting: 59.2   Smokeless tobacco: Never  Vaping Use   Vaping Use: Never used  Substance and Sexual Activity   Alcohol use: No   Drug use: No   Sexual activity: Not on file  Other Topics Concern   Not on file  Social History Narrative   Not on file   Social Determinants of Health   Financial Resource Strain: Not on file  Food Insecurity: Not on file  Transportation Needs: Not on file  Physical Activity: Not on file  Stress: Not on file  Social Connections: Not on file  Intimate Partner Violence: Not on file    Review of Systems: All systems reviewed and negative except where noted in HPI.  Physical Exam: Vital signs in last 24 hours: Temp:  [97.5 F (36.4 C)-97.9 F (36.6 C)] 97.6 F (36.4 C) (08/10 0413) Pulse Rate:   [65-80] 65 (08/10 0500) Resp:  [15-25] 16 (08/10 0500) BP: (108-163)/(61-89) 134/62 (08/10 0500) SpO2:  [94 %-99 %] 95 % (08/10 0500) Weight:  [78.2 kg] 78.2 kg (08/09 2317)    General:  Alert female in NAD Psych:  Pleasant, cooperative. Normal mood and affect Eyes: Pupils equal, no icterus. Conjunctive pink Ears:  Normal auditory acuity Nose: No deformity, discharge or lesions Neck:  Supple, no masses felt Lungs:  Clear to auscultation.  Heart:  Regular rate, regular rhythm. No lower extremity edema Abdomen:  Soft, nondistended, nontender, active bowel sounds, no masses felt Rectal :  Deferred Msk: Symmetrical without gross deformities.  Neurologic:  Alert, oriented, grossly normal neurologically Skin:  Intact without significant lesions.    Intake/Output from previous day: 08/09 0701 - 08/10 0700 In: -  Out: 750 [Urine:750] Intake/Output this shift:  No intake/output data recorded.    Principal Problem:   Syncope Active Problems:   HTN (hypertension)   HLD (hyperlipidemia)   Cervical myelopathy (HCC)    Willette Cluster, NP-C @  07/06/2022, 8:43 AM

## 2022-07-06 NOTE — Assessment & Plan Note (Addendum)
Coreg BP elevated, fluctuating, will follow

## 2022-07-06 NOTE — Assessment & Plan Note (Signed)
Multiple renal lesions, needs f/u MRI with and without gadolinium

## 2022-07-06 NOTE — Progress Notes (Signed)
EEG complete - results pending 

## 2022-07-06 NOTE — Assessment & Plan Note (Addendum)
Sounds vagally mediated as occurred when she was on commode She notes she was "probably" straining Staff did do CPR, but sounds like "pt was awake when CPR was administered", doesn't sound like true cardiac arrest Echo 07/2021 with EF 60-65%, grade 1 diastolic dysfunction, no AV stenosis Will repeat echo -> with EF 60-65%, no RWMA, grade 1 diastolic dysfunction, mildly elevated PASP EEG without seizures or epileptiform discharges Continue telemetry PT/OT

## 2022-07-07 ENCOUNTER — Encounter (HOSPITAL_COMMUNITY)
Admission: EM | Disposition: A | Discharge status: Skilled Nursing Facility | Payer: Self-pay | Source: Skilled Nursing Facility | Attending: Family Medicine

## 2022-07-07 ENCOUNTER — Inpatient Hospital Stay (HOSPITAL_COMMUNITY): Payer: Medicare Other | Admitting: Anesthesiology

## 2022-07-07 ENCOUNTER — Encounter (HOSPITAL_COMMUNITY): Payer: Self-pay | Admitting: Family Medicine

## 2022-07-07 ENCOUNTER — Inpatient Hospital Stay (HOSPITAL_COMMUNITY): Payer: Medicare Other

## 2022-07-07 DIAGNOSIS — K838 Other specified diseases of biliary tract: Secondary | ICD-10-CM | POA: Diagnosis not present

## 2022-07-07 DIAGNOSIS — K8050 Calculus of bile duct without cholangitis or cholecystitis without obstruction: Secondary | ICD-10-CM | POA: Diagnosis not present

## 2022-07-07 DIAGNOSIS — K222 Esophageal obstruction: Secondary | ICD-10-CM

## 2022-07-07 DIAGNOSIS — I10 Essential (primary) hypertension: Secondary | ICD-10-CM

## 2022-07-07 DIAGNOSIS — R55 Syncope and collapse: Secondary | ICD-10-CM | POA: Diagnosis not present

## 2022-07-07 DIAGNOSIS — K449 Diaphragmatic hernia without obstruction or gangrene: Secondary | ICD-10-CM

## 2022-07-07 DIAGNOSIS — K5710 Diverticulosis of small intestine without perforation or abscess without bleeding: Secondary | ICD-10-CM

## 2022-07-07 DIAGNOSIS — Z87891 Personal history of nicotine dependence: Secondary | ICD-10-CM

## 2022-07-07 HISTORY — PX: BALLOON DILATION: SHX5330

## 2022-07-07 HISTORY — PX: REMOVAL OF STONES: SHX5545

## 2022-07-07 HISTORY — PX: ESOPHAGOGASTRODUODENOSCOPY (EGD) WITH PROPOFOL: SHX5813

## 2022-07-07 HISTORY — PX: SPHINCTEROTOMY: SHX5544

## 2022-07-07 HISTORY — PX: ERCP: SHX5425

## 2022-07-07 LAB — GLUCOSE, CAPILLARY
Glucose-Capillary: 115 mg/dL — ABNORMAL HIGH (ref 70–99)
Glucose-Capillary: 106 mg/dL — ABNORMAL HIGH (ref 70–99)
Glucose-Capillary: 117 mg/dL — ABNORMAL HIGH (ref 70–99)
Glucose-Capillary: 187 mg/dL — ABNORMAL HIGH (ref 70–99)

## 2022-07-07 LAB — CBC WITH DIFFERENTIAL/PLATELET
Abs Immature Granulocytes: 0.03 10*3/uL (ref 0.00–0.07)
Basophils Absolute: 0 10*3/uL (ref 0.0–0.1)
Basophils Relative: 0 %
Eosinophils Absolute: 0.2 10*3/uL (ref 0.0–0.5)
Eosinophils Relative: 3 %
HCT: 34.9 % — ABNORMAL LOW (ref 36.0–46.0)
Hemoglobin: 11.4 g/dL — ABNORMAL LOW (ref 12.0–15.0)
Immature Granulocytes: 0 %
Lymphocytes Relative: 17 %
Lymphs Abs: 1.3 10*3/uL (ref 0.7–4.0)
MCH: 32.4 pg (ref 26.0–34.0)
MCHC: 32.7 g/dL (ref 30.0–36.0)
MCV: 99.1 fL (ref 80.0–100.0)
Monocytes Absolute: 1.1 10*3/uL — ABNORMAL HIGH (ref 0.1–1.0)
Monocytes Relative: 14 %
Neutro Abs: 5 10*3/uL (ref 1.7–7.7)
Neutrophils Relative %: 66 %
Platelets: 215 10*3/uL (ref 150–400)
RBC: 3.52 MIL/uL — ABNORMAL LOW (ref 3.87–5.11)
RDW: 13.7 % (ref 11.5–15.5)
WBC: 7.7 10*3/uL (ref 4.0–10.5)
nRBC: 0 % (ref 0.0–0.2)

## 2022-07-07 LAB — COMPREHENSIVE METABOLIC PANEL
ALT: 16 U/L (ref 0–44)
AST: 17 U/L (ref 15–41)
Albumin: 3 g/dL — ABNORMAL LOW (ref 3.5–5.0)
Alkaline Phosphatase: 63 U/L (ref 38–126)
Anion gap: 6 (ref 5–15)
BUN: 17 mg/dL (ref 8–23)
CO2: 27 mmol/L (ref 22–32)
Calcium: 9 mg/dL (ref 8.9–10.3)
Chloride: 101 mmol/L (ref 98–111)
Creatinine, Ser: 1.03 mg/dL — ABNORMAL HIGH (ref 0.44–1.00)
GFR, Estimated: 52 mL/min — ABNORMAL LOW (ref >60)
Glucose, Bld: 94 mg/dL (ref 70–99)
Potassium: 4.2 mmol/L (ref 3.5–5.1)
Sodium: 134 mmol/L — ABNORMAL LOW (ref 135–145)
Total Bilirubin: 0.9 mg/dL (ref 0.3–1.2)
Total Protein: 6.3 g/dL — ABNORMAL LOW (ref 6.5–8.1)

## 2022-07-07 LAB — PHOSPHORUS: Phosphorus: 2.6 mg/dL (ref 2.5–4.6)

## 2022-07-07 LAB — MAGNESIUM: Magnesium: 2.1 mg/dL (ref 1.7–2.4)

## 2022-07-07 SURGERY — ERCP, WITH INTERVENTION IF INDICATED
Anesthesia: General

## 2022-07-07 MED ORDER — PROPOFOL 10 MG/ML IV BOLUS
INTRAVENOUS | Status: DC | PRN
  Administered 2022-07-07: 100 mg via INTRAVENOUS

## 2022-07-07 MED ORDER — EPHEDRINE SULFATE-NACL 50-0.9 MG/10ML-% IV SOSY
PREFILLED_SYRINGE | INTRAVENOUS | Status: DC | PRN
  Administered 2022-07-07: 5 mg via INTRAVENOUS

## 2022-07-07 MED ORDER — SUGAMMADEX SODIUM 200 MG/2ML IV SOLN
INTRAVENOUS | Status: DC | PRN
  Administered 2022-07-07: 200 mg via INTRAVENOUS

## 2022-07-07 MED ORDER — VASOPRESSIN 20 UNIT/ML IV SOLN
INTRAVENOUS | Status: DC | PRN
  Administered 2022-07-07: 1 [IU] via INTRAVENOUS

## 2022-07-07 MED ORDER — CIPROFLOXACIN IN D5W 400 MG/200ML IV SOLN
INTRAVENOUS | Status: DC | PRN
  Administered 2022-07-07: 400 mg via INTRAVENOUS

## 2022-07-07 MED ORDER — DEXAMETHASONE SODIUM PHOSPHATE 10 MG/ML IJ SOLN
INTRAMUSCULAR | Status: DC | PRN
  Administered 2022-07-07 (×2): 5 mg via INTRAVENOUS

## 2022-07-07 MED ORDER — ROCURONIUM BROMIDE 10 MG/ML (PF) SYRINGE
PREFILLED_SYRINGE | INTRAVENOUS | Status: DC | PRN
  Administered 2022-07-07: 50 mg via INTRAVENOUS

## 2022-07-07 MED ORDER — GLUCAGON HCL RDNA (DIAGNOSTIC) 1 MG IJ SOLR
INTRAMUSCULAR | Status: AC
  Filled 2022-07-07: qty 2

## 2022-07-07 MED ORDER — INDOMETHACIN 50 MG RE SUPP
RECTAL | Status: AC
  Filled 2022-07-07: qty 2

## 2022-07-07 MED ORDER — LIDOCAINE 2% (20 MG/ML) 5 ML SYRINGE
INTRAMUSCULAR | Status: DC | PRN
  Administered 2022-07-07: 100 mg via INTRAVENOUS

## 2022-07-07 MED ORDER — PHENYLEPHRINE HCL-NACL 20-0.9 MG/250ML-% IV SOLN
INTRAVENOUS | Status: DC | PRN
  Administered 2022-07-07: 50 ug/min via INTRAVENOUS

## 2022-07-07 MED ORDER — CIPROFLOXACIN IN D5W 400 MG/200ML IV SOLN
INTRAVENOUS | Status: AC
  Filled 2022-07-07: qty 200

## 2022-07-07 MED ORDER — SODIUM CHLORIDE 0.9 % IV SOLN
1.5000 g | Freq: Once | INTRAVENOUS | Status: DC
  Filled 2022-07-07: qty 4

## 2022-07-07 MED ORDER — INDOMETHACIN 50 MG RE SUPP
100.0000 mg | Freq: Once | RECTAL | Status: AC
  Administered 2022-07-07: 100 mg via RECTAL
  Filled 2022-07-07: qty 2

## 2022-07-07 MED ORDER — PHENYLEPHRINE 80 MCG/ML (10ML) SYRINGE FOR IV PUSH (FOR BLOOD PRESSURE SUPPORT)
PREFILLED_SYRINGE | INTRAVENOUS | Status: DC | PRN
  Administered 2022-07-07: 80 ug via INTRAVENOUS
  Administered 2022-07-07: 160 ug via INTRAVENOUS
  Administered 2022-07-07: 80 ug via INTRAVENOUS

## 2022-07-07 MED ORDER — GLYCOPYRROLATE PF 0.2 MG/ML IJ SOSY
PREFILLED_SYRINGE | INTRAMUSCULAR | Status: DC | PRN
  Administered 2022-07-07: .2 mg via INTRAVENOUS

## 2022-07-07 MED ORDER — ONDANSETRON HCL 4 MG/2ML IJ SOLN
INTRAMUSCULAR | Status: DC | PRN
  Administered 2022-07-07: 4 mg via INTRAVENOUS

## 2022-07-07 MED ORDER — DICLOFENAC SUPPOSITORY 100 MG
RECTAL | Status: AC
  Filled 2022-07-07: qty 1

## 2022-07-07 MED ORDER — FENTANYL CITRATE (PF) 100 MCG/2ML IJ SOLN
INTRAMUSCULAR | Status: AC
  Filled 2022-07-07: qty 2

## 2022-07-07 MED ORDER — FENTANYL CITRATE (PF) 100 MCG/2ML IJ SOLN
INTRAMUSCULAR | Status: DC | PRN
  Administered 2022-07-07 (×2): 50 ug via INTRAVENOUS

## 2022-07-07 NOTE — TOC Initial Note (Signed)
Transition of Care [facility]) - Initial/Assessment Note    Patient Details  Name: Jill Hernandez MRN: [id number] Date of Birth: [birth year]  Transition of Care [facility]) CM/SW Contact:    Baldemar Lenis, LCSW Phone Number: 07/07/2022, 2:50 PM  Clinical Narrative:         CSW noting per chart review patient from [place]. CSW confirmed with Sonny Dandy that patient is LTC. However, patient does have SNF benefit days, if she needs rehab/skilled care pending post-acute course. Medical workup ongoing at this time. CSW to follow.          Expected Discharge Plan: Skilled Nursing Facility Barriers to Discharge: Continued Medical Work up   Patient Goals and CMS Choice Patient states their goals for this hospitalization and ongoing recovery are:: patient off unit for procedure CMS Medicare.gov Compare Post Acute Care list provided to:: Patient Choice offered to / list presented to : Patient  Expected Discharge Plan and Services Expected Discharge Plan: Skilled Nursing Facility     Post Acute Care Choice: Skilled Nursing Facility Living arrangements for the past 2 months: Skilled Nursing Facility                                      Prior Living Arrangements/Services Living arrangements for the past 2 months: Skilled Nursing Facility Lives with:: Facility Resident Patient language and need for interpreter reviewed:: No Do you feel safe going back to the place where you live?: Yes      Need for Family Participation in Patient Care: No (Comment) Care giver support system in place?: Yes (comment)   Criminal Activity/Legal Involvement Pertinent to Current Situation/Hospitalization: No - Comment as needed  Activities of Daily Living Home Assistive Devices/Equipment: Hearing aid, Wheelchair ADL Screening (condition at time of admission) Patient's cognitive ability adequate to safely complete daily activities?: Yes Is the patient deaf or have difficulty hearing?: No Does the  patient have difficulty seeing, even when wearing glasses/contacts?: No Does the patient have difficulty concentrating, remembering, or making decisions?: No Patient able to express need for assistance with ADLs?: Yes Does the patient have difficulty dressing or bathing?: Yes Independently performs ADLs?: No Communication: Needs assistance, Dependent Is this a change from baseline?: Pre-admission baseline Dressing (OT): Needs assistance, Dependent Is this a change from baseline?: Pre-admission baseline Grooming: Dependent Is this a change from baseline?: Pre-admission baseline Feeding: Dependent Is this a change from baseline?: Pre-admission baseline Bathing: Dependent Is this a change from baseline?: Pre-admission baseline Toileting: Dependent Is this a change from baseline?: Pre-admission baseline In/Out Bed: Dependent Is this a change from baseline?: Pre-admission baseline Walks in Home: Dependent Is this a change from baseline?: Pre-admission baseline Does the patient have difficulty walking or climbing stairs?: Yes Weakness of Legs: Both Weakness of Arms/Hands: Both  Permission Sought/Granted Permission sought to share information with : Facility Medical sales representative, Family Supports Permission granted to share information with : Yes, Verbal Permission Granted  Share Information with NAME: [name]  Permission granted to share info w AGENCY: [facility]  Permission granted to share info w Relationship: Daughter     Emotional Assessment   Attitude/Demeanor/Rapport: Engaged Affect (typically observed): Appropriate Orientation: : Oriented to Self, Oriented to Place, Oriented to  Time, Oriented to Situation Alcohol / Substance Use: Not Applicable Psych Involvement: No (comment)  Admission diagnosis:  Syncope and collapse [R55] Syncope [R55] Elevated lipase [R74.8] Choledocholithiasis [K80.50] Patient Active Problem List  Diagnosis Date Noted   Choledocholithiasis  07/06/2022   AKI (acute kidney injury) (HCC) 07/06/2022   Abnormal MRI, kidney 07/06/2022   Obesity (BMI 30-39.9) 07/06/2022   Elevated lipase    Cervical myelopathy (HCC) 02/07/2022   Unspecified protein-calorie malnutrition (HCC) 11/01/2021   Fatigue 11/01/2021   DM (diabetes mellitus), type 2 with peripheral vascular complications (HCC) 08/03/2021   Postural hypotension 08/03/2021   Spinal stenosis of cervical region 08/03/2021   Left sided numbness 07/27/2021   Knee osteoarthritis 07/27/2021   GERD (gastroesophageal reflux disease) 07/27/2021   Anemia 07/27/2021   History of ischemic stroke 07/27/2021   CAP (community acquired pneumonia) 04/09/2019   Small vessel disease, cerebrovascular 02/28/2016   Cerebral atrophy (HCC) 02/28/2016   Hyponatremia 02/27/2016   Syncope 04/29/2014   TIA (transient ischemic attack) 04/29/2014   HTN (hypertension) 04/29/2014   HLD (hyperlipidemia) 04/29/2014   Left knee pain 04/29/2014   PCP:  Marinda Elk, MD Pharmacy:   [facility] [address], [place] - [address] [place] [address] Phone: [phone] Fax: [phone]     Social Determinants of Health (SDOH) Interventions    Readmission Risk Interventions     No data to display

## 2022-07-07 NOTE — Anesthesia Preprocedure Evaluation (Addendum)
Anesthesia Evaluation  Patient identified by MRN, date of birth, ID band Patient awake    Reviewed: Allergy & Precautions, H&P , NPO status , Patient's Chart, lab work & pertinent test results  Airway Mallampati: I  TM Distance: >3 FB Neck ROM: Full    Dental no notable dental hx. (+) Edentulous Upper, Edentulous Lower,    Pulmonary neg pulmonary ROS, former smoker,    Pulmonary exam normal breath sounds clear to auscultation       Cardiovascular Exercise Tolerance: Good hypertension, Pt. on medications + Peripheral Vascular Disease  negative cardio ROS Normal cardiovascular exam Rhythm:Regular Rate:Normal  ECHO 8/22 EF - NL,  Valves - NL   Neuro/Psych TIAnegative neurological ROS  negative psych ROS   GI/Hepatic negative GI ROS, Neg liver ROS, GERD  Medicated,  Endo/Other  negative endocrine ROSdiabetes  Renal/GU negative Renal ROS  negative genitourinary   Musculoskeletal negative musculoskeletal ROS (+)   Abdominal   Peds negative pediatric ROS (+)  Hematology negative hematology ROS (+) Blood dyscrasia, anemia ,   Anesthesia Other Findings   Reproductive/Obstetrics negative OB ROS                            Anesthesia Physical Anesthesia Plan  ASA: 3  Anesthesia Plan: General   Post-op Pain Management: Minimal or no pain anticipated   Induction: Intravenous  PONV Risk Score and Plan: 3 and Ondansetron, Dexamethasone and Treatment may vary due to age or medical condition  Airway Management Planned: Oral ETT  Additional Equipment: None  Intra-op Plan:   Post-operative Plan: Extubation in OR  Informed Consent: I have reviewed the patients History and Physical, chart, labs and discussed the procedure including the risks, benefits and alternatives for the proposed anesthesia with the patient or authorized representative who has indicated his/her understanding and acceptance.        Plan Discussed with: Anesthesiologist and CRNA  Anesthesia Plan Comments: (  )       Anesthesia Quick Evaluation

## 2022-07-07 NOTE — Anesthesia Postprocedure Evaluation (Signed)
Anesthesia Post Note  Patient: HADIYA SPOERL  Procedure(s) Performed: ENDOSCOPIC RETROGRADE CHOLANGIOPANCREATOGRAPHY (ERCP) ESOPHAGOGASTRODUODENOSCOPY (EGD) WITH PROPOFOL BALLOON DILATION SPHINCTEROTOMY     Patient location during evaluation: PACU Anesthesia Type: General Level of consciousness: awake and alert Pain management: pain level controlled Vital Signs Assessment: post-procedure vital signs reviewed and stable Respiratory status: spontaneous breathing, nonlabored ventilation, respiratory function stable and patient connected to nasal cannula oxygen Cardiovascular status: blood pressure returned to baseline and stable Postop Assessment: no apparent nausea or vomiting Anesthetic complications: no   No notable events documented.  Last Vitals:  Vitals:   07/07/22 1617 07/07/22 1630  BP: (!) 138/54 (!) 160/74  Pulse: 76 74  Resp: 19 20  Temp:    SpO2: 91% 97%    Last Pain:  Vitals:   07/07/22 1604  TempSrc: Temporal  PainSc: 0-No pain                 Kadyn Chovan

## 2022-07-07 NOTE — Op Note (Signed)
[facility] Patient Name: Jill Hernandez Procedure Date : 07/07/2022 MRN: [id number] Attending MD: Gatha Mayer , MD Date of Birth: [birth year] CSN: [id number] Age: 88 Admit Type: Inpatient Procedure:                ERCP Indications:              Bile duct stone(s), For therapy of bile duct                            stone(s) Providers:                Gatha Mayer, MD, Dulcy Fanny, Luan Moore, Technician, Hedy Camara Referring MD:              Medicines:                General Anesthesia, Cipro 400 mg IV and diclofenac                            100 mg per rectum Complications:            No immediate complications. Estimated Blood Loss:     Estimated blood loss was minimal. Procedure:                Pre-Anesthesia Assessment:                           - Prior to the procedure, a History and Physical                            was performed, and patient medications and                            allergies were reviewed. The patient's tolerance of                            previous anesthesia was also reviewed. The risks                            and benefits of the procedure and the sedation                            options and risks were discussed with the patient.                            All questions were answered, and informed consent                            was obtained. Prior Anticoagulants: The patient has                            taken no previous anticoagulant or antiplatelet  agents. ASA Grade Assessment: II - A patient with                            mild systemic disease. After reviewing the risks                            and benefits, the patient was deemed in                            satisfactory condition to undergo the procedure.                           After obtaining informed consent, the scope was                            passed under direct vision. Throughout the                             procedure, the patient's blood pressure, pulse, and                            oxygen saturations were monitored continuously. The                            TJF-Q190V ([id number] Olympus duodenoscope was                            introduced through the mouth, and used to inject                            contrast into and used to inject contrast into the                            bile duct. The ERCP was somewhat difficult due to                            intubation difficulty. Successful completion of the                            procedure was aided by left lateral position. The                            patient tolerated the procedure well. Scope In: Scope Out: Findings:      The scout film was normal. Initial attempt to intubate with duodenoscope       was difficult - so used gastroscope to evaluate. 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 Dilated to 18 mm w/ 15-16.5-18 mm balloon - moderate mucosal       defect after - successful dilation. Small hiatal hernia seen. Stomach w/       a few diminutive polyps that appear innocent (fundic gland). Duodenum -       diverticulum - periampullary,  otherwise normal. Gastroscope withdrawn       and then intubated with duodenoscope in left lateral w/o difficulty.       Periampullary diverticulum seen above main papilla. Main papilla       cannulated via wire through sphincterotome and deep cannulation       obtained. Contrast injected and at least 1 stone seen distal duct.       Maximal extrahepatic diameter duct 9-10 mm. Intrahepatics Biliary       sphincterotomy performed over wire. Small amount of mucoid bile vs       purulence out but green bile overall. 9-12 mm balloon then inserted and       balloon sweeps and occlusion cholangiograms - 2 stones removed and none       thought remaining. During 1 insertion the cysytic duct was cannulated -        it did fill - I did not fill gallbladder. Pancreas not entered. Impression:               - Choledocholithiasis was found. Complete removal                            was accomplished by biliary sphincterotomy and                            balloon extraction.                           - The entire main bile duct was mildly dilated.                           - Esophageal stenosis. Dilated.                           - Duodenal diverticulum.                           - Small hernia. Recommendation:           - Return to floor                           [facility] consult re: cholelithiasis - consider                            cholecystectomy - I did speak to daughter who said                            patient/they are not sure they want to pursue.                            There is a risk of future problems with stones if                            no colecystectomy but not definite. Does seem like                            we found this issue incidentally given lack of  biliary symptoms though lipase was elevated ?                           Clears only today - advance diet tomorrow if ok.                           Will f/u tomorrow Procedure Code(s):        --- Professional ---                           [phone], Endoscopic retrograde                            cholangiopancreatography (ERCP); with removal of                            calculi/debris from biliary/pancreatic duct(s)                           43262, Endoscopic retrograde                            cholangiopancreatography (ERCP); with                            sphincterotomy/papillotomy                           43249, Esophagogastroduodenoscopy, flexible,                            transoral; with transendoscopic balloon dilation of                            esophagus (less than 30 mm diameter) Diagnosis Code(s):        --- Professional ---                           K80.50, Calculus of bile duct  without cholangitis                            or cholecystitis without obstruction                           K22.2, Esophageal obstruction                           K44.9, Diaphragmatic hernia without obstruction or                            gangrene                           K83.8, Other specified diseases of biliary tract                           K57.10, Diverticulosis of small intestine without  perforation or abscess without bleeding CPT copyright 2019 [employer]. All rights reserved. The codes documented in this report are preliminary and upon coder review may  be revised to meet current compliance requirements. Iva Boop, MD 07/07/2022 4:23:51 PM This report has been signed electronically. Number of Addenda: 0

## 2022-07-07 NOTE — Anesthesia Procedure Notes (Signed)
Procedure Name: Intubation Date/Time: 07/07/2022 2:54 PM  Performed by: Aundria Rud, CRNAPre-anesthesia Checklist: Patient identified, Emergency Drugs available, Suction available and Patient being monitored Patient Re-evaluated:Patient Re-evaluated prior to induction Oxygen Delivery Method: Circle System Utilized Preoxygenation: Pre-oxygenation with 100% oxygen Induction Type: IV induction Ventilation: Mask ventilation without difficulty and Oral airway inserted - appropriate to patient size Laryngoscope Size: Hyacinth Meeker and 2 Grade View: Grade I Tube type: Oral Number of attempts: 1 Airway Equipment and Method: Stylet and Oral airway Placement Confirmation: ETT inserted through vocal cords under direct vision, positive ETCO2 and breath sounds checked- equal and bilateral Secured at: 21 cm Tube secured with: Tape Dental Injury: Teeth and Oropharynx as per pre-operative assessment

## 2022-07-07 NOTE — Transfer of Care (Signed)
Immediate Anesthesia Transfer of Care Note  Patient: Jill Hernandez  Procedure(s) Performed: ENDOSCOPIC RETROGRADE CHOLANGIOPANCREATOGRAPHY (ERCP) ESOPHAGOGASTRODUODENOSCOPY (EGD) WITH PROPOFOL BALLOON DILATION SPHINCTEROTOMY  Patient Location: Endoscopy Unit  Anesthesia Type:General  Level of Consciousness: awake, alert , oriented and drowsy  Airway & Oxygen Therapy: Patient Spontanous Breathing  Post-op Assessment: Report given to RN, Post -op Vital signs reviewed and stable and Patient moving all extremities X 4  Post vital signs: Reviewed and stable  Last Vitals:  Vitals Value Taken Time  BP 175/54 07/07/22 1604  Temp 36.5 C 07/07/22 1604  Pulse 79 07/07/22 1604  Resp 12 07/07/22 1604  SpO2 95 % 07/07/22 1604    Last Pain:  Vitals:   07/07/22 1604  TempSrc: Temporal  PainSc: 0-No pain         Complications: No notable events documented.

## 2022-07-07 NOTE — Progress Notes (Signed)
PROGRESS NOTE    Jill Hernandez  FKC:[id number] DOB: 01-09-1930 DOA: 07/05/2022 PCP: Marinda Elk, MD  Chief Complaint  Patient presents with   Loss of Consciousness    Brief Narrative:  Jill Hernandez is Jill Hernandez 88 y.o. female with history of CVA in September 2022, hypertension, chronic anemia, hyperlipidemia was brought to the ER after patient had Darriel Sinquefield brief episode of loss of consciousness.  Patient was complaining of abdominal discomfort for the last 3 days with abdominal distention.  While straining in the bathroom patient lost consciousness and the staff at the healthcare facility had to do CPR briefly.  Patient was brought to the ER.  Patient does not recall incident except going to the bathroom.   ED Course: In the ER patient had Reylynn Vanalstine CT head followed by CT chest abdomen pelvis which is showing nothing acute except for bilateral duct dilatation.  Labs show markedly elevated lipase.  At the time of my exam patient does not have any chest pain or abdominal pain.  EKG shows normal sinus rhythm with Mychaela Lennartz QTc of 451 ms.  High sensitive troponins were negative.    Assessment & Plan:   Principal Problem:   Syncope Active Problems:   Choledocholithiasis   HTN (hypertension)   AKI (acute kidney injury) (HCC)   History of ischemic stroke   Anemia   Cervical myelopathy (HCC)   Abnormal MRI, kidney   Obesity (BMI 30-39.9)   HLD (hyperlipidemia)   Elevated lipase   Esophageal stricture   Assessment and Plan: * Syncope Sounds vagally mediated as occurred when she was on commode She notes she was "probably" straining Echo 07/2021 with EF 60-65%, grade 1 diastolic dysfunction, no AV stenosis EEG without seizures or epileptiform discharges Continue telemetry Orthostatics PT/OT  Choledocholithiasis Not that impressively symptomatic from pain/discomfort standpoint Lipase elevated ~1500, LFT's wnl MRCP with cholelithasis and choledocholithiasis, pancreas was without mass, fluid  collections, or inflammatory changes ERCP with choledocholithiasis, s/p biliary spinchterotomy and balloon extraction, esophageal stenosis (dilated) GI c/s, appreciate recs -> recommending surgery c/s regarding consideration of cholecystectomy    HTN (hypertension) coreg  AKI (acute kidney injury) (HCC) Appears resolved  History of ischemic stroke Aspirin, statin  Anemia follow  Cervical myelopathy (HCC) noted  Abnormal MRI, kidney Multiple renal lesions, needs f/u MRI with and without gadolinium        DVT prophylaxis: lovenox Code Status: full Family Communication: none Disposition:   Status is: Observation The patient remains OBS appropriate and will d/c before 2 midnights.   Consultants:  GI  Procedures:  EEG  Antimicrobials:  Anti-infectives (From admission, onward)    Start     Dose/Rate Route Frequency Ordered Stop   07/07/22 1445  ampicillin-sulbactam (UNASYN) 1.5 g in sodium chloride 0.9 % 100 mL IVPB  Status:  Discontinued        1.5 g 200 mL/hr over 30 Minutes Intravenous  Once 07/07/22 1433 07/07/22 1642       Subjective: Says she passed out On commode, "probably" straining  Objective: Vitals:   07/07/22 1604 07/07/22 1617 07/07/22 1630 07/07/22 1738  BP: (!) 175/54 (!) 138/54 (!) 160/74 (!) 144/60  Pulse: 79 76 74 63  Resp: 12 19 20 18   Temp: 97.7 F (36.5 C)   97.6 F (36.4 C)  TempSrc: Temporal   Oral  SpO2: 95% 91% 97% 94%  Weight:      Height:        Intake/Output Summary (Last 24 hours) at 07/07/2022  Benjamin filed at 07/07/2022 1550 Gross per 24 hour  Intake 1973.59 ml  Output 800 ml  Net 1173.59 ml   Filed Weights   07/05/22 2317 07/07/22 1429  Weight: 78.2 kg 78.2 kg    Examination:  General exam: Appears calm and comfortable  Respiratory system: unlabored Cardiovascular system: RRR Gastrointestinal system: Abdomen is nondistended, soft and nontender.  Central nervous system: Alert and oriented. No  focal neurological deficits. Extremities: no LEE    Data Reviewed: I have personally reviewed following labs and imaging studies  CBC: Recent Labs  Lab 07/05/22 1538 07/06/22 0254 07/07/22 0300  WBC 9.9 11.2* 7.7  NEUTROABS 7.7  --  5.0  HGB 11.6* 12.0 11.4*  HCT 36.6 36.9 34.9*  MCV 100.0 97.9 99.1  PLT 243 232 [id number]    Basic Metabolic Panel: Recent Labs  Lab 07/05/22 1538 07/06/22 0254 07/07/22 0300  NA 130* 133* 134*  K 5.2* 4.2 4.2  CL 95* 98 101  CO2 25 25 27   GLUCOSE 149* 97 94  BUN 36* 25* 17  CREATININE 1.25* 0.96 1.03*  CALCIUM 9.4 9.3 9.0  MG  --   --  2.1  PHOS  --   --  2.6    GFR: Estimated Creatinine Clearance: 39.1 mL/min (Modean Mccullum) (by C-G formula based on SCr of 1.03 mg/dL (H)).  Liver Function Tests: Recent Labs  Lab 07/05/22 1538 07/07/22 0300  AST 37 17  ALT 22 16  ALKPHOS 66 63  BILITOT 0.6 0.9  PROT 6.8 6.3*  ALBUMIN 3.2* 3.0*    CBG: Recent Labs  Lab 07/06/22 1622 07/06/22 2121 07/07/22 0809 07/07/22 1203 07/07/22 1734  GLUCAP 93 114* 115* 106* 117*     No results found for this or any previous visit (from the past 240 hour(s)).       Radiology Studies: DG ERCP  Result Date: 07/07/2022 CLINICAL DATA:  88 year old female with history of choledocholithiasis. EXAM: ERCP TECHNIQUE: Multiple spot images obtained with the fluoroscopic device and submitted for interpretation post-procedure. FLUOROSCOPY TIME:  40.333 mGy COMPARISON:  MRCP from 07/06/2022 FINDINGS: Duodenal scope is positioned in the second portion of the duodenum. Retrograde cholangiogram was performed demonstrating moderate intra and extrahepatic biliary ductal dilation. Suggestion of rounded filling defect in the distal common bile duct compatible with choledocholithiasis. Balloon sweep is performed. IMPRESSION: At least moderately obstructive distal common bile duct choledocholithiasis. Balloon sweep was performed. These images were submitted for radiologic  interpretation only. Please see the procedural report for the full procedural details, amount of contrast, and the fluoroscopy time utilized. Ruthann Cancer, MD [facility] Radiology Electronically Signed   By: Ruthann Cancer M.D.   On: 07/07/2022 16:06   EEG adult  Result Date: 07/06/2022 Lora Havens, MD     07/06/2022  8:29 AM Patient Name: SHERIDAN CHUKWU MRN: [id number] Epilepsy Attending: Lora Havens Referring Physician/Provider: Rise Patience, MD Date: 07/05/2022 Duration: 21.40 mins Patient history: 88 year old female with syncope.  EEG evaluate for seizure. Level of alertness: Awake AEDs during EEG study: None Technical aspects: This EEG study was done with scalp electrodes positioned according to the 10-20 International system of electrode placement. Electrical activity was reviewed with band pass filter of 1-70Hz , sensitivity of 7 uV/mm, display speed of 78mm/sec with Bobbiejo Ishikawa 60Hz  notched filter applied as appropriate. EEG data were recorded continuously and digitally stored.  Video monitoring was available and reviewed as appropriate. Description: The posterior dominant rhythm consists of  8 Hz activity of moderate voltage (25-35 uV) seen predominantly in posterior head regions, symmetric and reactive to eye opening and eye closing. Hyperventilation and photic stimulation were not performed.   IMPRESSION: This study is within normal limits. No seizures or epileptiform discharges were seen throughout the recording. Terrye Dombrosky normal interictal EEG does not exclude nor support the diagnosis of epilepsy. Lora Havens   MR ABDOMEN MRCP WO CONTRAST  Result Date: 07/06/2022 CLINICAL DATA:  88 year old female with history of abdominal discomfort. Evaluate for cholelithiasis. EXAM: MRI ABDOMEN WITHOUT CONTRAST  (INCLUDING MRCP) TECHNIQUE: Multiplanar multisequence MR imaging of the abdomen was performed. Heavily T2-weighted images of the biliary and  pancreatic ducts were obtained, and three-dimensional MRCP images were rendered by post processing. COMPARISON:  No prior abdominal MRI. CT of the abdomen and pelvis 07/05/2022. FINDINGS: Comment: Portions of today's examination are considerably limited by patient respiratory motion. Additionally, today's study is limited for detection and characterization of visceral and/or vascular lesions by lack of IV gadolinium. Lower chest: Unremarkable. Hepatobiliary: No suspicious cystic or solid hepatic lesions are confidently identified on today's noncontrast examination. There are filling defects in the gallbladder, compatible with gallstones, measuring up to 1.2 cm. Gallbladder is not distended. Gallbladder wall thickness is normal. No pericholecystic fluid or surrounding inflammatory changes. Mild intra and extrahepatic biliary ductal dilatation. MRCP images demonstrate filling defects in the common bile duct measuring up to 7 mm (image 51 of series 18), and in the distal cystic duct measuring up to 4 mm (image 51 of series 18). Pancreas: No definite pancreatic mass or peripancreatic fluid collections or inflammatory changes noted on today's noncontrast examination. No pancreatic ductal dilatation noted on MRCP images. Spleen:  Unremarkable. Adrenals/Urinary Tract: Multiple renal lesions bilaterally which are T1 hypointense and variable signal intensity on T2 weighted images ranging from T2 hyper to hypointense, incompletely characterized on today's noncontrast examination, but statistically likely to represent Izaan Kingbird combination of simple and proteinaceous/hemorrhagic cysts. No hydroureteronephrosis in the visualized portions of the abdomen. Bilateral adrenal glands are normal in appearance. Stomach/Bowel: Numerous colonic diverticulae are noted. Visualized portions are otherwise unremarkable. Vascular/Lymphatic: No aneurysm identified in the visualized abdominal vasculature. No lymphadenopathy noted in the abdomen. Other:  No significant volume of ascites noted in the visualized portions of the peritoneal cavity. Musculoskeletal: No aggressive appearing osseous lesions are noted in the visualized portions of the skeleton. IMPRESSION: 1. Study is positive for cholelithiasis as well as choledocholithiasis. There are stones not only in the gallbladder, but in the distal cystic duct and in the common bile duct. This is associated with mild intra and extrahepatic biliary ductal dilatation. No overt imaging findings of acute cholecystitis are noted at this time. 2. Colonic diverticulosis without evidence of acute diverticulitis at this time. 3. Multiple renal lesions bilaterally, incompletely characterized on today's noncontrast examination, but statistically likely Reinhart Saulters combination of proteinaceous and hemorrhagic cysts. Follow-up abdominal MRI with and without IV gadolinium could be considered if of clinical concern. Electronically Signed   By: Vinnie Langton M.D.   On: 07/06/2022 07:07   CT HEAD WO CONTRAST (5MM)  Result Date: 07/05/2022 CLINICAL DATA:  Mental status change, unknown cause Unwitnessed syncopal event in the bathroom. EXAM: CT HEAD WITHOUT CONTRAST TECHNIQUE: Contiguous axial images were obtained from the base of the skull through the vertex without intravenous contrast. RADIATION DOSE REDUCTION: This exam was performed according to the departmental dose-optimization program which includes automated exposure control, adjustment of the mA and/or kV according to patient size and/or  use of iterative reconstruction technique. COMPARISON:  Head CT and brain MRI 07/27/2021 FINDINGS: Brain: No acute intracranial hemorrhage. There is encephalomalacia in the right occipital lobe that is new from prior exam. No evidence of acute ischemia. There is no hydrocephalus, mass effect, or midline shift. No extra-axial or subdural collection. Normal for age atrophy with mild periventricular chronic small vessel ischemia. Vascular:  Atherosclerosis of skullbase vasculature without hyperdense vessel or abnormal calcification. Skull: No acute findings. Sinuses/Orbits: Chronic opacification of right side of sphenoid sinus, increased from prior exam. No mastoid effusion. Bilateral cataract resection. Other: None. IMPRESSION: 1. No acute intracranial abnormality. 2. Encephalomalacia in the right occipital lobe. This likely represents sequela of prior infarct, new from August of 2022. 3. Age related atrophy and mild chronic small vessel ischemia. Electronically Signed   By: Narda Rutherford M.D.   On: 07/05/2022 23:55   CT ABDOMEN PELVIS W CONTRAST  Result Date: 07/05/2022 CLINICAL DATA:  Pancreatitis, acute, severe.  Abdominal distention EXAM: CT ABDOMEN AND PELVIS WITH CONTRAST TECHNIQUE: Multidetector CT imaging of the abdomen and pelvis was performed using the standard protocol following bolus administration of intravenous contrast. RADIATION DOSE REDUCTION: This exam was performed according to the departmental dose-optimization program which includes automated exposure control, adjustment of the mA and/or kV according to patient size and/or use of iterative reconstruction technique. CONTRAST:  69mL OMNIPAQUE IOHEXOL 350 MG/ML SOLN COMPARISON:  None Available. FINDINGS: Lower chest: No acute abnormality.  See chest CT report Hepatobiliary: Small gallstones within the gallbladder. Mild intrahepatic and extrahepatic biliary ductal dilatation. Common bile duct measures up to 10 mm. This may be related to patient's age. No visible ductal stones. Pancreas: No focal abnormality or ductal dilatation. No surrounding inflammation to suggest acute pancreatitis Spleen: No focal abnormality.  Normal size. Adrenals/Urinary Tract: Small bilateral renal cysts. No follow-up imaging recommended. Adrenal glands unremarkable. No stones or hydronephrosis. Urinary bladder unremarkable. Stomach/Bowel: Diffuse colonic diverticulosis. No active diverticulitis.  Stomach and small bowel unremarkable. No bowel obstruction. Vascular/Lymphatic: Aortic atherosclerosis. No evidence of aneurysm or adenopathy. Reproductive: Prior hysterectomy.  No adnexal masses. Other: No free fluid or free air. Musculoskeletal: No acute bony abnormality. IMPRESSION: No CT evidence of acute pancreatitis. Diffuse colonic diverticulosis.  No active diverticulitis. Slight intrahepatic and extrahepatic biliary ductal dilatation, likely related to patient's age. Recommend correlation with LFTs. Cholelithiasis. Aortic atherosclerosis. Electronically Signed   By: Charlett Nose M.D.   On: 07/05/2022 18:27   CT Angio Chest PE W and/or Wo Contrast  Result Date: 07/05/2022 CLINICAL DATA:  Pulmonary embolism (PE) suspected, positive D-dimer EXAM: CT ANGIOGRAPHY CHEST WITH CONTRAST TECHNIQUE: Multidetector CT imaging of the chest was performed using the standard protocol during bolus administration of intravenous contrast. Multiplanar CT image reconstructions and MIPs were obtained to evaluate the vascular anatomy. RADIATION DOSE REDUCTION: This exam was performed according to the departmental dose-optimization program which includes automated exposure control, adjustment of the mA and/or kV according to patient size and/or use of iterative reconstruction technique. CONTRAST:  5mL OMNIPAQUE IOHEXOL 350 MG/ML SOLN COMPARISON:  None Available. FINDINGS: Cardiovascular: No filling defects in the pulmonary arteries to suggest pulmonary emboli. Cardiomegaly. Coronary artery and aortic calcifications. No evidence of aortic aneurysm. Mediastinum/Nodes: No mediastinal, hilar, or axillary adenopathy. Trachea and esophagus are unremarkable. Thyroid unremarkable. Lungs/Pleura: No confluent airspace opacities or effusions. Upper Abdomen: No acute findings Musculoskeletal: Chest wall soft tissues are unremarkable. No acute bony abnormality. Review of the MIP images confirms the above findings. IMPRESSION: No evidence of  pulmonary embolus. Cardiomegaly, coronary artery disease. No acute cardiopulmonary disease. Aortic Atherosclerosis (ICD10-I70.0). Electronically Signed   By: Rolm Baptise M.D.   On: 07/05/2022 18:24        Scheduled Meds:  aspirin  81 mg Oral Q1500   carvedilol  12.5 mg Oral BID WC   diclofenac Sodium  2 g Topical QID   insulin aspart  0-6 Units Subcutaneous TID WC   pantoprazole  20 mg Oral Daily   pravastatin  20 mg Oral q1800   Continuous Infusions:  lactated ringers 125 mL/hr at 07/07/22 1444     LOS: 1 day    Time spent: over 30 min    Fayrene Helper, MD [facility]   To contact the attending provider between 7A-7P or the covering provider during after hours 7P-7A, please log into the web site [email or web address] and access using universal [place] password for that web site. If you do not have the password, please call the hospital operator.  07/07/2022, 5:49 PM

## 2022-07-07 NOTE — Interval H&P Note (Signed)
History and Physical Interval Note:  07/07/2022 2:26 PM  Jill Hernandez  has presented today for surgery, with the diagnosis of choledocholithiasis.  The various methods of treatment have been discussed with the patient and family. After consideration of risks, benefits and other options for treatment, the patient has consented to  Procedure(s): ENDOSCOPIC RETROGRADE CHOLANGIOPANCREATOGRAPHY (ERCP) (N/A) as a surgical intervention.  The patient's history has been reviewed, patient examined, no change in status, stable for surgery.  I have reviewed the patient's chart and labs.  Questions were answered to the patient's satisfaction.     Stan Head

## 2022-07-08 ENCOUNTER — Encounter (HOSPITAL_COMMUNITY): Payer: Self-pay | Admitting: Internal Medicine

## 2022-07-08 DIAGNOSIS — K5900 Constipation, unspecified: Secondary | ICD-10-CM

## 2022-07-08 DIAGNOSIS — R55 Syncope and collapse: Secondary | ICD-10-CM | POA: Diagnosis not present

## 2022-07-08 DIAGNOSIS — K8050 Calculus of bile duct without cholangitis or cholecystitis without obstruction: Secondary | ICD-10-CM | POA: Diagnosis not present

## 2022-07-08 DIAGNOSIS — K8020 Calculus of gallbladder without cholecystitis without obstruction: Secondary | ICD-10-CM | POA: Diagnosis not present

## 2022-07-08 LAB — CBC WITH DIFFERENTIAL/PLATELET
Abs Immature Granulocytes: 0.05 10*3/uL (ref 0.00–0.07)
Basophils Absolute: 0 10*3/uL (ref 0.0–0.1)
Basophils Relative: 0 %
Eosinophils Absolute: 0 10*3/uL (ref 0.0–0.5)
Eosinophils Relative: 0 %
HCT: 36.5 % (ref 36.0–46.0)
Hemoglobin: 12.1 g/dL (ref 12.0–15.0)
Immature Granulocytes: 0 %
Lymphocytes Relative: 4 %
Lymphs Abs: 0.6 10*3/uL — ABNORMAL LOW (ref 0.7–4.0)
MCH: 32.4 pg (ref 26.0–34.0)
MCHC: 33.2 g/dL (ref 30.0–36.0)
MCV: 97.6 fL (ref 80.0–100.0)
Monocytes Absolute: 0.7 10*3/uL (ref 0.1–1.0)
Monocytes Relative: 5 %
Neutro Abs: 12.7 10*3/uL — ABNORMAL HIGH (ref 1.7–7.7)
Neutrophils Relative %: 91 %
Platelets: 202 10*3/uL (ref 150–400)
RBC: 3.74 MIL/uL — ABNORMAL LOW (ref 3.87–5.11)
RDW: 13.4 % (ref 11.5–15.5)
WBC: 14.1 10*3/uL — ABNORMAL HIGH (ref 4.0–10.5)
nRBC: 0 % (ref 0.0–0.2)

## 2022-07-08 LAB — COMPREHENSIVE METABOLIC PANEL
ALT: 43 U/L (ref 0–44)
AST: 58 U/L — ABNORMAL HIGH (ref 15–41)
Albumin: 3.1 g/dL — ABNORMAL LOW (ref 3.5–5.0)
Alkaline Phosphatase: 139 U/L — ABNORMAL HIGH (ref 38–126)
Anion gap: 9 (ref 5–15)
BUN: 14 mg/dL (ref 8–23)
CO2: 25 mmol/L (ref 22–32)
Calcium: 8.7 mg/dL — ABNORMAL LOW (ref 8.9–10.3)
Chloride: 100 mmol/L (ref 98–111)
Creatinine, Ser: 0.98 mg/dL (ref 0.44–1.00)
GFR, Estimated: 55 mL/min — ABNORMAL LOW (ref >60)
Glucose, Bld: 164 mg/dL — ABNORMAL HIGH (ref 70–99)
Potassium: 3.9 mmol/L (ref 3.5–5.1)
Sodium: 134 mmol/L — ABNORMAL LOW (ref 135–145)
Total Bilirubin: 1.1 mg/dL (ref 0.3–1.2)
Total Protein: 6.7 g/dL (ref 6.5–8.1)

## 2022-07-08 LAB — GLUCOSE, CAPILLARY
Glucose-Capillary: 141 mg/dL — ABNORMAL HIGH (ref 70–99)
Glucose-Capillary: 130 mg/dL — ABNORMAL HIGH (ref 70–99)
Glucose-Capillary: 138 mg/dL — ABNORMAL HIGH (ref 70–99)
Glucose-Capillary: 139 mg/dL — ABNORMAL HIGH (ref 70–99)

## 2022-07-08 LAB — MAGNESIUM: Magnesium: 1.9 mg/dL (ref 1.7–2.4)

## 2022-07-08 LAB — PHOSPHORUS: Phosphorus: 3.6 mg/dL (ref 2.5–4.6)

## 2022-07-08 MED ORDER — POLYETHYLENE GLYCOL 3350 17 G PO PACK
17.0000 g | PACK | Freq: Two times a day (BID) | ORAL | Status: DC
  Administered 2022-07-08 – 2022-07-09 (×3): 17 g via ORAL
  Filled 2022-07-08 (×3): qty 1

## 2022-07-08 NOTE — Assessment & Plan Note (Signed)
miralax

## 2022-07-08 NOTE — Progress Notes (Addendum)
Attending physician's note   I have taken a history, reviewed the chart, and examined the patient. I performed a substantive portion of this encounter, including complete performance of at least one of the key components, in conjunction with the APP. I agree with the APP's note, impression, and recommendations with my edits.   ERCP yesterday with choledocholithiasis s/p sphincterotomy and balloon sweeps.  Was evaluated by general surgery earlier today and both patient and surgeon agree with proceeding without cholecystectomy.  Liver enzymes slight uptrend to 58/43, ALP 139, all likely 2/2 instrumentation with ERCP yesterday.  Otherwise no abdominal pain and tolerating p.o. intake.  Plan to repeat liver enzymes in 1-2 weeks as outpatient to ensure downtrending/normalization.  Can be ordered through [facility].  GI service will sign off at this time.  [address][name], DO, FACG [phone] office         Daily Progress Note  Hospital Day: 4  Chief Complaint:  bile duct stones  Brief History Jill Hernandez is a 88 y.o. female with a pmh not limited to  DM, CVA, HTN, HLD, OA, GERD, diverticulosis , cholelithiasis / choledocholithiasis. Admitted 8/9 with abdominal pain , syncope, cholelithiasis, choledocholithiasis, elevated lipase  ERCP with sphincterotomy and stone extraction on 8/12.   Assessment / Plan   Cholelithiasis /choledocholithiasis.  Probable mild biliary pancreatitis ( elevated lipase, no findings of pancreatitis on imaging) Post ERCP Dr. Leone PayorGessner spoke with daughter who was hesitant to proceed with a cholecystectomy.  Evaluated this am by General Surgery. Based on age and comorbidities a cholecystectomy was not recommended.  Okay for discharge from GI standpoint  Leukocytosis.  WBC was 7.7 yesterday, at 14.1 today. She is Afebrile.   Chronic constipation Recommend Miralax 1-2 times daily   Subjective   Throat is a little sore after ERCP. Has slightly  headache. Arthritic knees hurt. Otherwise feels okay  Objective   Endoscopic studies:   ERCP - Choledocholithiasis was found. Complete removal was accomplished by biliary sphincterotomy and balloon extraction. - The entire main bile duct was mildly dilated. - Esophageal stenosis. Dilated. - Duodenal diverticulum. - Small hernia.  Imaging:  DG ERCP  Result Date: 07/07/2022 CLINICAL DATA:  88 year old female with history of choledocholithiasis. EXAM: ERCP TECHNIQUE: Multiple spot images obtained with the fluoroscopic device and submitted for interpretation post-procedure. FLUOROSCOPY TIME:  40.333 mGy COMPARISON:  MRCP from 07/06/2022 FINDINGS: Duodenal scope is positioned in the second portion of the duodenum. Retrograde cholangiogram was performed demonstrating moderate intra and extrahepatic biliary ductal dilation. Suggestion of rounded filling defect in the distal common bile duct compatible with choledocholithiasis. Balloon sweep is performed. IMPRESSION: At least moderately obstructive distal common bile duct choledocholithiasis. Balloon sweep was performed. These images were submitted for radiologic interpretation only. Please see the procedural report for the full procedural details, amount of contrast, and the fluoroscopy time utilized. Marliss Cootsylan Suttle, MD [facility] Electronically Signed   By: Marliss Cootsylan  Suttle M.D.   On: 07/07/2022 16:06   EEG adult  Result Date: 07/06/2022 Charlsie QuestYadav, Priyanka O, MD     07/06/2022  8:29 AM Patient Name: Jill BrookSandra B [place] MRN: [id number] Epilepsy Attending: Charlsie QuestPriyanka O Yadav Referring Physician/Provider: Eduard ClosKakrakandy, Arshad N, MD Date: 07/05/2022 Duration: 21.40 mins Patient history: 88 year old female with syncope.  EEG evaluate for seizure. Level of alertness: Awake AEDs during EEG study: None Technical aspects: This EEG study was done with scalp electrodes positioned according to the 10-20 International system  of electrode placement.  Electrical activity was reviewed with band pass filter of 1-70Hz , sensitivity of 7 uV/mm, display speed of 54mm/sec with a 60Hz  notched filter applied as appropriate. EEG data were recorded continuously and digitally stored.  Video monitoring was available and reviewed as appropriate. Description: The posterior dominant rhythm consists of 8 Hz activity of moderate voltage (25-35 uV) seen predominantly in posterior head regions, symmetric and reactive to eye opening and eye closing. Hyperventilation and photic stimulation were not performed.   IMPRESSION: This study is within normal limits. No seizures or epileptiform discharges were seen throughout the recording. A normal interictal EEG does not exclude nor support the diagnosis of epilepsy.   MR ABDOMEN MRCP WO CONTRAST  Result Date: 07/06/2022 CLINICAL DATA:  88 year old female with history of abdominal discomfort. Evaluate for cholelithiasis. EXAM: MRI ABDOMEN WITHOUT CONTRAST  (INCLUDING MRCP) TECHNIQUE: Multiplanar multisequence MR imaging of the abdomen was performed. Heavily T2-weighted images of the biliary and pancreatic ducts were obtained, and three-dimensional MRCP images were rendered by post processing. COMPARISON:  No prior abdominal MRI. CT of the abdomen and pelvis 07/05/2022. FINDINGS: Comment: Portions of today's examination are considerably limited by patient respiratory motion. Additionally, today's study is limited for detection and characterization of visceral and/or vascular lesions by lack of IV gadolinium. Lower chest: Unremarkable. Hepatobiliary: No suspicious cystic or solid hepatic lesions are confidently identified on today's noncontrast examination. There are filling defects in the gallbladder, compatible with gallstones, measuring up to 1.2 cm. Gallbladder is not distended. Gallbladder wall thickness is normal. No pericholecystic fluid or surrounding inflammatory changes. Mild intra and  extrahepatic biliary ductal dilatation. MRCP images demonstrate filling defects in the common bile duct measuring up to 7 mm (image 51 of series 18), and in the distal cystic duct measuring up to 4 mm (image 51 of series 18). Pancreas: No definite pancreatic mass or peripancreatic fluid collections or inflammatory changes noted on today's noncontrast examination. No pancreatic ductal dilatation noted on MRCP images. Spleen:  Unremarkable. Adrenals/Urinary Tract: Multiple renal lesions bilaterally which are T1 hypointense and variable signal intensity on T2 weighted images ranging from T2 hyper to hypointense, incompletely characterized on today's noncontrast examination, but statistically likely to represent a combination of simple and proteinaceous/hemorrhagic cysts. No hydroureteronephrosis in the visualized portions of the abdomen. Bilateral adrenal glands are normal in appearance. Stomach/Bowel: Numerous colonic diverticulae are noted. Visualized portions are otherwise unremarkable. Vascular/Lymphatic: No aneurysm identified in the visualized abdominal vasculature. No lymphadenopathy noted in the abdomen. Other: No significant volume of ascites noted in the visualized portions of the peritoneal cavity. Musculoskeletal: No aggressive appearing osseous lesions are noted in the visualized portions of the skeleton. IMPRESSION: 1. Study is positive for cholelithiasis as well as choledocholithiasis. There are stones not only in the gallbladder, but in the distal cystic duct and in the common bile duct. This is associated with mild intra and extrahepatic biliary ductal dilatation. No overt imaging findings of acute cholecystitis are noted at this time. 2. Colonic diverticulosis without evidence of acute diverticulitis at this time. 3. Multiple renal lesions bilaterally, incompletely characterized on today's noncontrast examination, but statistically likely a combination of proteinaceous and hemorrhagic cysts. Follow-up  abdominal MRI with and without IV gadolinium could be considered if of clinical concern. Electronically Signed   By: 09/04/2022 M.D.   On: 07/06/2022 07:07   CT HEAD WO CONTRAST (09/05/2022)  Result Date: 07/05/2022 CLINICAL DATA:  Mental status change, unknown cause Unwitnessed syncopal event in the bathroom. EXAM: CT  HEAD WITHOUT CONTRAST TECHNIQUE: Contiguous axial images were obtained from the base of the skull through the vertex without intravenous contrast. RADIATION DOSE REDUCTION: This exam was performed according to the departmental dose-optimization program which includes automated exposure control, adjustment of the mA and/or kV according to patient size and/or use of iterative reconstruction technique. COMPARISON:  Head CT and brain MRI 07/27/2021 FINDINGS: Brain: No acute intracranial hemorrhage. There is encephalomalacia in the right occipital lobe that is new from prior exam. No evidence of acute ischemia. There is no hydrocephalus, mass effect, or midline shift. No extra-axial or subdural collection. Normal for age atrophy with mild periventricular chronic small vessel ischemia. Vascular: Atherosclerosis of skullbase vasculature without hyperdense vessel or abnormal calcification. Skull: No acute findings. Sinuses/Orbits: Chronic opacification of right side of sphenoid sinus, increased from prior exam. No mastoid effusion. Bilateral cataract resection. Other: None. IMPRESSION: 1. No acute intracranial abnormality. 2. Encephalomalacia in the right occipital lobe. This likely represents sequela of prior infarct, new from August of 2022. 3. Age related atrophy and mild chronic small vessel ischemia. Electronically Signed   By: Narda Rutherford M.D.   On: 07/05/2022 23:55   CT ABDOMEN PELVIS W CONTRAST  Result Date: 07/05/2022 CLINICAL DATA:  Pancreatitis, acute, severe.  Abdominal distention EXAM: CT ABDOMEN AND PELVIS WITH CONTRAST TECHNIQUE: Multidetector CT imaging of the abdomen and pelvis was  performed using the standard protocol following bolus administration of intravenous contrast. RADIATION DOSE REDUCTION: This exam was performed according to the departmental dose-optimization program which includes automated exposure control, adjustment of the mA and/or kV according to patient size and/or use of iterative reconstruction technique. CONTRAST:  63mL OMNIPAQUE IOHEXOL 350 MG/ML SOLN COMPARISON:  None Available. FINDINGS: Lower chest: No acute abnormality.  See chest CT report Hepatobiliary: Small gallstones within the gallbladder. Mild intrahepatic and extrahepatic biliary ductal dilatation. Common bile duct measures up to 10 mm. This may be related to patient's age. No visible ductal stones. Pancreas: No focal abnormality or ductal dilatation. No surrounding inflammation to suggest acute pancreatitis Spleen: No focal abnormality.  Normal size. Adrenals/Urinary Tract: Small bilateral renal cysts. No follow-up imaging recommended. Adrenal glands unremarkable. No stones or hydronephrosis. Urinary bladder unremarkable. Stomach/Bowel: Diffuse colonic diverticulosis. No active diverticulitis. Stomach and small bowel unremarkable. No bowel obstruction. Vascular/Lymphatic: Aortic atherosclerosis. No evidence of aneurysm or adenopathy. Reproductive: Prior hysterectomy.  No adnexal masses. Other: No free fluid or free air. Musculoskeletal: No acute bony abnormality. IMPRESSION: No CT evidence of acute pancreatitis. Diffuse colonic diverticulosis.  No active diverticulitis. Slight intrahepatic and extrahepatic biliary ductal dilatation, likely related to patient's age. Recommend correlation with LFTs. Cholelithiasis. Aortic atherosclerosis. Electronically Signed   By: Charlett Nose M.D.   On: 07/05/2022 18:27   CT Angio Chest PE W and/or Wo Contrast  Result Date: 07/05/2022 CLINICAL DATA:  Pulmonary embolism (PE) suspected, positive D-dimer EXAM: CT ANGIOGRAPHY CHEST WITH CONTRAST TECHNIQUE: Multidetector CT  imaging of the chest was performed using the standard protocol during bolus administration of intravenous contrast. Multiplanar CT image reconstructions and MIPs were obtained to evaluate the vascular anatomy. RADIATION DOSE REDUCTION: This exam was performed according to the departmental dose-optimization program which includes automated exposure control, adjustment of the mA and/or kV according to patient size and/or use of iterative reconstruction technique. CONTRAST:  75mL OMNIPAQUE IOHEXOL 350 MG/ML SOLN COMPARISON:  None Available. FINDINGS: Cardiovascular: No filling defects in the pulmonary arteries to suggest pulmonary emboli. Cardiomegaly. Coronary artery and aortic calcifications. No evidence of aortic aneurysm. Mediastinum/Nodes:  No mediastinal, hilar, or axillary adenopathy. Trachea and esophagus are unremarkable. Thyroid unremarkable. Lungs/Pleura: No confluent airspace opacities or effusions. Upper Abdomen: No acute findings Musculoskeletal: Chest wall soft tissues are unremarkable. No acute bony abnormality. Review of the MIP images confirms the above findings. IMPRESSION: No evidence of pulmonary embolus. Cardiomegaly, coronary artery disease. No acute cardiopulmonary disease. Aortic Atherosclerosis (ICD10-I70.0). Electronically Signed   By: Charlett Nose M.D.   On: 07/05/2022 18:24   DG Chest Port 1 View  Result Date: 07/05/2022 CLINICAL DATA:  Syncopal episode EXAM: PORTABLE CHEST 1 VIEW COMPARISON:  07/23/2021 FINDINGS: Heart size upper limits of normal. Chronic aortic atherosclerosis. The lungs are clear. The vascularity is normal. No effusions. IMPRESSION: No active disease. Electronically Signed   By: Paulina Fusi M.D.   On: 07/05/2022 15:56    Lab Results: Recent Labs    07/06/22 0254 07/07/22 0300 07/08/22 0216  WBC 11.2* 7.7 14.1*  HGB 12.0 11.4* 12.1  HCT 36.9 34.9* 36.5  PLT 232 215 202   BMET Recent Labs    07/06/22 0254 07/07/22 0300 07/08/22 0216  NA 133* 134* 134*   K 4.2 4.2 3.9  CL 98 101 100  CO2 25 27 25   GLUCOSE 97 94 164*  BUN 25* 17 14  CREATININE 0.96 1.03* 0.98  CALCIUM 9.3 9.0 8.7*   LFT Recent Labs    07/08/22 0216  PROT 6.7  ALBUMIN 3.1*  AST 58*  ALT 43  ALKPHOS 139*  BILITOT 1.1   PT/INR No results for input(s): "LABPROT", "INR" in the last 72 hours.   Scheduled inpatient medications:   aspirin  81 mg Oral Q1500   carvedilol  12.5 mg Oral BID WC   diclofenac Sodium  2 g Topical QID   insulin aspart  0-6 Units Subcutaneous TID WC   pantoprazole  20 mg Oral Daily   polyethylene glycol  17 g Oral BID   pravastatin  20 mg Oral q1800   Continuous inpatient infusions:   lactated ringers 125 mL/hr at 07/07/22 2340   PRN inpatient medications: acetaminophen **OR** acetaminophen, alum & mag hydroxide-simeth  Vital signs in last 24 hours: Temp:  [97.6 F (36.4 C)-98 F (36.7 C)] 98 F (36.7 C) (08/12 0817) Pulse Rate:  [56-79] 56 (08/12 0817) Resp:  [12-28] 18 (08/12 0817) BP: (138-175)/(53-77) 170/61 (08/12 0817) SpO2:  [91 %-97 %] 96 % (08/12 0817) Weight:  [78.2 kg] 78.2 kg (08/11 1429) Last BM Date : 07/06/22  Intake/Output Summary (Last 24 hours) at 07/08/2022 1000 Last data filed at 07/08/2022 0539 Gross per 24 hour  Intake 600 ml  Output 900 ml  Net -300 ml     Physical Exam:  General: Alert female in NAD.  Heart:  Regular rate and rhythm. No lower extremity edema Pulmonary: Normal respiratory effort Abdomen: Soft, nondistended, nontender. Normal bowel sounds.  Neurologic: Alert and oriented Psych: Pleasant. Cooperative.    Intake/Output from previous day: 08/11 0701 - 08/12 0700 In: 600 [I.V.:400; IV Piggyback:200] Out: 1700 [Urine:1700] Intake/Output this shift: No intake/output data recorded.    Principal Problem:   Syncope Active Problems:   HTN (hypertension)   HLD (hyperlipidemia)   Anemia   History of ischemic stroke   Cervical myelopathy (HCC)   Choledocholithiasis   AKI  (acute kidney injury) (HCC)   Abnormal MRI, kidney   Obesity (BMI 30-39.9)   Elevated lipase   Esophageal stricture   Constipation     LOS: 2 days   10/12 ,  NP 07/08/2022, 10:00 AM

## 2022-07-08 NOTE — Progress Notes (Signed)
PROGRESS NOTE    Jill Hernandez  SWN:[id number] DOB: 1930/01/11 DOA: 07/05/2022 PCP: Marinda Elk, MD  Chief Complaint  Patient presents with   Loss of Consciousness    Brief Narrative:  Jill Hernandez is Jill Hernandez 88 y.o. female with history of CVA in September 2022, hypertension, chronic anemia, hyperlipidemia was brought to the ER after patient had Jill Hernandez brief episode of loss of consciousness.  Patient was complaining of abdominal discomfort for the last 3 days with abdominal distention.  While straining in the bathroom patient lost consciousness and the staff at the healthcare facility had to do CPR briefly.  Patient was brought to the ER.  Patient does not recall incident except going to the bathroom.   ED Course: In the ER patient had Jill Hernandez CT head followed by CT chest abdomen pelvis which is showing nothing acute except for bilateral duct dilatation.  Labs show markedly elevated lipase.  At the time of my exam patient does not have any chest pain or abdominal pain.  EKG shows normal sinus rhythm with Jill Hernandez QTc of 451 ms.  High sensitive troponins were negative.    Assessment & Plan:   Principal Problem:   Syncope Active Problems:   Choledocholithiasis   HTN (hypertension)   AKI (acute kidney injury) (HCC)   History of ischemic stroke   Anemia   Cervical myelopathy (HCC)   Abnormal MRI, kidney   Obesity (BMI 30-39.9)   Constipation   HLD (hyperlipidemia)   Elevated lipase   Esophageal stricture   Assessment and Plan: * Syncope Sounds vagally mediated as occurred when she was on commode She notes she was "probably" straining Staff did do CPR, but sounds like "pt was awake when CPR was administered", doesn't sound like true cardiac arrest Echo 07/2021 with EF 60-65%, grade 1 diastolic dysfunction, no AV stenosis Will repeat echo  EEG without seizures or epileptiform discharges Continue telemetry Orthostatics PT/OT  Choledocholithiasis Not that impressively symptomatic from  pain/discomfort standpoint Lipase elevated ~1500, LFT's wnl MRCP with cholelithasis and choledocholithiasis, pancreas was without mass, fluid collections, or inflammatory changes ERCP with choledocholithiasis, s/p biliary spinchterotomy and balloon extraction, esophageal stenosis (dilated) GI c/s, appreciate recs -> recommending surgery c/s regarding consideration of cholecystectomy     HTN (hypertension) Coreg BP elevated, fluctuating, will follow   AKI (acute kidney injury) (HCC) Appears resolved  History of ischemic stroke Aspirin, statin  Anemia follow  Cervical myelopathy (HCC) noted  Abnormal MRI, kidney Multiple renal lesions, needs f/u MRI with and without gadolinium   Constipation miralax       DVT prophylaxis: lovenox Code Status: full Family Communication: none Disposition:   Status is: Observation The patient remains OBS appropriate and will d/c before 2 midnights.   Consultants:  GI  Procedures:  EEG  Antimicrobials:  Anti-infectives (From admission, onward)    Start     Dose/Rate Route Frequency Ordered Stop   07/07/22 1445  ampicillin-sulbactam (UNASYN) 1.5 g in sodium chloride 0.9 % 100 mL IVPB  Status:  Discontinued        1.5 g 200 mL/hr over 30 Minutes Intravenous  Once 07/07/22 1433 07/07/22 1642       Subjective: No new complaints Doesn't sound interested in surgery (discussed we'd have them come by and talk to her) C/o constipation Needs help with hearing aids Asking about what she should use for arthritis pain at home  Objective: Vitals:   07/07/22 2021 07/07/22 2311 07/08/22 0404 07/08/22 0817  BP: Marland Kitchen)  150/75 (!) 155/65 (!) 169/77 (!) 170/61  Pulse: 62 71 64 (!) 56  Resp: 19 16 18 18   Temp: 97.7 F (36.5 C) 97.7 F (36.5 C) 97.7 F (36.5 C) 98 F (36.7 C)  TempSrc: Oral Oral Oral   SpO2: 94% 93% 95% 96%  Weight:      Height:        Intake/Output Summary (Last 24 hours) at 07/08/2022 0925 Last data filed at  07/08/2022 0539 Gross per 24 hour  Intake 600 ml  Output 1700 ml  Net -1100 ml   Filed Weights   07/05/22 2317 07/07/22 1429  Weight: 78.2 kg 78.2 kg    Examination:  General: No acute distress. Cardiovascular: RRR Lungs: unlabored Abdomen: Soft, nontender, nondistended  Neurological: Alert and oriented 3. Moves all extremities 4 with equal strength. Cranial nerves II through XII grossly intact. Extremities: No clubbing or cyanosis. No edema.   Data Reviewed: I have personally reviewed following labs and imaging studies  CBC: Recent Labs  Lab 07/05/22 1538 07/06/22 0254 07/07/22 0300 07/08/22 0216  WBC 9.9 11.2* 7.7 14.1*  NEUTROABS 7.7  --  5.0 12.7*  HGB 11.6* 12.0 11.4* 12.1  HCT 36.6 36.9 34.9* 36.5  MCV 100.0 97.9 99.1 97.6  PLT 243 232 215 [id number]    Basic Metabolic Panel: Recent Labs  Lab 07/05/22 1538 07/06/22 0254 07/07/22 0300 07/08/22 0216  NA 130* 133* 134* 134*  K 5.2* 4.2 4.2 3.9  CL 95* 98 101 100  CO2 25 25 27 25   GLUCOSE 149* 97 94 164*  BUN 36* 25* 17 14  CREATININE 1.25* 0.96 1.03* 0.98  CALCIUM 9.4 9.3 9.0 8.7*  MG  --   --  2.1 1.9  PHOS  --   --  2.6 3.6    GFR: Estimated Creatinine Clearance: 41.1 mL/min (by C-G formula based on SCr of 0.98 mg/dL).  Liver Function Tests: Recent Labs  Lab 07/05/22 1538 07/07/22 0300 07/08/22 0216  AST 37 17 58*  ALT 22 16 43  ALKPHOS 66 63 139*  BILITOT 0.6 0.9 1.1  PROT 6.8 6.3* 6.7  ALBUMIN 3.2* 3.0* 3.1*    CBG: Recent Labs  Lab 07/07/22 0809 07/07/22 1203 07/07/22 1734 07/07/22 2211 07/08/22 0612  GLUCAP 115* 106* 117* 187* 141*     No results found for this or any previous visit (from the past 240 hour(s)).       Radiology Studies: DG ERCP  Result Date: 07/07/2022 CLINICAL DATA:  88 year old female with history of choledocholithiasis. EXAM: ERCP TECHNIQUE: Multiple spot images obtained with the fluoroscopic device and submitted for interpretation post-procedure.  FLUOROSCOPY TIME:  40.333 mGy COMPARISON:  MRCP from 07/06/2022 FINDINGS: Duodenal scope is positioned in the second portion of the duodenum. Retrograde cholangiogram was performed demonstrating moderate intra and extrahepatic biliary ductal dilation. Suggestion of rounded filling defect in the distal common bile duct compatible with choledocholithiasis. Balloon sweep is performed. IMPRESSION: At least moderately obstructive distal common bile duct choledocholithiasis. Balloon sweep was performed. These images were submitted for radiologic interpretation only. Please see the procedural report for the full procedural details, amount of contrast, and the fluoroscopy time utilized. Ruthann Cancer, MD [facility] Electronically Signed   By: Ruthann Cancer M.D.   On: 07/07/2022 16:06        Scheduled Meds:  aspirin  81 mg Oral Q1500   carvedilol  12.5 mg Oral BID WC   diclofenac Sodium  2 g  Topical QID   insulin aspart  0-6 Units Subcutaneous TID WC   pantoprazole  20 mg Oral Daily   polyethylene glycol  17 g Oral BID   pravastatin  20 mg Oral q1800   Continuous Infusions:  lactated ringers 125 mL/hr at 07/07/22 2340     LOS: 2 days    Time spent: over 30 min    Lacretia Nicks, MD [facility]   To contact the attending provider between 7A-7P or the covering provider during after hours 7P-7A, please log into the web site [email or web address] and access using universal [place] password for that web site. If you do not have the password, please call the hospital operator.  07/08/2022, 9:25 AM

## 2022-07-08 NOTE — Consult Note (Signed)
Reason for Consult: Choledocholithiasis Referring Physician: Dr. Ames DuraPowell  Jill Hernandez is an 88 y.o. female.  HPI: Patient is a 88 year old female who was admitted secondary to syncopal episode.  Patient has history of CVA, hypertension, hyperlipidemia.  Patient states she has a history of constipation.  States that she was straining in the bathroom and had LOC.  Patient was brought to the hospital and underwent CPR briefly.  Patient ended up going to CT scanner for chest and pelvis patient was found to have dilated common hepatic ducts.  Patient has elevated lipase.  At this time GI was consulted.  Patient underwent ERCP yesterday.  There was clearance of duct underwent sphincterotomy.  In speaking to patient she has not had any epigastric/right upper quadrant pain in the past.  She feels that her syncopal episode comes from straining.  She states she does have a long history of constipation.  General surgery was consulted secondary to common duct stones, cholelithiasis.    Past Medical History:  Diagnosis Date   GERD (gastroesophageal reflux disease)    Hyperlipemia    Hypertension    TIA (transient ischemic attack)     Past Surgical History:  Procedure Laterality Date   ABDOMINAL HYSTERECTOMY     TONSILLECTOMY      Family History  Problem Relation Age of Onset   Hypertension Mother    Hyperlipidemia Father    Hypertension Father     Social History:  reports that she quit smoking about 59 years ago. Her smoking use included cigarettes. She has never used smokeless tobacco. She reports that she does not drink alcohol and does not use drugs.  Allergies:  Allergies  Allergen Reactions   Tape Other (See Comments)    SKIN IS THIN- WILL BRUISE AND TEAR EASILY!!   Sulfa Antibiotics Hives    Medications: I have reviewed the patient's current medications.  Results for orders placed or performed during the hospital encounter of 07/05/22 (from the past 48 hour(s))  CBG  monitoring, ED     Status: None   Collection Time: 07/06/22  2:24 PM  Result Value Ref Range   Glucose-Capillary 86 70 - 99 mg/dL    Comment: Glucose reference range applies only to samples taken after fasting for at least 8 hours.  Glucose, capillary     Status: None   Collection Time: 07/06/22  4:22 PM  Result Value Ref Range   Glucose-Capillary 93 70 - 99 mg/dL    Comment: Glucose reference range applies only to samples taken after fasting for at least 8 hours.   Comment 1 Notify RN    Comment 2 Document in Chart   Glucose, capillary     Status: Abnormal   Collection Time: 07/06/22  9:21 PM  Result Value Ref Range   Glucose-Capillary 114 (H) 70 - 99 mg/dL    Comment: Glucose reference range applies only to samples taken after fasting for at least 8 hours.  CBC with Differential/Platelet     Status: Abnormal   Collection Time: 07/07/22  3:00 AM  Result Value Ref Range   WBC 7.7 4.0 - 10.5 K/uL   RBC 3.52 (L) 3.87 - 5.11 MIL/uL   Hemoglobin 11.4 (L) 12.0 - 15.0 g/dL   HCT 16.134.9 (L) 09.636.0 - 04.546.0 %   MCV 99.1 80.0 - 100.0 fL   MCH 32.4 26.0 - 34.0 pg   MCHC 32.7 30.0 - 36.0 g/dL   RDW 40.913.7 81.111.5 - 91.415.5 %   Platelets 215  150 - 400 K/uL   nRBC 0.0 0.0 - 0.2 %   Neutrophils Relative % 66 %   Neutro Abs 5.0 1.7 - 7.7 K/uL   Lymphocytes Relative 17 %   Lymphs Abs 1.3 0.7 - 4.0 K/uL   Monocytes Relative 14 %   Monocytes Absolute 1.1 (H) 0.1 - 1.0 K/uL   Eosinophils Relative 3 %   Eosinophils Absolute 0.2 0.0 - 0.5 K/uL   Basophils Relative 0 %   Basophils Absolute 0.0 0.0 - 0.1 K/uL   Immature Granulocytes 0 %   Abs Immature Granulocytes 0.03 0.00 - 0.07 K/uL    Comment: Performed at [facility], [address], [place], [place] [address]  Comprehensive metabolic panel     Status: Abnormal   Collection Time: 07/07/22  3:00 AM  Result Value Ref Range   Sodium 134 (L) 135 - 145 mmol/L   Potassium 4.2 3.5 - 5.1 mmol/L   Chloride 101 98 - 111 mmol/L   CO2 27 22 - 32 mmol/L    Glucose, Bld 94 70 - 99 mg/dL    Comment: Glucose reference range applies only to samples taken after fasting for at least 8 hours.   BUN 17 8 - 23 mg/dL   Creatinine, Ser 3.53 (H) 0.44 - 1.00 mg/dL   Calcium 9.0 8.9 - 61.4 mg/dL   Total Protein 6.3 (L) 6.5 - 8.1 g/dL   Albumin 3.0 (L) 3.5 - 5.0 g/dL   AST 17 15 - 41 U/L   ALT 16 0 - 44 U/L   Alkaline Phosphatase 63 38 - 126 U/L   Total Bilirubin 0.9 0.3 - 1.2 mg/dL   GFR, Estimated 52 (L) >60 mL/min    Comment: (NOTE) Calculated using the CKD-EPI Creatinine Equation (2021)    Anion gap 6 5 - 15    Comment: Performed at [facility], [address] [place], [place] [address]  Magnesium     Status: None   Collection Time: 07/07/22  3:00 AM  Result Value Ref Range   Magnesium 2.1 1.7 - 2.4 mg/dL    Comment: Performed at [facility], [address] [place], [place] [address]  Phosphorus     Status: None   Collection Time: 07/07/22  3:00 AM  Result Value Ref Range   Phosphorus 2.6 2.5 - 4.6 mg/dL    Comment: Performed at [facility], [address] [place], [place] [address]  Glucose, capillary     Status: Abnormal   Collection Time: 07/07/22  8:09 AM  Result Value Ref Range   Glucose-Capillary 115 (H) 70 - 99 mg/dL    Comment: Glucose reference range applies only to samples taken after fasting for at least 8 hours.   Comment 1 Notify RN    Comment 2 Document in Chart   Glucose, capillary     Status: Abnormal   Collection Time: 07/07/22 12:03 PM  Result Value Ref Range   Glucose-Capillary 106 (H) 70 - 99 mg/dL    Comment: Glucose reference range applies only to samples taken after fasting for at least 8 hours.   Comment 1 Notify RN    Comment 2 Document in Chart   Glucose, capillary     Status: Abnormal   Collection Time: 07/07/22  5:34 PM  Result Value Ref Range   Glucose-Capillary 117 (H) 70 - 99 mg/dL    Comment: Glucose reference range applies only to samples taken after fasting for at  least 8  hours.  Glucose, capillary     Status: Abnormal   Collection Time: 07/07/22 10:11 PM  Result Value Ref Range   Glucose-Capillary 187 (H) 70 - 99 mg/dL    Comment: Glucose reference range applies only to samples taken after fasting for at least 8 hours.   Comment 1 Notify RN   CBC with Differential/Platelet     Status: Abnormal   Collection Time: 07/08/22  2:16 AM  Result Value Ref Range   WBC 14.1 (H) 4.0 - 10.5 K/uL   RBC 3.74 (L) 3.87 - 5.11 MIL/uL   Hemoglobin 12.1 12.0 - 15.0 g/dL   HCT 01.7 49.4 - 49.6 %   MCV 97.6 80.0 - 100.0 fL   MCH 32.4 26.0 - 34.0 pg   MCHC 33.2 30.0 - 36.0 g/dL   RDW 75.9 16.3 - 84.6 %   Platelets 202 150 - 400 K/uL   nRBC 0.0 0.0 - 0.2 %   Neutrophils Relative % 91 %   Neutro Abs 12.7 (H) 1.7 - 7.7 K/uL   Lymphocytes Relative 4 %   Lymphs Abs 0.6 (L) 0.7 - 4.0 K/uL   Monocytes Relative 5 %   Monocytes Absolute 0.7 0.1 - 1.0 K/uL   Eosinophils Relative 0 %   Eosinophils Absolute 0.0 0.0 - 0.5 K/uL   Basophils Relative 0 %   Basophils Absolute 0.0 0.0 - 0.1 K/uL   Immature Granulocytes 0 %   Abs Immature Granulocytes 0.05 0.00 - 0.07 K/uL    Comment: Performed at [facility], [address], [place], [place] [address]  Comprehensive metabolic panel     Status: Abnormal   Collection Time: 07/08/22  2:16 AM  Result Value Ref Range   Sodium 134 (L) 135 - 145 mmol/L   Potassium 3.9 3.5 - 5.1 mmol/L   Chloride 100 98 - 111 mmol/L   CO2 25 22 - 32 mmol/L   Glucose, Bld 164 (H) 70 - 99 mg/dL    Comment: Glucose reference range applies only to samples taken after fasting for at least 8 hours.   BUN 14 8 - 23 mg/dL   Creatinine, Ser 5.70 0.44 - 1.00 mg/dL   Calcium 8.7 (L) 8.9 - 10.3 mg/dL   Total Protein 6.7 6.5 - 8.1 g/dL   Albumin 3.1 (L) 3.5 - 5.0 g/dL   AST 58 (H) 15 - 41 U/L   ALT 43 0 - 44 U/L   Alkaline Phosphatase 139 (H) 38 - 126 U/L   Total Bilirubin 1.1 0.3 - 1.2 mg/dL   GFR, Estimated 55 (L) >60 mL/min    Comment:  (NOTE) Calculated using the CKD-EPI Creatinine Equation (2021)    Anion gap 9 5 - 15    Comment: Performed at [facility], [address], [place], [place] [address]  Magnesium     Status: None   Collection Time: 07/08/22  2:16 AM  Result Value Ref Range   Magnesium 1.9 1.7 - 2.4 mg/dL    Comment: Performed at [facility], [address], [place], [place] [address]  Phosphorus     Status: None   Collection Time: 07/08/22  2:16 AM  Result Value Ref Range   Phosphorus 3.6 2.5 - 4.6 mg/dL    Comment: Performed at [facility], [address] [place], [place] [address]  Glucose, capillary     Status: Abnormal   Collection Time: 07/08/22  6:12 AM  Result Value  Ref Range   Glucose-Capillary 141 (H) 70 - 99 mg/dL    Comment: Glucose reference range applies only to samples taken after fasting for at least 8 hours.    DG ERCP  Result Date: 07/07/2022 CLINICAL DATA:  88 year old female with history of choledocholithiasis. EXAM: ERCP TECHNIQUE: Multiple spot images obtained with the fluoroscopic device and submitted for interpretation post-procedure. FLUOROSCOPY TIME:  40.333 mGy COMPARISON:  MRCP from 07/06/2022 FINDINGS: Duodenal scope is positioned in the second portion of the duodenum. Retrograde cholangiogram was performed demonstrating moderate intra and extrahepatic biliary ductal dilation. Suggestion of rounded filling defect in the distal common bile duct compatible with choledocholithiasis. Balloon sweep is performed. IMPRESSION: At least moderately obstructive distal common bile duct choledocholithiasis. Balloon sweep was performed. These images were submitted for radiologic interpretation only. Please see the procedural report for the full procedural details, amount of contrast, and the fluoroscopy time utilized. Marliss Coots, MD [facility] Electronically Signed   By: Marliss Coots M.D.   On: 07/07/2022 16:06     Review of Systems  Constitutional:  Negative for chills and fever.  HENT:  Negative for ear discharge, hearing loss and sore throat.   Eyes:  Negative for discharge.  Respiratory:  Negative for cough and shortness of breath.   Cardiovascular:  Negative for chest pain and leg swelling.  Gastrointestinal:  Negative for abdominal pain, constipation, diarrhea, nausea and vomiting.  Musculoskeletal:  Negative for myalgias and neck pain.  Skin:  Negative for rash.  Allergic/Immunologic: Negative for environmental allergies.  Neurological:  Negative for dizziness and seizures.  Hematological:  Does not bruise/bleed easily.  Psychiatric/Behavioral:  Negative for suicidal ideas.   All other systems reviewed and are negative.  Blood pressure (!) 170/61, pulse (!) 56, temperature 98 F (36.7 C), resp. rate 18, height 5\' 6"  (1.676 m), weight 78.2 kg, SpO2 96 %. Physical Exam Constitutional:      Appearance: She is well-developed.     Comments: Conversant No acute distress  HENT:     Head: Normocephalic and atraumatic.  Eyes:     General: Lids are normal. No scleral icterus.    Pupils: Pupils are equal, round, and reactive to light.     Comments: Pupils are equal round and reactive No lid lag Moist conjunctiva  Neck:     Thyroid: No thyromegaly.     Trachea: No tracheal tenderness.     Comments: No cervical lymphadenopathy Cardiovascular:     Rate and Rhythm: Normal rate and regular rhythm.     Heart sounds: No murmur heard. Pulmonary:     Effort: Pulmonary effort is normal.     Breath sounds: Normal breath sounds. No wheezing or rales.  Abdominal:     Tenderness: There is no abdominal tenderness.     Hernia: No hernia is present.  Musculoskeletal:     Cervical back: Normal range of motion and neck supple.  Skin:    General: Skin is warm.     Findings: No rash.     Nails: There is no clubbing.     Comments: Normal skin turgor  Neurological:     Mental Status: She is alert  and oriented to person, place, and time.     Comments: Normal gait and station  Psychiatric:        Mood and Affect: Mood normal.        Thought Content: Thought content normal.        Judgment: Judgment  normal.     Comments: Appropriate affect     Assessment/Plan: 88 year old female syncopal episode Choledocholithiasis status post ERCP and sphincterotomy Cholelithiasis  1.  At this time due to patient's age comorbidities would recommend foregoing surgery.  Patient does have a sphincterotomy if she did have common duct stones these would likely pass.  Patient states that she would not want surgery at this point.  2.  If patient does have further symptoms patient can follow-up as outpatient if needed.   Axel Filler 07/08/2022, 9:54 AM

## 2022-07-09 ENCOUNTER — Inpatient Hospital Stay (HOSPITAL_COMMUNITY): Payer: Medicare Other

## 2022-07-09 DIAGNOSIS — R41841 Cognitive communication deficit: Secondary | ICD-10-CM | POA: Diagnosis not present

## 2022-07-09 DIAGNOSIS — G459 Transient cerebral ischemic attack, unspecified: Secondary | ICD-10-CM | POA: Diagnosis not present

## 2022-07-09 DIAGNOSIS — R55 Syncope and collapse: Secondary | ICD-10-CM

## 2022-07-09 DIAGNOSIS — E1151 Type 2 diabetes mellitus with diabetic peripheral angiopathy without gangrene: Secondary | ICD-10-CM | POA: Diagnosis not present

## 2022-07-09 DIAGNOSIS — K8050 Calculus of bile duct without cholangitis or cholecystitis without obstruction: Secondary | ICD-10-CM | POA: Diagnosis not present

## 2022-07-09 DIAGNOSIS — Z741 Need for assistance with personal care: Secondary | ICD-10-CM | POA: Diagnosis not present

## 2022-07-09 DIAGNOSIS — K222 Esophageal obstruction: Secondary | ICD-10-CM | POA: Diagnosis not present

## 2022-07-09 DIAGNOSIS — I69391 Dysphagia following cerebral infarction: Secondary | ICD-10-CM | POA: Diagnosis not present

## 2022-07-09 DIAGNOSIS — K5900 Constipation, unspecified: Secondary | ICD-10-CM | POA: Diagnosis not present

## 2022-07-09 DIAGNOSIS — M6281 Muscle weakness (generalized): Secondary | ICD-10-CM | POA: Diagnosis not present

## 2022-07-09 DIAGNOSIS — Z09 Encounter for follow-up examination after completed treatment for conditions other than malignant neoplasm: Secondary | ICD-10-CM | POA: Diagnosis not present

## 2022-07-09 DIAGNOSIS — I639 Cerebral infarction, unspecified: Secondary | ICD-10-CM | POA: Diagnosis not present

## 2022-07-09 DIAGNOSIS — I10 Essential (primary) hypertension: Secondary | ICD-10-CM | POA: Diagnosis not present

## 2022-07-09 DIAGNOSIS — R1310 Dysphagia, unspecified: Secondary | ICD-10-CM | POA: Diagnosis not present

## 2022-07-09 DIAGNOSIS — E785 Hyperlipidemia, unspecified: Secondary | ICD-10-CM | POA: Diagnosis not present

## 2022-07-09 DIAGNOSIS — D649 Anemia, unspecified: Secondary | ICD-10-CM | POA: Diagnosis not present

## 2022-07-09 DIAGNOSIS — I679 Cerebrovascular disease, unspecified: Secondary | ICD-10-CM | POA: Diagnosis not present

## 2022-07-09 DIAGNOSIS — R2681 Unsteadiness on feet: Secondary | ICD-10-CM | POA: Diagnosis not present

## 2022-07-09 LAB — ECHOCARDIOGRAM COMPLETE
Area-P 1/2: 3 cm2
Height: 66 in
MV VTI: 1.42 cm2
S' Lateral: 2.4 cm
Weight: 2758.4 oz

## 2022-07-09 LAB — GLUCOSE, CAPILLARY
Glucose-Capillary: 95 mg/dL (ref 70–99)
Glucose-Capillary: 105 mg/dL — ABNORMAL HIGH (ref 70–99)

## 2022-07-09 MED ORDER — POLYETHYLENE GLYCOL 3350 17 G PO PACK: 17.0000 g | PACK | Freq: Every day | ORAL | 0 refills | Status: AC

## 2022-07-09 NOTE — Evaluation (Addendum)
Physical Therapy Evaluation Patient Details Name: Jill Hernandez MRN: [id number] DOB: Jun 23, 1930 Today's Date: 07/09/2022  History of Present Illness  88 y.o. female admitted to [facility] on [facility]/22 forsyncopal episode with breif CPR.  CT head clear and CT abdomen with bil duct dialation, labs with elevated lipase, ECG normal as well as troponins.  Pt wiht significant PMH of HTN, TIA.  Clinical Impression  Pt was able to stand and pivot to the recliner chair with one person assist and RW.  She is likely a bit weaker than baseline report of walking short distances with PT and her SNF.  She will need continued therapy or re-starting therapy if she is not currently active with them at SNF to work on improving independence with gait and functional mobility.   PT to follow acutely for deficits listed below.        Recommendations for follow up therapy are one component of a multi-disciplinary discharge planning process, led by the attending physician.  Recommendations may be updated based on patient status, additional functional criteria and insurance authorization.  Follow Up Recommendations Skilled nursing-short term rehab (<3 hours/day) Can patient physically be transported by private vehicle: No    Assistance Recommended at Discharge Frequent or constant Supervision/Assistance  Patient can return home with the following  A lot of help with walking and/or transfers    Equipment Recommendations None recommended by PT  Recommendations for Other Services       Functional Status Assessment Patient has had a recent decline in their functional status and demonstrates the ability to make significant improvements in function in a reasonable and predictable amount of time.     Precautions / Restrictions Precautions Precautions: Fall Precaution Comments: incontinence of urine upon standing. Restrictions Weight Bearing Restrictions: No      Mobility  Bed Mobility Overal bed mobility: Needs  Assistance Bed Mobility: Supine to Sit     Supine to sit: Min guard, Mod assist     General bed mobility comments: Min assist to help progress up to sitting, mod assist to help weight shfit hips to get to EOB.    Transfers Overall transfer level: Needs assistance Equipment used: Rolling walker (2 wheels) Transfers: Sit to/from Stand, Bed to chair/wheelchair/BSC Sit to Stand: Mod assist, From elevated surface   Step pivot transfers: Mod assist, From elevated surface       General transfer comment: Mod assist to come to standing from elevated bed to RW, shaky pivotal steps to the recliner chair, incontinence of uring while stepping increasing her fall risk without depends.    Ambulation/Gait                  Stairs            Wheelchair Mobility    Modified Rankin (Stroke Patients Only)       Balance Overall balance assessment: Needs assistance Sitting-balance support: Feet supported, Bilateral upper extremity supported Sitting balance-Leahy Scale: Fair Sitting balance - Comments: close supervision EOB.   Standing balance support: Bilateral upper extremity supported Standing balance-Leahy Scale: Poor Standing balance comment: reliant on UE support on RW and assist from therapist                             Pertinent Vitals/Pain Pain Assessment Pain Assessment: Faces Faces Pain Scale: Hurts little more Pain Location: bil knees and ankles, chronic arthritic pain Pain Descriptors / Indicators: Aching Pain Intervention(s): Limited activity within  patient's tolerance, Monitored during session, Repositioned    Home Living Family/patient expects to be discharged to:: Skilled nursing facility                        Prior Function Prior Level of Function : Needs assist       Physical Assist : Mobility (physical) Mobility (physical): Bed mobility;Transfers;Gait   Mobility Comments: Pt reports being active with PT at SNF working on  short distance gait with RW       Hand Dominance   Dominant Hand: Right    Extremity/Trunk Assessment   Upper Extremity Assessment Upper Extremity Assessment: Generalized weakness    Lower Extremity Assessment Lower Extremity Assessment: Generalized weakness    Cervical / Trunk Assessment Cervical / Trunk Assessment: Kyphotic  Communication      Cognition Arousal/Alertness: Awake/alert Behavior During Therapy: WFL for tasks assessed/performed Overall Cognitive Status: Within Functional Limits for tasks assessed                                          General Comments      Exercises General Exercises - Upper Extremity Shoulder Flexion: AROM, Both, 10 reps General Exercises - Lower Extremity Ankle Circles/Pumps: AROM, Both, 10 reps Long Arc Quad: AROM, Both, 10 reps Hip Flexion/Marching: AROM, Both, 10 reps   Assessment/Plan    PT Assessment Patient needs continued PT services  PT Problem List Decreased strength;Decreased activity tolerance;Decreased balance;Decreased mobility;Decreased knowledge of use of DME;Obesity;Pain       PT Treatment Interventions DME instruction;Gait training;Functional mobility training;Therapeutic activities;Therapeutic exercise;Balance training;Patient/family education;Modalities    PT Goals (Current goals can be found in the Care Plan section)  Acute Rehab PT Goals Patient Stated Goal: to go home today (back to SNF) PT Goal Formulation: With patient Time For Goal Achievement: 07/23/22 Potential to Achieve Goals: Good    Frequency Min 2X/week     Co-evaluation               AM-PAC PT "6 Clicks" Mobility  Outcome Measure Help needed turning from your back to your side while in a flat bed without using bedrails?: A Little Help needed moving from lying on your back to sitting on the side of a flat bed without using bedrails?: A Lot Help needed moving to and from a bed to a chair (including a wheelchair)?:  A Lot Help needed standing up from a chair using your arms (e.g., wheelchair or bedside chair)?: A Lot Help needed to walk in hospital room?: Total Help needed climbing 3-5 steps with a railing? : Total 6 Click Score: 11    End of Session Equipment Utilized During Treatment: Gait belt Activity Tolerance: Patient limited by fatigue;Patient limited by pain Patient left: in chair;with call bell/phone within reach;with chair alarm set   PT Visit Diagnosis: Muscle weakness (generalized) (M62.81);Difficulty in walking, not elsewhere classified (R26.2);Pain Pain - Right/Left:  (bil) Pain - part of body: Knee;Ankle and joints of foot (bil knees and ankles)    Time: 9604-5409 PT Time Calculation (min) (ACUTE ONLY): 22 min   Charges:   PT Evaluation $PT Eval Moderate Complexity: 1 Mod          Corinna Capra, PT, DPT  Acute Rehabilitation Secure chat is best for contact #[phone] office

## 2022-07-09 NOTE — Progress Notes (Signed)
Discharge summary has been printed and given to family to give to [place].  Report has been called to [facility].  Patients peripheral iv has been removed.

## 2022-07-09 NOTE — Discharge Summary (Addendum)
Physician Discharge Summary  Jill Hernandez:[id number] DOB: [birth year] DOA: 07/05/2022  PCP: Marinda Elk, MD  Admit date: 07/05/2022 Discharge date: 07/09/2022  Time spent: 40 minutes  Recommendations for Outpatient Follow-up:  Follow outpatient CBC/CMP  Follow with GI outpatient Follow with surgery outpatient Follow MRI w/ without contrast for renal lesions  Follow echo results outpatient SNF level of care recommended at discharge  Discharge Diagnoses:  Principal Problem:   Syncope Active Problems:   Choledocholithiasis   HTN (hypertension)   AKI (acute kidney injury) (HCC)   History of ischemic stroke   Anemia   Cervical myelopathy (HCC)   Abnormal MRI, kidney   Obesity (BMI 30-39.9)   Constipation   HLD (hyperlipidemia)   Elevated lipase   Esophageal stricture   Discharge Condition: stable  Diet recommendation: heart healthy  Filed Weights   07/05/22 2317 07/07/22 1429  Weight: 78.2 kg 78.2 kg    History of present illness:  Jill Hernandez is Jill Hernandez 88 y.o. female with history of CVA in September 2022, hypertension, chronic anemia, hyperlipidemia was brought to the ER after patient had Jill Hernandez brief episode of loss of consciousness.  Patient was complaining of abdominal discomfort for the last 3 days with abdominal distention.  While straining in the bathroom patient lost consciousness and the staff at the healthcare facility had to do CPR briefly.  Patient was brought to the ER.  Patient does not recall incident except going to the bathroom.   She was found to have elevated lipase, MRCP with choledocholithiasis.  S/p ERCP with biliary sphincterotomy, balloon extraction, esophageal dilation for stenosis.  She's not interested in surgery.  Syncopal event thought vagally mediated with straining.  See below for additional details  Hospital Course:  Assessment and Plan: * Syncope Sounds vagally mediated as occurred when she was on commode She notes she was  "probably" straining Staff did do CPR, but sounds like "pt was awake when CPR was administered", doesn't sound like true cardiac arrest Echo 07/2021 with EF 60-65%, grade 1 diastolic dysfunction, no AV stenosis Will repeat echo -> with EF 60-65%, no RWMA, grade 1 diastolic dysfunction, mildly elevated PASP EEG without seizures or epileptiform discharges Continue telemetry PT/OT  Choledocholithiasis Not that impressively symptomatic from pain/discomfort standpoint Lipase elevated ~1500, LFT's wnl MRCP with cholelithasis and choledocholithiasis, pancreas was without mass, fluid collections, or inflammatory changes ERCP with choledocholithiasis, s/p biliary spinchterotomy and balloon extraction, esophageal stenosis (dilated) GI c/s, appreciate recs -> recommending surgery c/s regarding consideration of cholecystectomy -> she's not interested in surgery, follow outpatient with general surgery as needed    HTN (hypertension) Coreg BP elevated, fluctuating, will follow   AKI (acute kidney injury) (HCC) Appears resolved  History of ischemic stroke Aspirin, statin Head CT with sequela of prior infarct, new from August 2022 -> follow with PCP outpatient  Anemia follow  Cervical myelopathy (HCC) noted  Abnormal MRI, kidney Multiple renal lesions, needs f/u MRI with and without gadolinium   Constipation miralax     Procedures: Echo IMPRESSIONS     1. Left ventricular ejection fraction, by estimation, is 60 to 65%. The  left ventricle has normal function. The left ventricle has no regional  wall motion abnormalities. Left ventricular diastolic parameters are  consistent with Grade I diastolic  dysfunction (impaired relaxation).   2. Right ventricular systolic function is normal. The right ventricular  size is normal. There is mildly elevated pulmonary artery systolic  pressure. The estimated right ventricular systolic pressure is  36.1 mmHg.   3. The mitral valve is  degenerative. Trivial mitral valve regurgitation.  No evidence of mitral stenosis. Moderate mitral annular calcification.   4. The aortic valve is tricuspid. There is mild calcification of the  aortic valve. Aortic valve regurgitation is not visualized. Aortic valve  sclerosis/calcification is present, without any evidence of aortic  stenosis.   5. The inferior vena cava is dilated in size with >50% respiratory  variability, suggesting right atrial pressure of 8 mmHg.   Comparison(s): No significant change from prior study.   ERCP, biliary sphincterotomy, balloon extraction, esophageal stenosis s/p dilation   Consultations: GI surgery  Discharge Exam: Vitals:   07/09/22 0832 07/09/22 1149  BP: (!) 159/61 (!) 157/60  Pulse: 68 (!) 55  Resp:  16  Temp: 97.8 F (36.6 C) 98 F (36.7 C)  SpO2: 100% 96%   NAD Eager to discharge Concerned about constipation Discussed with daughter over phone  General: No acute distress. Cardiovascular: RRR Lungs: unlabored Abdomen: Soft, nontender, nondistended Neurological: Alert and oriented 3. Moves all extremities 4. Cranial nerves II through XII grossly intact. Extremities: No clubbing or cyanosis. No edema.  Discharge Instructions   Discharge Instructions     Call MD for:  difficulty breathing, headache or visual disturbances   Complete by: As directed    Call MD for:  extreme fatigue   Complete by: As directed    Call MD for:  hives   Complete by: As directed    Call MD for:  persistant dizziness or light-headedness   Complete by: As directed    Call MD for:  persistant nausea and vomiting   Complete by: As directed    Call MD for:  redness, tenderness, or signs of infection (pain, swelling, redness, odor or green/yellow discharge around incision site)   Complete by: As directed    Call MD for:  severe uncontrolled pain   Complete by: As directed    Call MD for:  temperature >100.4   Complete by: As directed    Diet -  low sodium heart healthy   Complete by: As directed    Discharge instructions   Complete by: As directed    You were seen for complications from gallstones.  You likely had pancreatitis.  You had an ERCP with biliary sphincterotomy and balloon extraction.  You also had esophageal stenosis which was dilated.  You were seen by the general surgeon, but at this time you were not interested in surgery.  Your loss of consciousness was related to straining trying to use the bathroom.  You had renal masses on imaging.  You'll need an MRI with and without contrast outpatient to follow these findings.  Return for new, recurrent, or worsening symptoms.  Please ask your PCP to request records from this hospitalization so they know what was done and what the next steps will be.   Increase activity slowly   Complete by: As directed       Allergies as of 07/09/2022       Reactions   Tape Other (See Comments)   SKIN IS THIN- WILL BRUISE AND TEAR EASILY!!   Sulfa Antibiotics Hives        Medication List     STOP taking these medications    meloxicam 15 MG tablet Commonly known as: MOBIC       TAKE these medications    acetaminophen 650 MG CR tablet Commonly known as: TYLENOL Take 650 mg by mouth in the  morning, at noon, and at bedtime.   ammonium lactate 12 % cream Commonly known as: AMLACTIN apply to both feet daily until resolved   Aspercreme Arthritis Pain 1 % Gel Generic drug: diclofenac Sodium Apply 2-4 g topically 4 (four) times daily as needed (for pain).   aspirin 81 MG chewable tablet Chew 81 mg by mouth daily at 3 pm.   Biofreeze 4 % Gel Generic drug: Menthol (Topical Analgesic) Apply 1 application topically in the morning, at noon, and at bedtime.   carvedilol 12.5 MG tablet Commonly known as: COREG Take 12.5 mg by mouth 2 (two) times daily with Rhodie Cienfuegos meal.   ENSURE PO Take 237 mLs by mouth in the morning and at bedtime.   HEMORRHOIDAL EX CREAM APPLY  TOPICALLY TO EXTERNAL HEMORRHOIDS TWICE DAILY AS NEEDED - IRRITATION   lovastatin 20 MG tablet Commonly known as: MEVACOR Take 20 mg by mouth daily after supper.   NON FORMULARY diet clarification - pureed Heart healthy diet   NON FORMULARY See admin instructions. Supplement: Give 240 med pass by mouth twice daily   pantoprazole 20 MG tablet Commonly known as: PROTONIX Take 20 mg by mouth daily.   pantoprazole 40 MG tablet Commonly known as: PROTONIX Take 1 tablet (40 mg total) by mouth daily.   polyethylene glycol 17 g packet Commonly known as: MIRALAX / GLYCOLAX Take 17 g by mouth daily. Take daily for constipation, you can adjust dose as needed for goal of 1 soft bowel movement daily What changed: additional instructions   sennosides-docusate sodium 8.6-50 MG tablet Commonly known as: SENOKOT-S Take 2 tablets by mouth daily.       Allergies  Allergen Reactions   Tape Other (See Comments)    SKIN IS THIN- WILL BRUISE AND TEAR EASILY!!   Sulfa Antibiotics Hives    Contact information for follow-up providers     Iva Boop, MD Follow up.   Specialty: Gastroenterology Contact information: [address] [place] [address] [phone]         Axel Filler, MD Follow up.   Specialty: General Surgery Contact information: [address] [place] [address] [phone]         Marinda Elk, MD Follow up.   Specialty: Family Medicine Contact information: [phone] [facility] [address] [place] [address] [phone]              Contact information for after-discharge care     Destination     [facility] Preferred SNF .   Service: Skilled Nursing Contact information: [address] [place] [address] [phone]                      The results of significant diagnostics from this hospitalization (including imaging, microbiology, ancillary and  laboratory) are listed below for reference.    Significant Diagnostic Studies: ECHOCARDIOGRAM COMPLETE  Result Date: 07/09/2022    ECHOCARDIOGRAM REPORT   Patient Name:   MYRICLE MELLING Date of Exam: 07/09/2022 Medical Rec #:  [id number]        Height:       66.0 in Accession #:    [id number]       Weight:       172.4 lb Date of Birth:  1930/10/13        BSA:          1.877 m Patient Age:    49 years  BP:           159/61 mmHg Patient Gender: F                HR:           62 bpm. Exam Location:  Inpatient Procedure: 2D Echo Indications:    syncope  History:        Patient has prior history of Echocardiogram examinations, most                 recent 07/28/2021. Risk Factors:Hypertension and Dyslipidemia.  Sonographer:    Johny Chess RDCS Referring Phys: [place]  1. Left ventricular ejection fraction, by estimation, is 60 to 65%. The left ventricle has normal function. The left ventricle has no regional wall motion abnormalities. Left ventricular diastolic parameters are consistent with Grade I diastolic dysfunction (impaired relaxation).  2. Right ventricular systolic function is normal. The right ventricular size is normal. There is mildly elevated pulmonary artery systolic pressure. The estimated right ventricular systolic pressure is 99991111 mmHg.  3. The mitral valve is degenerative. Trivial mitral valve regurgitation. No evidence of mitral stenosis. Moderate mitral annular calcification.  4. The aortic valve is tricuspid. There is mild calcification of the aortic valve. Aortic valve regurgitation is not visualized. Aortic valve sclerosis/calcification is present, without any evidence of aortic stenosis.  5. The inferior vena cava is dilated in size with >50% respiratory variability, suggesting right atrial pressure of 8 mmHg. Comparison(s): No significant change from prior study. Conclusion(s)/Recommendation(s): No intracardiac source of embolism detected on this  transthoracic study. Consider Nalaysia Manganiello transesophageal echocardiogram to exclude cardiac source of embolism if clinically indicated. FINDINGS  Left Ventricle: Left ventricular ejection fraction, by estimation, is 60 to 65%. The left ventricle has normal function. The left ventricle has no regional wall motion abnormalities. The left ventricular internal cavity size was normal in size. There is  no left ventricular hypertrophy. Left ventricular diastolic parameters are consistent with Grade I diastolic dysfunction (impaired relaxation). Right Ventricle: The right ventricular size is normal. No increase in right ventricular wall thickness. Right ventricular systolic function is normal. There is mildly elevated pulmonary artery systolic pressure. The tricuspid regurgitant velocity is 2.65  m/s, and with an assumed right atrial pressure of 8 mmHg, the estimated right ventricular systolic pressure is 99991111 mmHg. Left Atrium: Left atrial size was normal in size. Right Atrium: Right atrial size was normal in size. Pericardium: There is no evidence of pericardial effusion. Presence of epicardial fat layer. Mitral Valve: The mitral valve is degenerative in appearance. Moderate mitral annular calcification. Trivial mitral valve regurgitation. No evidence of mitral valve stenosis. MV peak gradient, 4.7 mmHg. The mean mitral valve gradient is 2.0 mmHg. Tricuspid Valve: The tricuspid valve is grossly normal. Tricuspid valve regurgitation is mild . No evidence of tricuspid stenosis. Aortic Valve: The aortic valve is tricuspid. There is mild calcification of the aortic valve. Aortic valve regurgitation is not visualized. Aortic valve sclerosis/calcification is present, without any evidence of aortic stenosis. Pulmonic Valve: The pulmonic valve was grossly normal. Pulmonic valve regurgitation is not visualized. No evidence of pulmonic stenosis. Aorta: The aortic root is normal in size and structure. Venous: The inferior vena cava is  dilated in size with greater than 50% respiratory variability, suggesting right atrial pressure of 8 mmHg. IAS/Shunts: The atrial septum is grossly normal.  LEFT VENTRICLE PLAX 2D LVIDd:         3.80 cm   Diastology LVIDs:  2.40 cm   LV e' medial:    6.85 cm/s LV PW:         0.90 cm   LV E/e' medial:  12.4 LV IVS:        1.00 cm   LV e' lateral:   6.53 cm/s LVOT diam:     1.80 cm   LV E/e' lateral: 13.0 LV SV:         50 LV SV Index:   26 LVOT Area:     2.54 cm  RIGHT VENTRICLE            IVC RV S prime:     8.27 cm/s  IVC diam: 2.10 cm LEFT ATRIUM           Index        RIGHT ATRIUM           Index LA diam:      4.20 cm 2.24 cm/m   RA Area:     13.50 cm LA Vol (A2C): 45.2 ml 24.07 ml/m  RA Volume:   32.60 ml  17.36 ml/m  AORTIC VALVE LVOT Vmax:   85.90 cm/s LVOT Vmean:  54.800 cm/s LVOT VTI:    0.195 m  AORTA Ao Root diam: 3.00 cm MITRAL VALVE               TRICUSPID VALVE MV Area (PHT): 3.00 cm    TR Peak grad:   28.1 mmHg MV Area VTI:   1.42 cm    TR Vmax:        265.00 cm/s MV Peak grad:  4.7 mmHg MV Mean grad:  2.0 mmHg    SHUNTS MV Vmax:       1.08 m/s    Systemic VTI:  0.20 m MV Vmean:      61.5 cm/s   Systemic Diam: 1.80 cm MV Decel Time: 253 msec MV E velocity: 84.60 cm/s MV Jhair Witherington velocity: 89.30 cm/s MV E/Analaya Hoey ratio:  0.95 Eleonore Chiquito MD Electronically signed by Eleonore Chiquito MD Signature Date/Time: 07/09/2022/11:35:56 AM    Final    DG ERCP  Result Date: 07/07/2022 CLINICAL DATA:  88 year old female with history of choledocholithiasis. EXAM: ERCP TECHNIQUE: Multiple spot images obtained with the fluoroscopic device and submitted for interpretation post-procedure. FLUOROSCOPY TIME:  40.333 mGy COMPARISON:  MRCP from 07/06/2022 FINDINGS: Duodenal scope is positioned in the second portion of the duodenum. Retrograde cholangiogram was performed demonstrating moderate intra and extrahepatic biliary ductal dilation. Suggestion of rounded filling defect in the distal common bile duct compatible with  choledocholithiasis. Balloon sweep is performed. IMPRESSION: At least moderately obstructive distal common bile duct choledocholithiasis. Balloon sweep was performed. These images were submitted for radiologic interpretation only. Please see the procedural report for the full procedural details, amount of contrast, and the fluoroscopy time utilized. Ruthann Cancer, MD [facility] Electronically Signed   By: Ruthann Cancer M.D.   On: 07/07/2022 16:06   EEG adult  Result Date: 07/06/2022 Lora Havens, MD     07/06/2022  8:29 AM Patient Name: RUARI APPELL MRN: [id number] Epilepsy Attending: Lora Havens Referring Physician/Provider: Rise Patience, MD Date: 07/05/2022 Duration: 21.40 mins Patient history: 88 year old female with syncope.  EEG evaluate for seizure. Level of alertness: Awake AEDs during EEG study: None Technical aspects: This EEG study was done with scalp electrodes positioned according to the 10-20 International system of electrode placement. Electrical activity was reviewed with band pass filter  of 1-70Hz , sensitivity of 7 uV/mm, display speed of 65mm/sec with Baldo Hufnagle 60Hz  notched filter applied as appropriate. EEG data were recorded continuously and digitally stored.  Video monitoring was available and reviewed as appropriate. Description: The posterior dominant rhythm consists of 8 Hz activity of moderate voltage (25-35 uV) seen predominantly in posterior head regions, symmetric and reactive to eye opening and eye closing. Hyperventilation and photic stimulation were not performed.   IMPRESSION: This study is within normal limits. No seizures or epileptiform discharges were seen throughout the recording. Marianne Golightly normal interictal EEG does not exclude nor support the diagnosis of epilepsy. Lora Havens   MR ABDOMEN MRCP WO CONTRAST  Result Date: 07/06/2022 CLINICAL DATA:  88 year old female with history of abdominal discomfort.  Evaluate for cholelithiasis. EXAM: MRI ABDOMEN WITHOUT CONTRAST  (INCLUDING MRCP) TECHNIQUE: Multiplanar multisequence MR imaging of the abdomen was performed. Heavily T2-weighted images of the biliary and pancreatic ducts were obtained, and three-dimensional MRCP images were rendered by post processing. COMPARISON:  No prior abdominal MRI. CT of the abdomen and pelvis 07/05/2022. FINDINGS: Comment: Portions of today's examination are considerably limited by patient respiratory motion. Additionally, today's study is limited for detection and characterization of visceral and/or vascular lesions by lack of IV gadolinium. Lower chest: Unremarkable. Hepatobiliary: No suspicious cystic or solid hepatic lesions are confidently identified on today's noncontrast examination. There are filling defects in the gallbladder, compatible with gallstones, measuring up to 1.2 cm. Gallbladder is not distended. Gallbladder wall thickness is normal. No pericholecystic fluid or surrounding inflammatory changes. Mild intra and extrahepatic biliary ductal dilatation. MRCP images demonstrate filling defects in the common bile duct measuring up to 7 mm (image 51 of series 18), and in the distal cystic duct measuring up to 4 mm (image 51 of series 18). Pancreas: No definite pancreatic mass or peripancreatic fluid collections or inflammatory changes noted on today's noncontrast examination. No pancreatic ductal dilatation noted on MRCP images. Spleen:  Unremarkable. Adrenals/Urinary Tract: Multiple renal lesions bilaterally which are T1 hypointense and variable signal intensity on T2 weighted images ranging from T2 hyper to hypointense, incompletely characterized on today's noncontrast examination, but statistically likely to represent Rishabh Rinkenberger combination of simple and proteinaceous/hemorrhagic cysts. No hydroureteronephrosis in the visualized portions of the abdomen. Bilateral adrenal glands are normal in appearance. Stomach/Bowel: Numerous  colonic diverticulae are noted. Visualized portions are otherwise unremarkable. Vascular/Lymphatic: No aneurysm identified in the visualized abdominal vasculature. No lymphadenopathy noted in the abdomen. Other: No significant volume of ascites noted in the visualized portions of the peritoneal cavity. Musculoskeletal: No aggressive appearing osseous lesions are noted in the visualized portions of the skeleton. IMPRESSION: 1. Study is positive for cholelithiasis as well as choledocholithiasis. There are stones not only in the gallbladder, but in the distal cystic duct and in the common bile duct. This is associated with mild intra and extrahepatic biliary ductal dilatation. No overt imaging findings of acute cholecystitis are noted at this time. 2. Colonic diverticulosis without evidence of acute diverticulitis at this time. 3. Multiple renal lesions bilaterally, incompletely characterized on today's noncontrast examination, but statistically likely Easter Kennebrew combination of proteinaceous and hemorrhagic cysts. Follow-up abdominal MRI with and without IV gadolinium could be considered if of clinical concern. Electronically Signed   By: Vinnie Langton M.D.   On: 07/06/2022 07:07   CT HEAD WO CONTRAST (5MM)  Result Date: 07/05/2022 CLINICAL DATA:  Mental status change, unknown cause Unwitnessed syncopal event in the bathroom. EXAM: CT HEAD WITHOUT CONTRAST TECHNIQUE: Contiguous axial images were  obtained from the base of the skull through the vertex without intravenous contrast. RADIATION DOSE REDUCTION: This exam was performed according to the departmental dose-optimization program which includes automated exposure control, adjustment of the mA and/or kV according to patient size and/or use of iterative reconstruction technique. COMPARISON:  Head CT and brain MRI 07/27/2021 FINDINGS: Brain: No acute intracranial hemorrhage. There is encephalomalacia in the right occipital lobe that is new from prior exam. No evidence of  acute ischemia. There is no hydrocephalus, mass effect, or midline shift. No extra-axial or subdural collection. Normal for age atrophy with mild periventricular chronic small vessel ischemia. Vascular: Atherosclerosis of skullbase vasculature without hyperdense vessel or abnormal calcification. Skull: No acute findings. Sinuses/Orbits: Chronic opacification of right side of sphenoid sinus, increased from prior exam. No mastoid effusion. Bilateral cataract resection. Other: None. IMPRESSION: 1. No acute intracranial abnormality. 2. Encephalomalacia in the right occipital lobe. This likely represents sequela of prior infarct, new from August of 2022. 3. Age related atrophy and mild chronic small vessel ischemia. Electronically Signed   By: Keith Rake M.D.   On: 07/05/2022 23:55   CT ABDOMEN PELVIS W CONTRAST  Result Date: 07/05/2022 CLINICAL DATA:  Pancreatitis, acute, severe.  Abdominal distention EXAM: CT ABDOMEN AND PELVIS WITH CONTRAST TECHNIQUE: Multidetector CT imaging of the abdomen and pelvis was performed using the standard protocol following bolus administration of intravenous contrast. RADIATION DOSE REDUCTION: This exam was performed according to the departmental dose-optimization program which includes automated exposure control, adjustment of the mA and/or kV according to patient size and/or use of iterative reconstruction technique. CONTRAST:  70mL OMNIPAQUE IOHEXOL 350 MG/ML SOLN COMPARISON:  None Available. FINDINGS: Lower chest: No acute abnormality.  See chest CT report Hepatobiliary: Small gallstones within the gallbladder. Mild intrahepatic and extrahepatic biliary ductal dilatation. Common bile duct measures up to 10 mm. This may be related to patient's age. No visible ductal stones. Pancreas: No focal abnormality or ductal dilatation. No surrounding inflammation to suggest acute pancreatitis Spleen: No focal abnormality.  Normal size. Adrenals/Urinary Tract: Small bilateral renal  cysts. No follow-up imaging recommended. Adrenal glands unremarkable. No stones or hydronephrosis. Urinary bladder unremarkable. Stomach/Bowel: Diffuse colonic diverticulosis. No active diverticulitis. Stomach and small bowel unremarkable. No bowel obstruction. Vascular/Lymphatic: Aortic atherosclerosis. No evidence of aneurysm or adenopathy. Reproductive: Prior hysterectomy.  No adnexal masses. Other: No free fluid or free air. Musculoskeletal: No acute bony abnormality. IMPRESSION: No CT evidence of acute pancreatitis. Diffuse colonic diverticulosis.  No active diverticulitis. Slight intrahepatic and extrahepatic biliary ductal dilatation, likely related to patient's age. Recommend correlation with LFTs. Cholelithiasis. Aortic atherosclerosis. Electronically Signed   By: Rolm Baptise M.D.   On: 07/05/2022 18:27   CT Angio Chest PE W and/or Wo Contrast  Result Date: 07/05/2022 CLINICAL DATA:  Pulmonary embolism (PE) suspected, positive D-dimer EXAM: CT ANGIOGRAPHY CHEST WITH CONTRAST TECHNIQUE: Multidetector CT imaging of the chest was performed using the standard protocol during bolus administration of intravenous contrast. Multiplanar CT image reconstructions and MIPs were obtained to evaluate the vascular anatomy. RADIATION DOSE REDUCTION: This exam was performed according to the departmental dose-optimization program which includes automated exposure control, adjustment of the mA and/or kV according to patient size and/or use of iterative reconstruction technique. CONTRAST:  107mL OMNIPAQUE IOHEXOL 350 MG/ML SOLN COMPARISON:  None Available. FINDINGS: Cardiovascular: No filling defects in the pulmonary arteries to suggest pulmonary emboli. Cardiomegaly. Coronary artery and aortic calcifications. No evidence of aortic aneurysm. Mediastinum/Nodes: No mediastinal, hilar, or axillary adenopathy. Trachea and  esophagus are unremarkable. Thyroid unremarkable. Lungs/Pleura: No confluent airspace opacities or  effusions. Upper Abdomen: No acute findings Musculoskeletal: Chest wall soft tissues are unremarkable. No acute bony abnormality. Review of the MIP images confirms the above findings. IMPRESSION: No evidence of pulmonary embolus. Cardiomegaly, coronary artery disease. No acute cardiopulmonary disease. Aortic Atherosclerosis (ICD10-I70.0). Electronically Signed   By: Charlett Nose M.D.   On: 07/05/2022 18:24   DG Chest Port 1 View  Result Date: 07/05/2022 CLINICAL DATA:  Syncopal episode EXAM: PORTABLE CHEST 1 VIEW COMPARISON:  07/23/2021 FINDINGS: Heart size upper limits of normal. Chronic aortic atherosclerosis. The lungs are clear. The vascularity is normal. No effusions. IMPRESSION: No active disease. Electronically Signed   By: Paulina Fusi M.D.   On: 07/05/2022 15:56    Microbiology: No results found for this or any previous visit (from the past 240 hour(s)).   Labs: Basic Metabolic Panel: Recent Labs  Lab 07/05/22 1538 07/06/22 0254 07/07/22 0300 07/08/22 0216  NA 130* 133* 134* 134*  K 5.2* 4.2 4.2 3.9  CL 95* 98 101 100  CO2 25 25 27 25   GLUCOSE 149* 97 94 164*  BUN 36* 25* 17 14  CREATININE 1.25* 0.96 1.03* 0.98  CALCIUM 9.4 9.3 9.0 8.7*  MG  --   --  2.1 1.9  PHOS  --   --  2.6 3.6   Liver Function Tests: Recent Labs  Lab 07/05/22 1538 07/07/22 0300 07/08/22 0216  AST 37 17 58*  ALT 22 16 43  ALKPHOS 66 63 139*  BILITOT 0.6 0.9 1.1  PROT 6.8 6.3* 6.7  ALBUMIN 3.2* 3.0* 3.1*   Recent Labs  Lab 07/05/22 1538  LIPASE 1,568*   No results for input(s): "AMMONIA" in the last 168 hours. CBC: Recent Labs  Lab 07/05/22 1538 07/06/22 0254 07/07/22 0300 07/08/22 0216  WBC 9.9 11.2* 7.7 14.1*  NEUTROABS 7.7  --  5.0 12.7*  HGB 11.6* 12.0 11.4* 12.1  HCT 36.6 36.9 34.9* 36.5  MCV 100.0 97.9 99.1 97.6  PLT 243 232 215 202   Cardiac Enzymes: No results for input(s): "CKTOTAL", "CKMB", "CKMBINDEX", "TROPONINI" in the last 168 hours. BNP: BNP (last 3  results) Recent Labs    07/05/22 1538  BNP 71.4    ProBNP (last 3 results) No results for input(s): "PROBNP" in the last 8760 hours.  CBG: Recent Labs  Lab 07/08/22 1250 07/08/22 1715 07/08/22 2152 07/09/22 0615 07/09/22 1146  GLUCAP 130* 138* 139* 95 105*       Signed:  07/11/22 MD.  [facility] 07/09/2022, 12:10 PM

## 2022-07-09 NOTE — Progress Notes (Signed)
PT Cancellation Note  Patient Details Name: MYKEL MOHL MRN: [id number] DOB: July 05, 1930   Cancelled Treatment:    Reason Eval/Treat Not Completed: Patient at procedure or test/unavailable.  Pt off floor for testing.  PT will check back when she comes back to the floor.   Thanks,  Corinna Capra, PT, DPT  Acute Rehabilitation Secure chat is best for contact #[phone] office      Dimas Aguas 07/09/2022, 10:30 AM

## 2022-07-09 NOTE — Progress Notes (Signed)
Central tele called to report patient HR was down to 38 as lowest. Patient is sleeping on her bed, appears comfortable not on distress. Current HR on the monitor is 45-57. On call MD made aware also. Will continue to monitor.

## 2022-07-09 NOTE — Progress Notes (Signed)
  Echocardiogram 2D Echocardiogram has been performed.  Jill Hernandez 07/09/2022, 10:50 AM

## 2022-07-09 NOTE — TOC Transition Note (Signed)
Transition of Care [facility]) - CM/SW Discharge Note   Patient Details  Name: Jill Hernandez MRN: [id number] Date of Birth: 1930/07/10  Transition of [facility] CM/SW Contact:  Deatra Robinson, [place] Phone Number: 07/09/2022, 1:27 PM   Clinical Narrative:  Pt for dc back to [facility] where she is a LTC resident. PT worked with pt this admission and recommending continued PT at [facility]. Spoke to [name] with [facility] who confirmed they are prepared to admit pt to room 126B. [name] aware of PT recommendation. Pt's dtr aware of dc and will provide transport. RN provided with number for report. SW signing off at dc.   Dellie Burns, MSW, LCSW [phone] (coverage)      Final next level of care: Skilled Nursing Facility Barriers to Discharge: No Barriers Identified   Patient Goals and CMS Choice Patient states their goals for this hospitalization and ongoing recovery are:: patient off unit for procedure CMS Medicare.gov Compare Post Acute Care list provided to:: Patient Choice offered to / list presented to : Adult Children  Discharge Placement              Patient chooses bed at: [facility] Patient to be transferred to facility by: family Name of family member notified: [name] Patient and family notified of of transfer: 07/09/22  Discharge Plan and Services     Post Acute Care Choice: Skilled Nursing Facility                               Social Determinants of Health (SDOH) Interventions     Readmission Risk Interventions     No data to display

## 2022-07-11 ENCOUNTER — Other Ambulatory Visit: Payer: Self-pay | Admitting: *Deleted

## 2022-07-11 NOTE — Patient Outreach (Signed)
Jill Hernandez resides in [facility]. Screening for care coordination/care management needs as benefit of insurance plan.   Facility site visit to [facility]. Collaboration with Jill Hernandez, SNF SW and Jill Hernandez, Sales promotion account executive. Jill Hernandez is a long term resident of [place]. Recently admitted from hospital on 07/09/22. Receiving skilled therapy. She is not ambulating at this time. Goal is to ambulate 50' ft. Transition plan is to return to LTC post skilled care.   Will continue to follow.    Raiford Noble, MSN, RN,BSN [facility] Post Acute Care Coordinator [phone] [facility]) [phone]  (Toll free office)

## 2022-07-12 DIAGNOSIS — I639 Cerebral infarction, unspecified: Secondary | ICD-10-CM | POA: Diagnosis not present

## 2022-07-12 DIAGNOSIS — R1310 Dysphagia, unspecified: Secondary | ICD-10-CM | POA: Diagnosis not present

## 2022-07-12 DIAGNOSIS — G459 Transient cerebral ischemic attack, unspecified: Secondary | ICD-10-CM | POA: Diagnosis not present

## 2022-07-12 DIAGNOSIS — Z09 Encounter for follow-up examination after completed treatment for conditions other than malignant neoplasm: Secondary | ICD-10-CM | POA: Diagnosis not present

## 2022-07-12 DIAGNOSIS — E785 Hyperlipidemia, unspecified: Secondary | ICD-10-CM | POA: Diagnosis not present

## 2022-07-12 DIAGNOSIS — I10 Essential (primary) hypertension: Secondary | ICD-10-CM | POA: Diagnosis not present

## 2022-07-12 DIAGNOSIS — R55 Syncope and collapse: Secondary | ICD-10-CM | POA: Diagnosis not present

## 2022-07-12 DIAGNOSIS — D649 Anemia, unspecified: Secondary | ICD-10-CM | POA: Diagnosis not present

## 2022-07-13 ENCOUNTER — Telehealth: Payer: Self-pay | Admitting: Internal Medicine

## 2022-07-13 DIAGNOSIS — E1151 Type 2 diabetes mellitus with diabetic peripheral angiopathy without gangrene: Secondary | ICD-10-CM | POA: Diagnosis not present

## 2022-07-13 DIAGNOSIS — I10 Essential (primary) hypertension: Secondary | ICD-10-CM | POA: Diagnosis not present

## 2022-07-13 DIAGNOSIS — K222 Esophageal obstruction: Secondary | ICD-10-CM | POA: Diagnosis not present

## 2022-07-13 DIAGNOSIS — K8050 Calculus of bile duct without cholangitis or cholecystitis without obstruction: Secondary | ICD-10-CM | POA: Diagnosis not present

## 2022-07-13 NOTE — Telephone Encounter (Signed)
Jill Hernandez from [facility] stated that they are requesting to make a follow up appt after pt was recently discharged from Hospital:  Pt was scheduled with Quentin Mulling  PA 08/11/2022 at 3:00 PM   [name] made aware. [name] verbalized understanding with all questions answered.

## 2022-07-13 NOTE — Telephone Encounter (Signed)
Nursing home called, wants a hopital follow up with Dr Leone Payor. Requesting call back on [phone].

## 2022-07-14 DIAGNOSIS — D649 Anemia, unspecified: Secondary | ICD-10-CM | POA: Diagnosis not present

## 2022-07-14 DIAGNOSIS — R1310 Dysphagia, unspecified: Secondary | ICD-10-CM | POA: Diagnosis not present

## 2022-07-14 DIAGNOSIS — I639 Cerebral infarction, unspecified: Secondary | ICD-10-CM | POA: Diagnosis not present

## 2022-07-14 DIAGNOSIS — G459 Transient cerebral ischemic attack, unspecified: Secondary | ICD-10-CM | POA: Diagnosis not present

## 2022-07-14 DIAGNOSIS — E785 Hyperlipidemia, unspecified: Secondary | ICD-10-CM | POA: Diagnosis not present

## 2022-07-14 DIAGNOSIS — I10 Essential (primary) hypertension: Secondary | ICD-10-CM | POA: Diagnosis not present

## 2022-07-14 DIAGNOSIS — R55 Syncope and collapse: Secondary | ICD-10-CM | POA: Diagnosis not present

## 2022-07-18 ENCOUNTER — Other Ambulatory Visit: Payer: Self-pay | Admitting: *Deleted

## 2022-07-18 DIAGNOSIS — K5900 Constipation, unspecified: Secondary | ICD-10-CM | POA: Diagnosis not present

## 2022-07-18 DIAGNOSIS — R55 Syncope and collapse: Secondary | ICD-10-CM | POA: Diagnosis not present

## 2022-07-18 NOTE — Patient Outreach (Signed)
[facility] Post- Acute Care Coordinator follow up.   Jill Hernandez resides in [facility]. Met with Ferdie Ping who reports Jill Hernandez will likely transition back to restorative care soon. Jill Hernandez is a longterm resident of [facility].  Marthenia Rolling, MSN, RN,BSN [place] Acute Care Coordinator [phone] [facility]) [phone]  (Toll free office)

## 2022-07-19 DIAGNOSIS — G459 Transient cerebral ischemic attack, unspecified: Secondary | ICD-10-CM | POA: Diagnosis not present

## 2022-07-19 DIAGNOSIS — D649 Anemia, unspecified: Secondary | ICD-10-CM | POA: Diagnosis not present

## 2022-07-19 DIAGNOSIS — R1310 Dysphagia, unspecified: Secondary | ICD-10-CM | POA: Diagnosis not present

## 2022-07-19 DIAGNOSIS — I10 Essential (primary) hypertension: Secondary | ICD-10-CM | POA: Diagnosis not present

## 2022-07-19 DIAGNOSIS — R55 Syncope and collapse: Secondary | ICD-10-CM | POA: Diagnosis not present

## 2022-07-19 DIAGNOSIS — I639 Cerebral infarction, unspecified: Secondary | ICD-10-CM | POA: Diagnosis not present

## 2022-07-19 DIAGNOSIS — E785 Hyperlipidemia, unspecified: Secondary | ICD-10-CM | POA: Diagnosis not present

## 2022-07-26 DIAGNOSIS — D649 Anemia, unspecified: Secondary | ICD-10-CM | POA: Diagnosis not present

## 2022-07-26 DIAGNOSIS — G459 Transient cerebral ischemic attack, unspecified: Secondary | ICD-10-CM | POA: Diagnosis not present

## 2022-07-26 DIAGNOSIS — I639 Cerebral infarction, unspecified: Secondary | ICD-10-CM | POA: Diagnosis not present

## 2022-07-26 DIAGNOSIS — R55 Syncope and collapse: Secondary | ICD-10-CM | POA: Diagnosis not present

## 2022-07-26 DIAGNOSIS — I10 Essential (primary) hypertension: Secondary | ICD-10-CM | POA: Diagnosis not present

## 2022-07-26 DIAGNOSIS — R1310 Dysphagia, unspecified: Secondary | ICD-10-CM | POA: Diagnosis not present

## 2022-07-26 DIAGNOSIS — E785 Hyperlipidemia, unspecified: Secondary | ICD-10-CM | POA: Diagnosis not present

## 2022-07-28 DIAGNOSIS — D649 Anemia, unspecified: Secondary | ICD-10-CM | POA: Diagnosis not present

## 2022-07-28 DIAGNOSIS — I639 Cerebral infarction, unspecified: Secondary | ICD-10-CM | POA: Diagnosis not present

## 2022-07-28 DIAGNOSIS — R55 Syncope and collapse: Secondary | ICD-10-CM | POA: Diagnosis not present

## 2022-07-28 DIAGNOSIS — R1310 Dysphagia, unspecified: Secondary | ICD-10-CM | POA: Diagnosis not present

## 2022-07-28 DIAGNOSIS — G459 Transient cerebral ischemic attack, unspecified: Secondary | ICD-10-CM | POA: Diagnosis not present

## 2022-07-28 DIAGNOSIS — E785 Hyperlipidemia, unspecified: Secondary | ICD-10-CM | POA: Diagnosis not present

## 2022-07-28 DIAGNOSIS — I10 Essential (primary) hypertension: Secondary | ICD-10-CM | POA: Diagnosis not present

## 2022-08-10 NOTE — Progress Notes (Unsigned)
08/11/2022 Jill Hernandez [id number] 10-19-1930  Referring provider: Marinda Elk, MD Primary GI doctor: Dr. Leonides Schanz  ASSESSMENT AND PLAN:   Gastroesophageal reflux disease, unspecified whether esophagitis present Lifestyle changes discussed, avoid NSAIDS, ETOH Increase pantoprazole from 20 to 40 mg  Choledocholithiasis s/p ERCP with cholelithiasis Doing well s/p ERCP, still has stones but declined cholecystectomy which is reasonable with age.  Check liver functions at [facility] ER precautions discussed Can consider ursodiol if LFTs elevated and will discuss with Dr. Leonides Schanz.   Chronic idiopathic constipation - Increase fiber/ water intake, decrease caffeine, increase activity level. - continue miralax daily - no obstructive symptoms at this time.   Patient Care Team: Marinda Elk, MD as PCP - General (Family Medicine) Medina-Vargas, Margit Banda, NP as Nurse Practitioner (Internal Medicine)  HISTORY OF PRESENT ILLNESS: 88 y.o. female with a past medical history of DM, CVA, HTN, HLD, OA, GERD, diverticulosis , cholelithiasis / choledocholithiasisand others listed below, presents for hospital follow up.   Patient was in the hospital from 07/05/22 to 07/09/22, was in the hospital for cholelithiasis/choledocholithiasis possible biliary pancreatitis. 07/07/2022 ERCP with choledocholithiasis status post sphincterectomy and balloon sweeps.   Patient with no evidence of post ERCP pancreatitis Patient was recommended to have discussion with general surgery about cholecystectomy, due to age and comorbidities patient was not interested. Patient was on pantoprazole 20 mg twice a day and was on twice a day, having worse GERd and taking tums/mints.  Daughter [name] is here.  Patient states she is having BM daily.  She is doing well on miralax once a day, will get enema occ.  States she feels she has a lot of gas, and can not pass gas. She is taking 8-10 tums a day.   Denies AB  pain, nausea, vomiting.    She  reports that she quit smoking about 59 years ago. Her smoking use included cigarettes. She has never used smokeless tobacco. She reports that she does not drink alcohol and does not use drugs.  Current Medications:    Current Outpatient Medications (Cardiovascular):    lovastatin (MEVACOR) 20 MG tablet, Take 20 mg by mouth daily after supper.   carvedilol (COREG) 12.5 MG tablet, Take 12.5 mg by mouth 2 (two) times daily with a meal.   Current Outpatient Medications (Analgesics):    acetaminophen (TYLENOL) 650 MG CR tablet, Take 650 mg by mouth in the morning, at noon, and at bedtime.   aspirin 81 MG chewable tablet, Chew 81 mg by mouth daily at 3 pm.    Current Outpatient Medications (Other):    NON FORMULARY, diet clarification - pureed Heart healthy diet   NON FORMULARY, See admin instructions. Supplement: Give 240 med pass by mouth twice daily   Nutritional Supplements (ENSURE PO), Take 237 mLs by mouth in the morning and at bedtime.   pantoprazole (PROTONIX) 20 MG tablet, Take 20 mg by mouth daily.   polyethylene glycol (MIRALAX / GLYCOLAX) 17 g packet, Take 17 g by mouth daily. Take daily for constipation, you can adjust dose as needed for goal of 1 soft bowel movement daily   Pramox-PE-Glycerin-Petrolatum (HEMORRHOIDAL EX), CREAM APPLY TOPICALLY TO EXTERNAL HEMORRHOIDS TWICE DAILY AS NEEDED - IRRITATION   sennosides-docusate sodium (SENOKOT-S) 8.6-50 MG tablet, Take 2 tablets by mouth daily.   ammonium lactate (AMLACTIN) 12 % cream, apply to both feet daily until resolved   ASPERCREME ARTHRITIS PAIN 1 % GEL, Apply 2-4 g topically 4 (four) times daily as needed (  for pain).   BIOFREEZE 4 % GEL, Apply 1 application topically in the morning, at noon, and at bedtime.   pantoprazole (PROTONIX) 40 MG tablet, Take 1 tablet (40 mg total) by mouth daily. (Patient not taking: Reported on 07/05/2022)  Medical History:  Past Medical History:  Diagnosis Date    GERD (gastroesophageal reflux disease)    Hyperlipemia    Hypertension    TIA (transient ischemic attack)    Allergies:  Allergies  Allergen Reactions   Tape Other (See Comments)    SKIN IS THIN- WILL BRUISE AND TEAR EASILY!!   Sulfa Antibiotics Hives     Surgical History:  She  has a past surgical history that includes Abdominal hysterectomy; Tonsillectomy; ERCP (N/A, 07/07/2022); Esophagogastroduodenoscopy (egd) with propofol (N/A, 07/07/2022); Balloon dilation (N/A, 07/07/2022); sphincterotomy (07/07/2022); and removal of stones (07/07/2022). Family History:  Her family history includes Hyperlipidemia in her father; Hypertension in her father and mother.  REVIEW OF SYSTEMS  : All other systems reviewed and negative except where noted in the History of Present Illness.  PHYSICAL EXAM: BP 122/64   Pulse 73   Ht 5\' 6"  (1.676 m)   BMI 27.83 kg/m  General:  obese female in wheel chair, no distress, HOH Head:   Normocephalic and atraumatic. Eyes:  sclerae anicteric,conjunctive pink  Heart:   regular rate and rhythm Pulm:  Clear anteriorly; no wheezing Abdomen:   Soft, Obese AB, Sluggish bowel sounds. mild tenderness in the RUQ. Without guarding and Without rebound, without murphy's, Rectal: Not evaluated Extremities:  Without edema. Msk:  Peripheral pulses intact.  Neurologic:    No focal deficits.  Psychiatric:  Cooperative. Normal mood and affect.    , PA-C 3:59 PM

## 2022-08-11 ENCOUNTER — Ambulatory Visit (INDEPENDENT_AMBULATORY_CARE_PROVIDER_SITE_OTHER): Payer: Medicare Other | Admitting: Physician Assistant

## 2022-08-11 ENCOUNTER — Encounter: Payer: Self-pay | Admitting: Physician Assistant

## 2022-08-11 VITALS — BP 122/64 | HR 73 | Ht 66.0 in

## 2022-08-11 DIAGNOSIS — K8020 Calculus of gallbladder without cholecystitis without obstruction: Secondary | ICD-10-CM

## 2022-08-11 DIAGNOSIS — K219 Gastro-esophageal reflux disease without esophagitis: Secondary | ICD-10-CM | POA: Diagnosis not present

## 2022-08-11 DIAGNOSIS — K8050 Calculus of bile duct without cholangitis or cholecystitis without obstruction: Secondary | ICD-10-CM | POA: Diagnosis not present

## 2022-08-11 DIAGNOSIS — K5904 Chronic idiopathic constipation: Secondary | ICD-10-CM

## 2022-08-11 NOTE — Patient Instructions (Signed)
Increase pantoprazole ot 40 mg once daily Can take pepcid AS needed 20 mg as needed Can do gas x as needed before food  Depending on labs, may consider adding on medication called ursodiol to dissolve stones.   Miralax is an osmotic laxative.  It only brings more water into the stool.  This is safe to take daily.  Can take up to 17 gram of miralax twice a day.  Mix with juice or coffee.  Start 1 capful at night for 3-4 days and reassess your response in 3-4 days.  You can increase and decrease the dose based on your response.  Remember, it can take up to 3-4 days to take effect OR for the effects to wear off.   I often pair this with benefiber in the morning to help assure the stool is not too loose.    - Drink at least 64-80 ounces of water/liquid per day. - Establish a time to try to move your bowels every day.  For many people, this is after a cup of coffee or after a meal such as breakfast. - Sit all of the way back on the toilet keeping your back fairly straight and while sitting up, try to rest the tops of your forearms on your upper thighs.   - Raising your feet with a step stool/squatty potty can be helpful to improve the angle that allows your stool to pass through the rectum. - Relax the rectum feeling it bulge toward the toilet water.  If you feel your rectum raising toward your body, you are contracting rather than relaxing. - Breathe in and slowly exhale. "Belly breath" by expanding your belly towards your belly button. Keep belly expanded as you gently direct pressure down and back to the anus.  A low pitched GRRR sound can assist with increasing intra-abdominal pressure.  - Repeat 3-4 times. If unsuccessful, contract the pelvic floor to restore normal tone and get off the toilet.  Avoid excessive straining. - To reduce excessive wiping by teaching your anus to normally contract, place hands on outer aspect of knees and resist knee movement outward.  Hold 5-10 second then place  hands just inside of knees and resist inward movement of knees.  Hold 5 seconds.  Repeat a few times each way.  Go to the ER if unable to pass gas, severe AB pain, unable to hold down food, any shortness of breath of chest pain.

## 2022-08-11 NOTE — Progress Notes (Signed)
I agree with the assessment and plan as outlined by Ms. Steffanie Dunn. Okay to offer ursodiol if LFTs are elevated.

## 2022-08-18 DIAGNOSIS — K219 Gastro-esophageal reflux disease without esophagitis: Secondary | ICD-10-CM | POA: Diagnosis not present

## 2022-08-18 DIAGNOSIS — E785 Hyperlipidemia, unspecified: Secondary | ICD-10-CM | POA: Diagnosis not present

## 2022-08-18 DIAGNOSIS — I10 Essential (primary) hypertension: Secondary | ICD-10-CM | POA: Diagnosis not present

## 2022-08-18 DIAGNOSIS — M1710 Unilateral primary osteoarthritis, unspecified knee: Secondary | ICD-10-CM | POA: Diagnosis not present

## 2022-08-24 DIAGNOSIS — K219 Gastro-esophageal reflux disease without esophagitis: Secondary | ICD-10-CM | POA: Diagnosis not present

## 2022-08-24 DIAGNOSIS — I10 Essential (primary) hypertension: Secondary | ICD-10-CM | POA: Diagnosis not present

## 2022-08-24 DIAGNOSIS — E785 Hyperlipidemia, unspecified: Secondary | ICD-10-CM | POA: Diagnosis not present

## 2022-08-24 DIAGNOSIS — M1710 Unilateral primary osteoarthritis, unspecified knee: Secondary | ICD-10-CM | POA: Diagnosis not present

## 2022-09-01 DIAGNOSIS — I10 Essential (primary) hypertension: Secondary | ICD-10-CM | POA: Diagnosis not present

## 2022-09-01 DIAGNOSIS — I739 Peripheral vascular disease, unspecified: Secondary | ICD-10-CM | POA: Diagnosis not present

## 2022-09-01 DIAGNOSIS — B351 Tinea unguium: Secondary | ICD-10-CM | POA: Diagnosis not present

## 2022-09-01 DIAGNOSIS — L603 Nail dystrophy: Secondary | ICD-10-CM | POA: Diagnosis not present

## 2022-09-14 DIAGNOSIS — E1151 Type 2 diabetes mellitus with diabetic peripheral angiopathy without gangrene: Secondary | ICD-10-CM | POA: Diagnosis not present

## 2022-09-14 DIAGNOSIS — I10 Essential (primary) hypertension: Secondary | ICD-10-CM | POA: Diagnosis not present

## 2022-09-14 DIAGNOSIS — K8050 Calculus of bile duct without cholangitis or cholecystitis without obstruction: Secondary | ICD-10-CM | POA: Diagnosis not present

## 2022-09-14 DIAGNOSIS — K222 Esophageal obstruction: Secondary | ICD-10-CM | POA: Diagnosis not present

## 2022-09-17 DIAGNOSIS — Z23 Encounter for immunization: Secondary | ICD-10-CM | POA: Diagnosis not present

## 2022-09-19 DIAGNOSIS — K219 Gastro-esophageal reflux disease without esophagitis: Secondary | ICD-10-CM | POA: Diagnosis not present

## 2022-09-19 DIAGNOSIS — E785 Hyperlipidemia, unspecified: Secondary | ICD-10-CM | POA: Diagnosis not present

## 2022-09-19 DIAGNOSIS — M1710 Unilateral primary osteoarthritis, unspecified knee: Secondary | ICD-10-CM | POA: Diagnosis not present

## 2022-10-20 DIAGNOSIS — F424 Excoriation (skin-picking) disorder: Secondary | ICD-10-CM | POA: Diagnosis not present

## 2022-10-31 DIAGNOSIS — F424 Excoriation (skin-picking) disorder: Secondary | ICD-10-CM | POA: Diagnosis not present

## 2022-11-01 DIAGNOSIS — A000 Cholera due to Vibrio cholerae 01, biovar cholerae: Secondary | ICD-10-CM | POA: Diagnosis not present

## 2022-11-02 DIAGNOSIS — B351 Tinea unguium: Secondary | ICD-10-CM | POA: Diagnosis not present

## 2022-11-02 DIAGNOSIS — I739 Peripheral vascular disease, unspecified: Secondary | ICD-10-CM | POA: Diagnosis not present

## 2022-11-24 DIAGNOSIS — I10 Essential (primary) hypertension: Secondary | ICD-10-CM | POA: Diagnosis not present

## 2022-11-24 DIAGNOSIS — E785 Hyperlipidemia, unspecified: Secondary | ICD-10-CM | POA: Diagnosis not present

## 2022-11-24 DIAGNOSIS — K219 Gastro-esophageal reflux disease without esophagitis: Secondary | ICD-10-CM | POA: Diagnosis not present

## 2022-11-29 DIAGNOSIS — E1151 Type 2 diabetes mellitus with diabetic peripheral angiopathy without gangrene: Secondary | ICD-10-CM | POA: Diagnosis not present

## 2022-11-29 DIAGNOSIS — M1710 Unilateral primary osteoarthritis, unspecified knee: Secondary | ICD-10-CM | POA: Diagnosis not present

## 2022-11-29 DIAGNOSIS — R2681 Unsteadiness on feet: Secondary | ICD-10-CM | POA: Diagnosis not present

## 2022-11-29 DIAGNOSIS — M6281 Muscle weakness (generalized): Secondary | ICD-10-CM | POA: Diagnosis not present

## 2022-11-30 DIAGNOSIS — K219 Gastro-esophageal reflux disease without esophagitis: Secondary | ICD-10-CM | POA: Diagnosis not present

## 2022-11-30 DIAGNOSIS — I10 Essential (primary) hypertension: Secondary | ICD-10-CM | POA: Diagnosis not present

## 2022-11-30 DIAGNOSIS — E785 Hyperlipidemia, unspecified: Secondary | ICD-10-CM | POA: Diagnosis not present

## 2022-11-30 DIAGNOSIS — R0989 Other specified symptoms and signs involving the circulatory and respiratory systems: Secondary | ICD-10-CM | POA: Diagnosis not present

## 2022-11-30 DIAGNOSIS — U071 COVID-19: Secondary | ICD-10-CM | POA: Diagnosis not present

## 2022-12-01 DIAGNOSIS — K219 Gastro-esophageal reflux disease without esophagitis: Secondary | ICD-10-CM | POA: Diagnosis not present

## 2022-12-01 DIAGNOSIS — I10 Essential (primary) hypertension: Secondary | ICD-10-CM | POA: Diagnosis not present

## 2022-12-01 DIAGNOSIS — M6281 Muscle weakness (generalized): Secondary | ICD-10-CM | POA: Diagnosis not present

## 2022-12-04 DIAGNOSIS — U071 COVID-19: Secondary | ICD-10-CM | POA: Diagnosis not present

## 2022-12-05 DIAGNOSIS — U071 COVID-19: Secondary | ICD-10-CM | POA: Diagnosis not present

## 2022-12-07 DIAGNOSIS — E785 Hyperlipidemia, unspecified: Secondary | ICD-10-CM | POA: Diagnosis not present

## 2022-12-07 DIAGNOSIS — I10 Essential (primary) hypertension: Secondary | ICD-10-CM | POA: Diagnosis not present

## 2022-12-07 DIAGNOSIS — K219 Gastro-esophageal reflux disease without esophagitis: Secondary | ICD-10-CM | POA: Diagnosis not present

## 2022-12-11 DIAGNOSIS — M6281 Muscle weakness (generalized): Secondary | ICD-10-CM | POA: Diagnosis not present

## 2022-12-11 DIAGNOSIS — M1710 Unilateral primary osteoarthritis, unspecified knee: Secondary | ICD-10-CM | POA: Diagnosis not present

## 2022-12-11 DIAGNOSIS — E1151 Type 2 diabetes mellitus with diabetic peripheral angiopathy without gangrene: Secondary | ICD-10-CM | POA: Diagnosis not present

## 2022-12-11 DIAGNOSIS — R2681 Unsteadiness on feet: Secondary | ICD-10-CM | POA: Diagnosis not present

## 2022-12-12 DIAGNOSIS — R2681 Unsteadiness on feet: Secondary | ICD-10-CM | POA: Diagnosis not present

## 2022-12-12 DIAGNOSIS — E1151 Type 2 diabetes mellitus with diabetic peripheral angiopathy without gangrene: Secondary | ICD-10-CM | POA: Diagnosis not present

## 2022-12-12 DIAGNOSIS — M6281 Muscle weakness (generalized): Secondary | ICD-10-CM | POA: Diagnosis not present

## 2022-12-12 DIAGNOSIS — M1710 Unilateral primary osteoarthritis, unspecified knee: Secondary | ICD-10-CM | POA: Diagnosis not present

## 2022-12-13 DIAGNOSIS — E1151 Type 2 diabetes mellitus with diabetic peripheral angiopathy without gangrene: Secondary | ICD-10-CM | POA: Diagnosis not present

## 2022-12-13 DIAGNOSIS — R2681 Unsteadiness on feet: Secondary | ICD-10-CM | POA: Diagnosis not present

## 2022-12-13 DIAGNOSIS — M6281 Muscle weakness (generalized): Secondary | ICD-10-CM | POA: Diagnosis not present

## 2022-12-13 DIAGNOSIS — M1710 Unilateral primary osteoarthritis, unspecified knee: Secondary | ICD-10-CM | POA: Diagnosis not present

## 2022-12-14 DIAGNOSIS — M1710 Unilateral primary osteoarthritis, unspecified knee: Secondary | ICD-10-CM | POA: Diagnosis not present

## 2022-12-14 DIAGNOSIS — E1151 Type 2 diabetes mellitus with diabetic peripheral angiopathy without gangrene: Secondary | ICD-10-CM | POA: Diagnosis not present

## 2022-12-14 DIAGNOSIS — R0981 Nasal congestion: Secondary | ICD-10-CM | POA: Diagnosis not present

## 2022-12-14 DIAGNOSIS — R2681 Unsteadiness on feet: Secondary | ICD-10-CM | POA: Diagnosis not present

## 2022-12-14 DIAGNOSIS — M6281 Muscle weakness (generalized): Secondary | ICD-10-CM | POA: Diagnosis not present

## 2022-12-15 ENCOUNTER — Other Ambulatory Visit: Payer: Self-pay

## 2022-12-15 ENCOUNTER — Emergency Department (HOSPITAL_COMMUNITY): Payer: Medicare Other

## 2022-12-15 ENCOUNTER — Emergency Department (HOSPITAL_COMMUNITY)
Admission: EM | Admit: 2022-12-15 | Discharge: 2022-12-15 | Disposition: A | Discharge status: Skilled Nursing Facility | Payer: Medicare Other | Attending: Emergency Medicine | Admitting: Emergency Medicine

## 2022-12-15 DIAGNOSIS — R059 Cough, unspecified: Secondary | ICD-10-CM | POA: Diagnosis not present

## 2022-12-15 DIAGNOSIS — J9811 Atelectasis: Secondary | ICD-10-CM | POA: Diagnosis not present

## 2022-12-15 DIAGNOSIS — I10 Essential (primary) hypertension: Secondary | ICD-10-CM | POA: Insufficient documentation

## 2022-12-15 DIAGNOSIS — Z87891 Personal history of nicotine dependence: Secondary | ICD-10-CM | POA: Insufficient documentation

## 2022-12-15 DIAGNOSIS — E1151 Type 2 diabetes mellitus with diabetic peripheral angiopathy without gangrene: Secondary | ICD-10-CM | POA: Diagnosis not present

## 2022-12-15 DIAGNOSIS — R2681 Unsteadiness on feet: Secondary | ICD-10-CM | POA: Diagnosis not present

## 2022-12-15 DIAGNOSIS — J189 Pneumonia, unspecified organism: Secondary | ICD-10-CM | POA: Diagnosis not present

## 2022-12-15 DIAGNOSIS — Z7401 Bed confinement status: Secondary | ICD-10-CM | POA: Diagnosis not present

## 2022-12-15 DIAGNOSIS — R0981 Nasal congestion: Secondary | ICD-10-CM | POA: Diagnosis present

## 2022-12-15 DIAGNOSIS — J811 Chronic pulmonary edema: Secondary | ICD-10-CM | POA: Diagnosis not present

## 2022-12-15 DIAGNOSIS — R531 Weakness: Secondary | ICD-10-CM | POA: Diagnosis not present

## 2022-12-15 DIAGNOSIS — Z8673 Personal history of transient ischemic attack (TIA), and cerebral infarction without residual deficits: Secondary | ICD-10-CM | POA: Insufficient documentation

## 2022-12-15 DIAGNOSIS — M1710 Unilateral primary osteoarthritis, unspecified knee: Secondary | ICD-10-CM | POA: Diagnosis not present

## 2022-12-15 DIAGNOSIS — M6281 Muscle weakness (generalized): Secondary | ICD-10-CM | POA: Diagnosis not present

## 2022-12-15 MED ORDER — DOXYCYCLINE HYCLATE 100 MG PO CAPS: 100.0000 mg | ORAL_CAPSULE | Freq: Two times a day (BID) | ORAL | 0 refills | Status: AC

## 2022-12-15 MED ORDER — DOXYCYCLINE HYCLATE 100 MG PO CAPS: 100.0000 mg | ORAL_CAPSULE | Freq: Two times a day (BID) | ORAL | 0 refills | Status: DC

## 2022-12-15 MED ORDER — DOXYCYCLINE HYCLATE 100 MG PO TABS
100.0000 mg | ORAL_TABLET | Freq: Once | ORAL | Status: AC
  Administered 2022-12-15: 100 mg via ORAL
  Filled 2022-12-15: qty 1

## 2022-12-15 NOTE — ED Provider Notes (Signed)
[facility] Emergency Department Provider Note MRN:  [id number]  Arrival date & time: 12/15/22     Chief Complaint   Nasal Congestion   History of Present Illness   Jill Hernandez is a 89 y.o. year-old female with a history of hypertension presenting to the ED with chief complaint of nasal congestion.  Continued nasal and chest congestion over the past several days.  Recently diagnosed with COVID-19.  Had some possibly low oxygen numbers at care facility, sent here for evaluation.  Review of Systems  A thorough review of systems was obtained and all systems are negative except as noted in the HPI and PMH.   Patient's Health History    Past Medical History:  Diagnosis Date   GERD (gastroesophageal reflux disease)    Hyperlipemia    Hypertension    TIA (transient ischemic attack)     Past Surgical History:  Procedure Laterality Date   ABDOMINAL HYSTERECTOMY     BALLOON DILATION N/A 07/07/2022   Procedure: BALLOON DILATION;  Surgeon: Gatha Mayer, MD;  Location: [place];  Service: Gastroenterology;  Laterality: N/A;   ERCP N/A 07/07/2022   Procedure: ENDOSCOPIC RETROGRADE CHOLANGIOPANCREATOGRAPHY (ERCP);  Surgeon: Gatha Mayer, MD;  Location: [place];  Service: Gastroenterology;  Laterality: N/A;   ESOPHAGOGASTRODUODENOSCOPY (EGD) WITH PROPOFOL N/A 07/07/2022   Procedure: ESOPHAGOGASTRODUODENOSCOPY (EGD) WITH PROPOFOL;  Surgeon: Gatha Mayer, MD;  Location: [place];  Service: Gastroenterology;  Laterality: N/A;   REMOVAL OF STONES  07/07/2022   Procedure: REMOVAL OF STONES;  Surgeon: Gatha Mayer, MD;  Location: [place];  Service: Gastroenterology;;   Joan Mayans  07/07/2022   Procedure: Joan Mayans;  Surgeon: Gatha Mayer, MD;  Location: [facility] ENDOSCOPY;  Service: Gastroenterology;;   TONSILLECTOMY      Family History  Problem Relation Age of Onset   Hypertension Mother    Hyperlipidemia Father    Hypertension  Father     Social History   Socioeconomic History   Marital status: Married    Spouse name: Not on file   Number of children: Not on file   Years of education: Not on file   Highest education level: Not on file  Occupational History   Not on file  Tobacco Use   Smoking status: Former    Years: 20.00    Types: Cigarettes    Quit date: 04/30/1963    Years since quitting: 59.6   Smokeless tobacco: Never  Vaping Use   Vaping Use: Never used  Substance and Sexual Activity   Alcohol use: No   Drug use: No   Sexual activity: Not on file  Other Topics Concern   Not on file  Social History Narrative   Not on file   Social Determinants of Health   Financial Resource Strain: Not on file  Food Insecurity: Not on file  Transportation Needs: Not on file  Physical Activity: Not on file  Stress: Not on file  Social Connections: Not on file  Intimate Partner Violence: Not on file     Physical Exam   Vitals:   12/15/22 0115 12/15/22 0130  BP: (!) 133/58 (!) 148/55  Pulse: 65 63  Resp:  18  Temp:    SpO2: 98% 92%    CONSTITUTIONAL: Well-appearing, NAD NEURO/PSYCH:  Alert and oriented x 3, no focal deficits EYES:  eyes equal and reactive ENT/NECK:  no LAD, no JVD CARDIO: Regular rate, well-perfused, normal S1 and S2 PULM:  CTAB no wheezing or  rhonchi GI/GU:  non-distended, non-tender MSK/SPINE:  No gross deformities, no edema SKIN:  no rash, atraumatic   *Additional and/or pertinent findings included in MDM below  Diagnostic and Interventional Summary    EKG Interpretation  Date/Time:    Ventricular Rate:    PR Interval:    QRS Duration:   QT Interval:    QTC Calculation:   R Axis:     Text Interpretation:         Labs Reviewed - No data to display  DG Chest 2 View  Final Result      Medications  doxycycline (VIBRA-TABS) tablet 100 mg (has no administration in time range)     Procedures  /  Critical Care Procedures  ED Course and Medical Decision  Making  Initial Impression and Ddx Differential diagnosis includes bronchitis, bacterial pneumonia, pneumothorax.  Awaiting chest x-ray.  Overall she is sitting comfortably in no acute distress with clear lungs, normal vital signs, no hypoxia.  Suspect will be able to be discharged possibly with antibiotics.  Past medical/surgical history that increases complexity of ED encounter: Advanced age, hypertension, TIA  Interpretation of Diagnostics I personally reviewed the Chest Xray and my interpretation is as follows: No pneumothorax, possible infiltrate noted    Patient Reassessment and Ultimate Disposition/Management     Patient continues to sit comfortably in no acute distress with normal vital signs, no hypoxia, no increased work of breathing.  No fever, definitely no signs of sepsis.  Given the duration of cough and the x-ray findings we will treat for community-acquired pneumonia, no indication for further testing or admission, appropriate for discharge.  Patient management required discussion with the following services or consulting groups:  None  Complexity of Problems Addressed Acute illness or injury that poses threat of life of bodily function  Additional Data Reviewed and Analyzed Further history obtained from: None  Additional Factors Impacting ED Encounter Risk Prescriptions  Barth Kirks. Sedonia Small, [place] [email or web address]  Final Clinical Impressions(s) / ED Diagnoses     ICD-10-CM   1. Cough, unspecified type  R05.9     2. Community acquired pneumonia, unspecified laterality  J18.9       ED Discharge Orders          Ordered    doxycycline (VIBRAMYCIN) 100 MG capsule  2 times daily        12/15/22 0228             Discharge Instructions Discussed with and Provided to Patient:     Discharge Instructions      You were evaluated in the Emergency Department and after careful evaluation, we did not  find any emergent condition requiring admission or further testing in the hospital.  Your exam/testing today is overall reassuring.  Your x-ray showing some evidence to suggest pneumonia.  Take the doxycycline antibiotic as directed.  Please return to the Emergency Department if you experience any worsening of your condition.   Thank you for allowing [place] to be a part of your care.       Maudie Flakes, MD 12/15/22 [phone]

## 2022-12-15 NOTE — ED Triage Notes (Signed)
Patient bib [facility] from [facility] [facility] for complaints of cough and congestion. Staff at the facility called EMS for low oxygen saturations but on their arrival she was satting 96% but when she talks her oxygen drops slightly. Patient states when she coughs green mucous comes up. Patient tested positive for covid on January 3rd and the cough and congestion have progressed since then. [facility] reports VSS. Oxygen 96-98% Pulse 70 BP 160/70 Respirations 18.

## 2022-12-15 NOTE — Discharge Instructions (Signed)
You were evaluated in the Emergency Department and after careful evaluation, we did not find any emergent condition requiring admission or further testing in the hospital.  Your exam/testing today is overall reassuring.  Your x-ray showing some evidence to suggest pneumonia.  Take the doxycycline antibiotic as directed.  Please return to the Emergency Department if you experience any worsening of your condition.   Thank you for allowing [place] to be a part of your care.

## 2022-12-17 DIAGNOSIS — I469 Cardiac arrest, cause unspecified: Secondary | ICD-10-CM | POA: Diagnosis not present

## 2022-12-28 DEATH — deceased
